# Patient Record
Sex: Female | Born: 1935 | Race: White | Hispanic: No | Smoking: Never smoker
Health system: Southern US, Community
[De-identification: ages and names within clinical notes are randomized; demographics above are authoritative.]

## PROBLEM LIST (undated history)

## (undated) DIAGNOSIS — E669 Obesity, unspecified: Secondary | ICD-10-CM

## (undated) DIAGNOSIS — D696 Thrombocytopenia, unspecified: Secondary | ICD-10-CM

## (undated) DIAGNOSIS — G4730 Sleep apnea, unspecified: Secondary | ICD-10-CM

## (undated) DIAGNOSIS — D649 Anemia, unspecified: Secondary | ICD-10-CM

## (undated) DIAGNOSIS — M1990 Unspecified osteoarthritis, unspecified site: Secondary | ICD-10-CM

## (undated) DIAGNOSIS — M069 Rheumatoid arthritis, unspecified: Secondary | ICD-10-CM

## (undated) DIAGNOSIS — K5792 Diverticulitis of intestine, part unspecified, without perforation or abscess without bleeding: Secondary | ICD-10-CM

## (undated) DIAGNOSIS — I10 Essential (primary) hypertension: Secondary | ICD-10-CM

## (undated) DIAGNOSIS — D126 Benign neoplasm of colon, unspecified: Secondary | ICD-10-CM

## (undated) DIAGNOSIS — I509 Heart failure, unspecified: Secondary | ICD-10-CM

## (undated) DIAGNOSIS — K219 Gastro-esophageal reflux disease without esophagitis: Secondary | ICD-10-CM

## (undated) DIAGNOSIS — E11319 Type 2 diabetes mellitus with unspecified diabetic retinopathy without macular edema: Secondary | ICD-10-CM

## (undated) DIAGNOSIS — Z87442 Personal history of urinary calculi: Secondary | ICD-10-CM

## (undated) DIAGNOSIS — F419 Anxiety disorder, unspecified: Secondary | ICD-10-CM

## (undated) HISTORY — PX: CATARACT EXTRACTION: SUR2

## (undated) HISTORY — PX: APPENDECTOMY: SHX54

## (undated) HISTORY — PX: CHOLECYSTECTOMY: SHX55

## (undated) HISTORY — DX: Benign neoplasm of colon, unspecified: D12.6

## (undated) HISTORY — DX: Type 2 diabetes mellitus with unspecified diabetic retinopathy without macular edema: E11.319

## (undated) HISTORY — DX: Diverticulitis of intestine, part unspecified, without perforation or abscess without bleeding: K57.92

## (undated) HISTORY — DX: Sleep apnea, unspecified: G47.30

## (undated) HISTORY — DX: Obesity, unspecified: E66.9

## (undated) HISTORY — DX: Anemia, unspecified: D64.9

## (undated) HISTORY — DX: Thrombocytopenia, unspecified: D69.6

## (undated) HISTORY — PX: TONSILLECTOMY: SUR1361

---

## 2001-04-17 ENCOUNTER — Ambulatory Visit (HOSPITAL_COMMUNITY): Admission: RE | Admit: 2001-04-17 | Discharge: 2001-04-17 | Payer: Self-pay | Admitting: Family Medicine

## 2001-04-17 ENCOUNTER — Encounter: Payer: Self-pay | Admitting: Family Medicine

## 2002-05-29 ENCOUNTER — Encounter: Payer: Self-pay | Admitting: Family Medicine

## 2002-05-29 ENCOUNTER — Ambulatory Visit (HOSPITAL_COMMUNITY): Admission: RE | Admit: 2002-05-29 | Discharge: 2002-05-29 | Payer: Self-pay | Admitting: Family Medicine

## 2002-06-13 ENCOUNTER — Ambulatory Visit: Admission: RE | Admit: 2002-06-13 | Discharge: 2002-06-13 | Payer: Self-pay | Admitting: Family Medicine

## 2005-08-26 ENCOUNTER — Ambulatory Visit (HOSPITAL_COMMUNITY): Admission: RE | Admit: 2005-08-26 | Discharge: 2005-08-26 | Payer: Self-pay | Admitting: Family Medicine

## 2006-07-25 ENCOUNTER — Ambulatory Visit (HOSPITAL_COMMUNITY): Admission: RE | Admit: 2006-07-25 | Discharge: 2006-07-25 | Payer: Self-pay | Admitting: Family Medicine

## 2006-09-26 ENCOUNTER — Ambulatory Visit (HOSPITAL_COMMUNITY): Admission: RE | Admit: 2006-09-26 | Discharge: 2006-09-26 | Payer: Self-pay | Admitting: Family Medicine

## 2007-08-09 HISTORY — PX: COLONOSCOPY: SHX174

## 2007-08-29 ENCOUNTER — Ambulatory Visit: Payer: Self-pay | Admitting: Internal Medicine

## 2007-08-29 ENCOUNTER — Ambulatory Visit (HOSPITAL_COMMUNITY): Admission: RE | Admit: 2007-08-29 | Discharge: 2007-08-29 | Payer: Self-pay | Admitting: Internal Medicine

## 2007-08-29 ENCOUNTER — Encounter: Payer: Self-pay | Admitting: Internal Medicine

## 2007-11-01 ENCOUNTER — Ambulatory Visit (HOSPITAL_COMMUNITY): Admission: RE | Admit: 2007-11-01 | Discharge: 2007-11-01 | Payer: Self-pay | Admitting: Family Medicine

## 2007-11-28 ENCOUNTER — Ambulatory Visit (HOSPITAL_COMMUNITY): Admission: RE | Admit: 2007-11-28 | Discharge: 2007-11-28 | Payer: Self-pay | Admitting: Family Medicine

## 2008-05-27 ENCOUNTER — Ambulatory Visit (HOSPITAL_COMMUNITY): Admission: RE | Admit: 2008-05-27 | Discharge: 2008-05-27 | Payer: Self-pay | Admitting: Ophthalmology

## 2008-09-22 ENCOUNTER — Emergency Department (HOSPITAL_COMMUNITY): Admission: EM | Admit: 2008-09-22 | Discharge: 2008-09-22 | Payer: Self-pay | Admitting: Emergency Medicine

## 2008-11-20 ENCOUNTER — Ambulatory Visit (HOSPITAL_COMMUNITY): Admission: RE | Admit: 2008-11-20 | Discharge: 2008-11-20 | Payer: Self-pay | Admitting: Family Medicine

## 2009-12-15 ENCOUNTER — Ambulatory Visit (HOSPITAL_COMMUNITY): Admission: RE | Admit: 2009-12-15 | Discharge: 2009-12-15 | Payer: Self-pay | Admitting: Family Medicine

## 2010-06-12 ENCOUNTER — Other Ambulatory Visit (HOSPITAL_COMMUNITY): Payer: Self-pay | Admitting: Family Medicine

## 2010-06-12 DIAGNOSIS — M8580 Other specified disorders of bone density and structure, unspecified site: Secondary | ICD-10-CM

## 2010-06-17 ENCOUNTER — Encounter (HOSPITAL_COMMUNITY): Payer: Self-pay

## 2010-06-17 ENCOUNTER — Ambulatory Visit (HOSPITAL_COMMUNITY)
Admission: RE | Admit: 2010-06-17 | Discharge: 2010-06-17 | Disposition: A | Discharge status: Home / Self Care | Payer: Medicare Other | Source: Ambulatory Visit | Attending: Family Medicine | Admitting: Family Medicine

## 2010-06-17 DIAGNOSIS — Z780 Asymptomatic menopausal state: Secondary | ICD-10-CM | POA: Insufficient documentation

## 2010-06-17 DIAGNOSIS — Z13820 Encounter for screening for osteoporosis: Secondary | ICD-10-CM | POA: Insufficient documentation

## 2010-06-17 DIAGNOSIS — M8580 Other specified disorders of bone density and structure, unspecified site: Secondary | ICD-10-CM

## 2010-06-17 HISTORY — DX: Heart failure, unspecified: I50.9

## 2010-06-17 HISTORY — DX: Essential (primary) hypertension: I10

## 2010-06-24 ENCOUNTER — Emergency Department (HOSPITAL_COMMUNITY): Payer: Medicare Other

## 2010-06-24 ENCOUNTER — Emergency Department (HOSPITAL_COMMUNITY)
Admission: EM | Admit: 2010-06-24 | Discharge: 2010-06-24 | Disposition: A | Discharge status: Home / Self Care | Payer: Medicare Other | Attending: Emergency Medicine | Admitting: Emergency Medicine

## 2010-06-24 DIAGNOSIS — R110 Nausea: Secondary | ICD-10-CM | POA: Insufficient documentation

## 2010-06-24 DIAGNOSIS — R109 Unspecified abdominal pain: Secondary | ICD-10-CM | POA: Insufficient documentation

## 2010-06-24 DIAGNOSIS — E7800 Pure hypercholesterolemia, unspecified: Secondary | ICD-10-CM | POA: Insufficient documentation

## 2010-06-24 DIAGNOSIS — I10 Essential (primary) hypertension: Secondary | ICD-10-CM | POA: Insufficient documentation

## 2010-06-24 DIAGNOSIS — E1169 Type 2 diabetes mellitus with other specified complication: Secondary | ICD-10-CM | POA: Insufficient documentation

## 2010-06-24 DIAGNOSIS — Z79899 Other long term (current) drug therapy: Secondary | ICD-10-CM | POA: Insufficient documentation

## 2010-06-24 DIAGNOSIS — Z87442 Personal history of urinary calculi: Secondary | ICD-10-CM | POA: Insufficient documentation

## 2010-06-24 LAB — URINALYSIS, ROUTINE W REFLEX MICROSCOPIC
Bilirubin Urine: NEGATIVE
Nitrite: NEGATIVE
Specific Gravity, Urine: >1.03 — ABNORMAL HIGH (ref 1.005–1.030)
Urobilinogen, UA: 0.2 mg/dL (ref 0.0–1.0)

## 2010-06-24 LAB — COMPREHENSIVE METABOLIC PANEL
AST: 25 U/L (ref 0–37)
BUN: 15 mg/dL (ref 6–23)
CO2: 27 mEq/L (ref 19–32)
Calcium: 8.9 mg/dL (ref 8.4–10.5)
Chloride: 106 mEq/L (ref 96–112)
Creatinine, Ser: 0.79 mg/dL (ref 0.4–1.2)
GFR calc Af Amer: >60 mL/min (ref >60)
GFR calc non Af Amer: >60 mL/min (ref >60)
Glucose, Bld: 157 mg/dL — ABNORMAL HIGH (ref 70–99)
Total Bilirubin: 0.7 mg/dL (ref 0.3–1.2)

## 2010-06-24 LAB — CBC
HCT: 37.4 % (ref 36.0–46.0)
Hemoglobin: 12.5 g/dL (ref 12.0–15.0)
MCH: 29.1 pg (ref 26.0–34.0)
MCHC: 33.4 g/dL (ref 30.0–36.0)
MCV: 87.2 fL (ref 78.0–100.0)
RBC: 4.29 MIL/uL (ref 3.87–5.11)

## 2010-06-24 LAB — DIFFERENTIAL
Lymphocytes Relative: 18 % (ref 12–46)
Lymphs Abs: 1.4 10*3/uL (ref 0.7–4.0)
Monocytes Absolute: 0.3 10*3/uL (ref 0.1–1.0)
Monocytes Relative: 4 % (ref 3–12)
Neutro Abs: 5.9 10*3/uL (ref 1.7–7.7)
Neutrophils Relative %: 76 % (ref 43–77)

## 2010-08-18 LAB — URINE CULTURE: Colony Count: NO GROWTH

## 2010-08-18 LAB — COMPREHENSIVE METABOLIC PANEL
ALT: 25 U/L (ref 0–35)
AST: 23 U/L (ref 0–37)
Albumin: 3.7 g/dL (ref 3.5–5.2)
Alkaline Phosphatase: 51 U/L (ref 39–117)
Chloride: 101 mEq/L (ref 96–112)
GFR calc Af Amer: >60 mL/min (ref >60)
Potassium: 4.1 mEq/L (ref 3.5–5.1)
Sodium: 136 mEq/L (ref 135–145)
Total Bilirubin: 0.6 mg/dL (ref 0.3–1.2)

## 2010-08-18 LAB — URINALYSIS, ROUTINE W REFLEX MICROSCOPIC
Bilirubin Urine: NEGATIVE
Nitrite: NEGATIVE
Protein, ur: NEGATIVE mg/dL
Urobilinogen, UA: 0.2 mg/dL (ref 0.0–1.0)

## 2010-08-18 LAB — CBC
Platelets: 116 10*3/uL — ABNORMAL LOW (ref 150–400)
WBC: 10.3 10*3/uL (ref 4.0–10.5)

## 2010-08-18 LAB — DIFFERENTIAL
Basophils Absolute: 0 10*3/uL (ref 0.0–0.1)
Basophils Relative: 0 % (ref 0–1)
Eosinophils Relative: 0 % (ref 0–5)
Lymphocytes Relative: 11 % — ABNORMAL LOW (ref 12–46)
Monocytes Absolute: 0.4 10*3/uL (ref 0.1–1.0)

## 2010-08-24 LAB — BASIC METABOLIC PANEL
BUN: 18 mg/dL (ref 6–23)
GFR calc non Af Amer: >60 mL/min (ref >60)
Glucose, Bld: 196 mg/dL — ABNORMAL HIGH (ref 70–99)
Potassium: 4 mEq/L (ref 3.5–5.1)

## 2010-09-22 NOTE — Op Note (Signed)
NAME:  PAYSON, CRUMBY                ACCOUNT NO.:  [id number]   MEDICAL RECORD NO.:  [id number]          PATIENT TYPE:  AMB   LOCATION:  DAY                           FACILITY:  [facility]   PHYSICIAN:  R. Roetta Sessions, M.D. DATE OF BIRTH:  1935-02-28   DATE OF PROCEDURE:  08/29/2007  DATE OF DISCHARGE:                               OPERATIVE REPORT   INDICATIONS FOR PROCEDURE:  A 76 year old lady who reportedly underwent  colonoscopy with polypectomy in [place], [place], 11 years ago.  She  has no lower GI tract symptoms.  She has not had a colonoscopy since  that time.  There is no family history of colon cancer.  Colonoscopy is  now being done as a surveillance maneuver.  This approach has been  discussed with the patient at length.  Potential risks, benefits, and  alternatives have been reviewed and questions answered.  She is  agreeable.  Please see documentation in medical record.   PROCEDURE NOTE:  O2 saturation, blood pressure, and pulse oximetry  monitored throughout the entire procedure.   CONSCIOUS SEDATION:  Versed 5 mg IV, Demerol 75 mg IV in divided doses.   INSTRUMENT:  Pentax video chip system.   FINDINGS:  Digital rectal exam revealed no abnormalities.   ENDOSCOPIC FINDINGS:  The prep was good.  Colon:  Colonic mucosa was surveyed from the rectosigmoid junction  through the left, transverse and right colon to the appendiceal orifice,  ileocecal valve and cecum.  These structures were well seen and  photographed for the record.  From this level, the scope was slowly  withdrawn.  All previously mentioned mucosal surfaces were again seen.  The patient had relatively deep haustrations.  The patient had  pancolonic diverticula.  There was a 5-mm polyp at the splenic flexure  which was cold snared, destroyed/residual tissue lost with this  maneuver.  No sample for the pathologist.  There was a diminutive polyp  just distal to the ileocecal valve which was cold  biopsied/removed.  The  remainder of the colonic mucosa appeared normal.  The scope was pulled  down to the rectum where thorough examination of the rectal mucosa  including retroflex view of the anal verge demonstrated no  abnormalities.  The patient tolerated the procedure well and and was  reactive in endoscopy.   IMPRESSION:  1. Normal rectum.  2. Pancolonic diverticula.  3. Polyp at splenic flexure removed with snare but no tissue for the      pathologist, diminutive polyp just distal to the ileocecal valve,      cold biopsy/remainder of colonic mucosa appeared normal.   RECOMMENDATIONS:  1. Diverticulosis literature provided to Ms. Saal.  2. Follow up on path.  3. Make further recommendations in terms of her next colonoscopy based      on path findings.  Again, we have no information on the histology,      size, etc., of any polyps previously removed.      Jonathon Bellows, M.D.  Electronically Signed     RMR/MEDQ  D:  08/29/2007  T:  08/29/2007  Job:  [id number]   cc:   Lorin Picket A. Gerda Diss, MD  Fax: [phone]

## 2010-09-24 ENCOUNTER — Encounter (HOSPITAL_COMMUNITY): Payer: Self-pay

## 2010-09-24 ENCOUNTER — Ambulatory Visit (HOSPITAL_COMMUNITY)
Admission: RE | Admit: 2010-09-24 | Discharge: 2010-09-24 | Disposition: A | Discharge status: Home / Self Care | Payer: Medicare Other | Source: Ambulatory Visit | Attending: Family Medicine | Admitting: Family Medicine

## 2010-09-24 ENCOUNTER — Other Ambulatory Visit: Payer: Self-pay | Admitting: Family Medicine

## 2010-09-24 DIAGNOSIS — R0600 Dyspnea, unspecified: Secondary | ICD-10-CM

## 2010-09-24 DIAGNOSIS — R0609 Other forms of dyspnea: Secondary | ICD-10-CM | POA: Insufficient documentation

## 2010-09-24 DIAGNOSIS — R0989 Other specified symptoms and signs involving the circulatory and respiratory systems: Secondary | ICD-10-CM | POA: Insufficient documentation

## 2010-09-24 DIAGNOSIS — I10 Essential (primary) hypertension: Secondary | ICD-10-CM | POA: Insufficient documentation

## 2011-05-24 DIAGNOSIS — E11339 Type 2 diabetes mellitus with moderate nonproliferative diabetic retinopathy without macular edema: Secondary | ICD-10-CM | POA: Diagnosis not present

## 2011-05-24 DIAGNOSIS — E11311 Type 2 diabetes mellitus with unspecified diabetic retinopathy with macular edema: Secondary | ICD-10-CM | POA: Diagnosis not present

## 2011-05-24 DIAGNOSIS — E1139 Type 2 diabetes mellitus with other diabetic ophthalmic complication: Secondary | ICD-10-CM | POA: Diagnosis not present

## 2011-05-24 DIAGNOSIS — H35359 Cystoid macular degeneration, unspecified eye: Secondary | ICD-10-CM | POA: Diagnosis not present

## 2011-05-31 DIAGNOSIS — J069 Acute upper respiratory infection, unspecified: Secondary | ICD-10-CM | POA: Diagnosis not present

## 2011-05-31 DIAGNOSIS — J40 Bronchitis, not specified as acute or chronic: Secondary | ICD-10-CM | POA: Diagnosis not present

## 2011-06-10 DIAGNOSIS — J40 Bronchitis, not specified as acute or chronic: Secondary | ICD-10-CM | POA: Diagnosis not present

## 2011-06-10 DIAGNOSIS — E785 Hyperlipidemia, unspecified: Secondary | ICD-10-CM | POA: Diagnosis not present

## 2011-06-10 DIAGNOSIS — E119 Type 2 diabetes mellitus without complications: Secondary | ICD-10-CM | POA: Diagnosis not present

## 2011-06-21 DIAGNOSIS — E11311 Type 2 diabetes mellitus with unspecified diabetic retinopathy with macular edema: Secondary | ICD-10-CM | POA: Diagnosis not present

## 2011-07-05 DIAGNOSIS — E11339 Type 2 diabetes mellitus with moderate nonproliferative diabetic retinopathy without macular edema: Secondary | ICD-10-CM | POA: Diagnosis not present

## 2011-07-05 DIAGNOSIS — H35359 Cystoid macular degeneration, unspecified eye: Secondary | ICD-10-CM | POA: Diagnosis not present

## 2011-07-05 DIAGNOSIS — E1139 Type 2 diabetes mellitus with other diabetic ophthalmic complication: Secondary | ICD-10-CM | POA: Diagnosis not present

## 2011-07-05 DIAGNOSIS — E11311 Type 2 diabetes mellitus with unspecified diabetic retinopathy with macular edema: Secondary | ICD-10-CM | POA: Diagnosis not present

## 2011-07-23 ENCOUNTER — Encounter (HOSPITAL_COMMUNITY): Payer: Self-pay

## 2011-07-23 ENCOUNTER — Emergency Department (HOSPITAL_COMMUNITY): Payer: Medicare Other

## 2011-07-23 ENCOUNTER — Inpatient Hospital Stay (HOSPITAL_COMMUNITY)
Admission: EM | Admit: 2011-07-23 | Discharge: 2011-07-26 | DRG: 378 | Disposition: A | Discharge status: Home / Self Care | Payer: Medicare Other | Attending: Internal Medicine | Admitting: Internal Medicine

## 2011-07-23 DIAGNOSIS — M47817 Spondylosis without myelopathy or radiculopathy, lumbosacral region: Secondary | ICD-10-CM | POA: Diagnosis present

## 2011-07-23 DIAGNOSIS — Z87442 Personal history of urinary calculi: Secondary | ICD-10-CM

## 2011-07-23 DIAGNOSIS — D649 Anemia, unspecified: Secondary | ICD-10-CM

## 2011-07-23 DIAGNOSIS — I10 Essential (primary) hypertension: Secondary | ICD-10-CM | POA: Diagnosis not present

## 2011-07-23 DIAGNOSIS — K5792 Diverticulitis of intestine, part unspecified, without perforation or abscess without bleeding: Secondary | ICD-10-CM | POA: Diagnosis present

## 2011-07-23 DIAGNOSIS — R935 Abnormal findings on diagnostic imaging of other abdominal regions, including retroperitoneum: Secondary | ICD-10-CM | POA: Diagnosis not present

## 2011-07-23 DIAGNOSIS — M47814 Spondylosis without myelopathy or radiculopathy, thoracic region: Secondary | ICD-10-CM | POA: Diagnosis present

## 2011-07-23 DIAGNOSIS — D62 Acute posthemorrhagic anemia: Secondary | ICD-10-CM | POA: Diagnosis not present

## 2011-07-23 DIAGNOSIS — K219 Gastro-esophageal reflux disease without esophagitis: Secondary | ICD-10-CM | POA: Diagnosis present

## 2011-07-23 DIAGNOSIS — D126 Benign neoplasm of colon, unspecified: Secondary | ICD-10-CM | POA: Diagnosis present

## 2011-07-23 DIAGNOSIS — E785 Hyperlipidemia, unspecified: Secondary | ICD-10-CM | POA: Diagnosis present

## 2011-07-23 DIAGNOSIS — Z79899 Other long term (current) drug therapy: Secondary | ICD-10-CM

## 2011-07-23 DIAGNOSIS — K635 Polyp of colon: Secondary | ICD-10-CM | POA: Diagnosis present

## 2011-07-23 DIAGNOSIS — N179 Acute kidney failure, unspecified: Secondary | ICD-10-CM | POA: Diagnosis present

## 2011-07-23 DIAGNOSIS — I509 Heart failure, unspecified: Secondary | ICD-10-CM | POA: Diagnosis present

## 2011-07-23 DIAGNOSIS — Z6836 Body mass index (BMI) 36.0-36.9, adult: Secondary | ICD-10-CM | POA: Diagnosis not present

## 2011-07-23 DIAGNOSIS — K5732 Diverticulitis of large intestine without perforation or abscess without bleeding: Secondary | ICD-10-CM | POA: Diagnosis not present

## 2011-07-23 DIAGNOSIS — R112 Nausea with vomiting, unspecified: Secondary | ICD-10-CM | POA: Diagnosis present

## 2011-07-23 DIAGNOSIS — K625 Hemorrhage of anus and rectum: Secondary | ICD-10-CM | POA: Diagnosis not present

## 2011-07-23 DIAGNOSIS — K922 Gastrointestinal hemorrhage, unspecified: Secondary | ICD-10-CM | POA: Diagnosis not present

## 2011-07-23 DIAGNOSIS — K5712 Diverticulitis of small intestine without perforation or abscess without bleeding: Secondary | ICD-10-CM | POA: Diagnosis not present

## 2011-07-23 DIAGNOSIS — M069 Rheumatoid arthritis, unspecified: Secondary | ICD-10-CM | POA: Diagnosis present

## 2011-07-23 DIAGNOSIS — Z7982 Long term (current) use of aspirin: Secondary | ICD-10-CM

## 2011-07-23 DIAGNOSIS — D696 Thrombocytopenia, unspecified: Secondary | ICD-10-CM | POA: Diagnosis not present

## 2011-07-23 DIAGNOSIS — R1032 Left lower quadrant pain: Secondary | ICD-10-CM | POA: Diagnosis not present

## 2011-07-23 DIAGNOSIS — K644 Residual hemorrhoidal skin tags: Secondary | ICD-10-CM | POA: Diagnosis present

## 2011-07-23 DIAGNOSIS — Z794 Long term (current) use of insulin: Secondary | ICD-10-CM

## 2011-07-23 DIAGNOSIS — I498 Other specified cardiac arrhythmias: Secondary | ICD-10-CM | POA: Diagnosis not present

## 2011-07-23 DIAGNOSIS — R001 Bradycardia, unspecified: Secondary | ICD-10-CM | POA: Diagnosis present

## 2011-07-23 DIAGNOSIS — K5733 Diverticulitis of large intestine without perforation or abscess with bleeding: Secondary | ICD-10-CM | POA: Diagnosis present

## 2011-07-23 DIAGNOSIS — E669 Obesity, unspecified: Secondary | ICD-10-CM | POA: Diagnosis present

## 2011-07-23 DIAGNOSIS — E119 Type 2 diabetes mellitus without complications: Secondary | ICD-10-CM | POA: Diagnosis not present

## 2011-07-23 DIAGNOSIS — D259 Leiomyoma of uterus, unspecified: Secondary | ICD-10-CM | POA: Diagnosis not present

## 2011-07-23 HISTORY — DX: Rheumatoid arthritis, unspecified: M06.9

## 2011-07-23 HISTORY — DX: Gastro-esophageal reflux disease without esophagitis: K21.9

## 2011-07-23 LAB — HEPATIC FUNCTION PANEL
ALT: 10 U/L (ref 0–35)
Albumin: 3.2 g/dL — ABNORMAL LOW (ref 3.5–5.2)
Alkaline Phosphatase: 51 U/L (ref 39–117)
Total Bilirubin: 0.3 mg/dL (ref 0.3–1.2)
Total Protein: 5.9 g/dL — ABNORMAL LOW (ref 6.0–8.3)

## 2011-07-23 LAB — DIFFERENTIAL
Basophils Absolute: 0 10*3/uL (ref 0.0–0.1)
Basophils Relative: 0 % (ref 0–1)
Neutro Abs: 8.5 10*3/uL — ABNORMAL HIGH (ref 1.7–7.7)
Neutrophils Relative %: 79 % — ABNORMAL HIGH (ref 43–77)

## 2011-07-23 LAB — CBC
Hemoglobin: 9.3 g/dL — ABNORMAL LOW (ref 12.0–15.0)
MCHC: 33 g/dL (ref 30.0–36.0)
Platelets: 158 10*3/uL (ref 150–400)
RDW: 14.3 % (ref 11.5–15.5)

## 2011-07-23 LAB — GLUCOSE, CAPILLARY
Glucose-Capillary: 90 mg/dL (ref 70–99)
Glucose-Capillary: 104 mg/dL — ABNORMAL HIGH (ref 70–99)

## 2011-07-23 LAB — BASIC METABOLIC PANEL
Chloride: 100 mEq/L (ref 96–112)
GFR calc Af Amer: 55 mL/min — ABNORMAL LOW (ref >90)
GFR calc non Af Amer: 47 mL/min — ABNORMAL LOW (ref >90)
Potassium: 4.6 mEq/L (ref 3.5–5.1)

## 2011-07-23 LAB — OCCULT BLOOD, POC DEVICE: Fecal Occult Bld: POSITIVE

## 2011-07-23 LAB — HEMOGLOBIN AND HEMATOCRIT, BLOOD
HCT: 26.8 % — ABNORMAL LOW (ref 36.0–46.0)
HCT: 24.3 % — ABNORMAL LOW (ref 36.0–46.0)
Hemoglobin: 8.8 g/dL — ABNORMAL LOW (ref 12.0–15.0)
Hemoglobin: 8.1 g/dL — ABNORMAL LOW (ref 12.0–15.0)

## 2011-07-23 LAB — TYPE AND SCREEN: ABO/RH(D): A NEG

## 2011-07-23 LAB — LIPASE, BLOOD: Lipase: 24 U/L (ref 11–59)

## 2011-07-23 LAB — APTT: aPTT: 32 seconds (ref 24–37)

## 2011-07-23 MED ORDER — INSULIN GLARGINE 100 UNIT/ML ~~LOC~~ SOLN
20.0000 [IU] | Freq: Every day | SUBCUTANEOUS | Status: DC
  Administered 2011-07-23 – 2011-07-25 (×3): 20 [IU] via SUBCUTANEOUS

## 2011-07-23 MED ORDER — CITALOPRAM HYDROBROMIDE 20 MG PO TABS
20.0000 mg | ORAL_TABLET | Freq: Every day | ORAL | Status: DC
  Administered 2011-07-24 – 2011-07-26 (×3): 20 mg via ORAL
  Filled 2011-07-23 (×4): qty 1

## 2011-07-23 MED ORDER — ACETAMINOPHEN 325 MG PO TABS: 650.0000 mg | ORAL_TABLET | Freq: Four times a day (QID) | ORAL | Status: DC | PRN

## 2011-07-23 MED ORDER — ONDANSETRON HCL 4 MG/2ML IJ SOLN: 4.0000 mg | Freq: Four times a day (QID) | INTRAMUSCULAR | Status: DC | PRN

## 2011-07-23 MED ORDER — IOHEXOL 300 MG/ML  SOLN
100.0000 mL | Freq: Once | INTRAMUSCULAR | Status: AC | PRN
  Administered 2011-07-23: 100 mL via INTRAVENOUS

## 2011-07-23 MED ORDER — SODIUM CHLORIDE 0.9 % IV SOLN
INTRAVENOUS | Status: DC
  Administered 2011-07-23 – 2011-07-25 (×4): via INTRAVENOUS

## 2011-07-23 MED ORDER — OXYBUTYNIN CHLORIDE ER 5 MG PO TB24
5.0000 mg | ORAL_TABLET | Freq: Every day | ORAL | Status: DC
  Administered 2011-07-24 – 2011-07-26 (×3): 5 mg via ORAL
  Filled 2011-07-23 (×3): qty 1

## 2011-07-23 MED ORDER — ONDANSETRON HCL 4 MG/2ML IJ SOLN
4.0000 mg | Freq: Once | INTRAMUSCULAR | Status: AC
  Administered 2011-07-23: 4 mg via INTRAVENOUS
  Filled 2011-07-23: qty 2

## 2011-07-23 MED ORDER — MORPHINE SULFATE 4 MG/ML IJ SOLN
4.0000 mg | Freq: Once | INTRAMUSCULAR | Status: AC
  Administered 2011-07-23: 4 mg via INTRAVENOUS
  Filled 2011-07-23: qty 1

## 2011-07-23 MED ORDER — SODIUM CHLORIDE 0.9 % IJ SOLN
3.0000 mL | Freq: Two times a day (BID) | INTRAMUSCULAR | Status: DC
  Administered 2011-07-23 – 2011-07-25 (×4): 3 mL via INTRAVENOUS
  Filled 2011-07-23 (×2): qty 3

## 2011-07-23 MED ORDER — SODIUM CHLORIDE 0.9 % IJ SOLN
INTRAMUSCULAR | Status: AC
  Filled 2011-07-23: qty 3

## 2011-07-23 MED ORDER — SIMVASTATIN 20 MG PO TABS
40.0000 mg | ORAL_TABLET | Freq: Every day | ORAL | Status: DC
  Administered 2011-07-24 – 2011-07-25 (×2): 40 mg via ORAL
  Filled 2011-07-23 (×2): qty 2

## 2011-07-23 MED ORDER — PANTOPRAZOLE SODIUM 40 MG IV SOLR
40.0000 mg | INTRAVENOUS | Status: DC
  Administered 2011-07-23: 40 mg via INTRAVENOUS
  Filled 2011-07-23: qty 40

## 2011-07-23 MED ORDER — HYDROMORPHONE HCL PF 1 MG/ML IJ SOLN: 0.5000 mg | INTRAMUSCULAR | Status: DC | PRN

## 2011-07-23 MED ORDER — ONDANSETRON HCL 4 MG PO TABS: 4.0000 mg | ORAL_TABLET | Freq: Four times a day (QID) | ORAL | Status: DC | PRN

## 2011-07-23 MED ORDER — ACETAMINOPHEN 650 MG RE SUPP: 650.0000 mg | Freq: Four times a day (QID) | RECTAL | Status: DC | PRN

## 2011-07-23 MED ORDER — IOHEXOL 300 MG/ML  SOLN
40.0000 mL | Freq: Once | INTRAMUSCULAR | Status: AC | PRN
  Administered 2011-07-23: 40 mL via ORAL

## 2011-07-23 MED ORDER — TRAZODONE HCL 50 MG PO TABS: 50.0000 mg | ORAL_TABLET | Freq: Every evening | ORAL | Status: DC | PRN

## 2011-07-23 NOTE — ED Notes (Signed)
Reports diarrhea and noted blood in stool at that time, vomiting x2-3 during the night, denies any fever.

## 2011-07-23 NOTE — ED Provider Notes (Signed)
This chart was scribed for Shelly Guppy, MD by Williemae Natter. The patient was seen in room [facility] at 11:07 AM.  CSN: [id number]  Arrival date & time 07/23/11  1011   First MD Initiated Contact with Patient 07/23/11 1057      Chief Complaint  Patient presents with  . Rectal Bleeding  . Emesis    (Consider location/radiation/quality/duration/timing/severity/associated sxs/prior treatment) Patient is a 77 y.o. female presenting with hematochezia.  Rectal Bleeding  The current episode started today. The onset was sudden. The problem occurs frequently. The problem has been unchanged. The pain is moderate. The stool is described as liquid and bloody. There was no prior successful therapy. There was no prior unsuccessful therapy. Associated symptoms include abdominal pain and diarrhea. She has been behaving normally. There were no sick contacts.   Shelly Jackson is a 77 y.o. female with a history of abdominal surgeries, heart failure, diabetes and htn who presents to the Emergency Department complaining of rectal bleeding. Pt started started passing blood through rectum at around 3 am today. Pt reports associated abdominal pain. Pt reports some diarrhea, nausea and vomiting last night now resolved. Pt denies any fever or rashes but has some shortness of breath and lightheadedness. Pt denies any kidney problems or history of stomach ulcers. PCP Dr. Gerda Diss Past Medical History  Diagnosis Date  . CHF (congestive heart failure)   . Diabetes mellitus   . Hypertension   . Acid reflux   . Rheumatoid arthritis     Past Surgical History  Procedure Date  . Cholecystectomy   . Appendectomy     No family history on file.  History  Substance Use Topics  . Smoking status: Never Smoker   . Smokeless tobacco: Not on file  . Alcohol Use: No    OB History    Grav Para Term Preterm Abortions TAB SAB Ect Mult Living                  Review of Systems  Gastrointestinal: Positive  for abdominal pain, diarrhea and hematochezia.   10 Systems reviewed and are negative for acute change except as noted in the HPI.  Allergies  Review of patient's allergies indicates no known allergies.  Home Medications  No current outpatient prescriptions on file.  BP 107/35  Temp(Src) 98.1 F (36.7 C) (Oral)  Resp 18  Ht 5\' 3"  (1.6 m)  Wt 207 lb (93.895 kg)  BMI 36.67 kg/m2  SpO2 97%  Physical Exam  Nursing note and vitals reviewed. Constitutional: She is oriented to person, place, and time. She appears well-developed and well-nourished.  HENT:  Head: Normocephalic and atraumatic.  Eyes: Conjunctivae are normal.  Neck: Normal range of motion. Neck supple.       No bruit on left or right  Cardiovascular: Normal rate and regular rhythm.   Murmur heard. Pulmonary/Chest: Effort normal and breath sounds normal.       Lungs are clear and equal  Abdominal: There is tenderness (RUQ and epigastrium and all across lower abdomen).  Genitourinary:       Bright red blood found in rectal exam. Rectal exam performed with nurse chaperone.   Musculoskeletal: Normal range of motion. She exhibits no tenderness.  Neurological: She is alert and oriented to person, place, and time.  Skin: Skin is warm and dry. There is pallor.       Capillary refill normal  Psychiatric: She has a normal mood and affect. Her behavior is normal.  ED Course  Procedures (including critical care time) 77 year old, female, presents emergency department with lower abdominal pain, as well as hematochezia.  Since yesterday.  She has no orthostatic symptoms.  She has not hypotension or tachycardia.  We will establish an IV treat her symptoms and perform further evaluation, including a CAT scan of her abdomen.  For an attempt to identify the etiology for her symptoms DIAGNOSTIC STUDIES: Oxygen Saturation is 97% on room air, normal by my interpretation.    COORDINATION OF CARE:  Medications  0.9 %  sodium  chloride infusion (not administered)      Labs Reviewed  CBC - Abnormal; Notable for the following:    WBC 10.8 (*)    RBC 3.25 (*)    Hemoglobin 9.3 (*)    HCT 28.2 (*)    All other components within normal limits  DIFFERENTIAL - Abnormal; Notable for the following:    Neutrophils Relative 79 (*)    Neutro Abs 8.5 (*)    All other components within normal limits  BASIC METABOLIC PANEL   No results found.   No diagnosis found.    MDM  hematechezia I personally performed the services described in this documentation, which was scribed in my presence. The recorded information has been reviewed and considered.          Shelly Guppy, MD 07/24/11 [phone]

## 2011-07-23 NOTE — Progress Notes (Signed)
Paged Dr. Rito Ehrlich with h/h results. Was 9.3/28.2 and is now 8.8/26.8. No active bleeding noted at this time, patient denies any abdominal pain

## 2011-07-23 NOTE — ED Notes (Signed)
Pt c/o epigastric and lower abdominal pain, bright red rectal bleeding, and weakness. Pt a/ox4. Resp even and unlabored. NAD at this time. Pt stable at this time. Pt pale and weak. Pt placed on cardiac monitor. IV established by previous nurse. Family in room with pt. Pt lying supine in stretcher. Stretcher in low locked position. Side rail up for pt safety. Call light within reach. Education on plan of care provided. Pt verbalized understanding. Awaiting further orders. Will continue to monitor.

## 2011-07-23 NOTE — ED Notes (Signed)
Pt ambulated to BR with steady gate. NAD at this time. Pt stable. Pt placed back on cardiac monitor. IVF continued and IV site benign. No swelling, bleeding, or redness noted. Awaiting hospitalist eval and orders. Will continue to monitor.

## 2011-07-23 NOTE — H&P (Addendum)
Hospital Admission Note Date: 07/23/2011  Patient name: Shelly Jackson Medical record number: [id number] Date of birth: 1934/05/18 Age: 77 y.o. Gender: female PCP: Lilyan Punt, MD, MD  Attending physician: Christiane Ha, MD  Chief Complaint: Vomiting and rectal bleeding  History of Present Illness:  Shelly Jackson is an 77 y.o. female who presents with about a 24-hour history of vomiting and bright red blood per rectum. She had an egg salad sandwich yesterday. Vomiting started in the afternoon. She had no hematemesis. Subsequently, she started having bright red blood per rectum with clots. Lower abdominal pain. No fevers chills or sweats. No sick contacts. No recent travel. She has had a colonoscopy in 2009 which showed pan diverticulosis. Dr. Jena Gauss did this. The patient reports that she thinks she may have had another procedure since then but I do not see it in the chart. Her blood pressure is not low today. Her hemoglobin is about 9. A year ago, her hemoglobin was about 12. She takes an aspirin a day. She's had no previous similar symptoms.  Past Medical History  Diagnosis Date  . CHF (congestive heart failure)   . Diabetes mellitus   . Hypertension   . Acid reflux   . Rheumatoid arthritis     Meds: See medicine reconciliation  Allergies: Review of patient's allergies indicates no known allergies. History   Social History  . Marital Status: Widowed    Spouse Name: N/A    Number of Children: N/A  . Years of Education: N/A   Occupational History  . Not on file.   Social History Main Topics  . Smoking status: Never Smoker   . Smokeless tobacco: Not on file  . Alcohol Use: No  . Drug Use: No  . Sexually Active: No   Other Topics Concern  . Not on file   Social History Narrative  . No narrative on file   No family history on file. Past Surgical History  Procedure Date  . Cholecystectomy   . Appendectomy     Review of Systems: Systems reviewed and as per  HPI, otherwise negative.  Physical Exam: Blood pressure 130/61, pulse 59, temperature 98.1 F (36.7 C), temperature source Oral, resp. rate 20, height 5\' 3"  (1.6 m), weight 93.895 kg (207 lb), SpO2 93.00%. BP 130/61  Pulse 59  Temp(Src) 98.1 F (36.7 C) (Oral)  Resp 20  Ht 5\' 3"  (1.6 m)  Wt 93.895 kg (207 lb)  BMI 36.67 kg/m2  SpO2 93%  General Appearance:    Alert, cooperative, no distress, appears stated age.obese   Head:    Normocephalic, without obvious abnormality, atraumatic  Eyes:    PERRL, conjunctiva/corneas clear, EOM's intact, fundi    benign, both eyes  Ears:    Normal TM's and external ear canals, both ears  Nose:   Nares normal, septum midline, mucosa normal, no drainage    or sinus tenderness  Throat:   dry mucous membranes   Neck:   Supple, symmetrical, trachea midline, no adenopathy;    thyroid:  no enlargement/tenderness/nodules; no carotid   Bruit.thick. Unable to assess JVD   Back:     Symmetric, no curvature, ROM normal, no CVA tenderness  Lungs:     Clear to auscultation bilaterally, respirations unlabored  Chest Wall:    No tenderness or deformity   Heart:    Regular rate and rhythm, S1 and S2 normal, no murmur, rub   or gallop     Abdomen:  Soft, non-tender, bowel sounds active all four quadrants,    no masses, no organomegaly, obese   Genitalia:   deferred   Rectal:   bright red blood per ED physician   Extremities:   Extremities normal, atraumatic, no cyanosis or edema  Pulses:   2+ and symmetric all extremities  Skin:   Skin color, texture, turgor normal, no rashes or lesions  Lymph nodes:   Cervical, supraclavicular, and axillary nodes normal  Neurologic:   CNII-XII intact, normal strength, sensation and reflexes    throughout    Psychiatric: Normal affect  Lab results: Basic Metabolic Panel:  Basename 07/23/11 1106  NA 136  K 4.6  CL 100  CO2 27  GLUCOSE 170*  BUN 25*  CREATININE 1.12*  CALCIUM 9.0  MG --  PHOS --   Liver  Function Tests: No results found for this basename: AST:2,ALT:2,ALKPHOS:2,BILITOT:2,PROT:2,ALBUMIN:2 in the last 72 hours No results found for this basename: LIPASE:2,AMYLASE:2 in the last 72 hours No results found for this basename: AMMONIA:2 in the last 72 hours CBC:  Basename 07/23/11 1106  WBC 10.8*  NEUTROABS 8.5*  HGB 9.3*  HCT 28.2*  MCV 86.8  PLT 158   Cardiac Enzymes: No results found for this basename: CKTOTAL:3,CKMB:3,CKMBINDEX:3,TROPONINI:3 in the last 72 hours BNP: No results found for this basename: PROBNP:3 in the last 72 hours D-Dimer: No results found for this basename: DDIMER:2 in the last 72 hours CBG: No results found for this basename: GLUCAP:6 in the last 72 hours Hemoglobin A1C: No results found for this basename: HGBA1C in the last 72 hours Fasting Lipid Panel: No results found for this basename: CHOL,HDL,LDLCALC,TRIG,CHOLHDL,LDLDIRECT in the last 72 hours Thyroid Function Tests: No results found for this basename: TSH,T4TOTAL,FREET4,T3FREE,THYROIDAB in the last 72 hours Anemia Panel: No results found for this basename: VITAMINB12,FOLATE,FERRITIN,TIBC,IRON,RETICCTPCT in the last 72 hours Coagulation: No results found for this basename: LABPROT:2,INR:2 in the last 72 hours Urine Drug Screen: Drugs of Abuse  No results found for this basename: labopia, cocainscrnur, labbenz, amphetmu, thcu, labbarb    Alcohol Level: No results found for this basename: ETH:2 in the last 72 hours Urinalysis: No results found for this basename: COLORURINE:2,APPERANCEUR:2,LABSPEC:2,PHURINE:2,GLUCOSEU:2,HGBUR:2,BILIRUBINUR:2,KETONESUR:2,PROTEINUR:2,UROBILINOGEN:2,NITRITE:2,LEUKOCYTESUR:2 in the last 72 hours  Imaging results:  Ct Abdomen Pelvis W Contrast  07/23/2011  *RADIOLOGY REPORT*  Clinical Data: Abdominal pain, GI bleed.  CT ABDOMEN AND PELVIS WITH CONTRAST  Technique:  Multidetector CT imaging of the abdomen and pelvis was performed following the standard protocol  during bolus administration of intravenous contrast.  Contrast: OMNIPAQUE IOHEXOL 300 MG/ML IJ SOLN  Comparison: 06/24/2010  Findings: Heart is borderline enlarged.  Lung bases clear.  No effusions.  Prior cholecystectomy.  Slight intrahepatic biliary ductal prominence likely related to post cholecystectomy state.  No focal abnormality in the liver.  Spleen, pancreas, right adrenal unremarkable.  Fullness of the left adrenal compatible with hyperplasia, stable.  Bilateral renal cysts are noted which appear benign.  Severe sigmoid diverticulosis.  No changes of active diverticulitis.  Calcified fibroids in the uterus.  Prominent left ovary is stable since prior CT.  No right adnexal abnormality. Small bowel is decompressed.  No free fluid, free air or adenopathy.  Aorta is normal caliber. Degenerative changes in the thoracolumbar spine.  IMPRESSION: No acute findings in the abdomen or pelvis.  Stable prominence of the left ovary.  Calcified uterine fibroids.  Prior cholecystectomy.  Colonic diverticulosis.  Original Report Authenticated By: Cyndie Chime, M.D.    Assessment & Plan: Principal Problem:  *  Lower gastrointestinal bleed, with accompanying vomiting and lower abdominal pain. Could be a diverticular bleed, but pain and vomiting are concerning for infectious diarrhea. Will check stool for lactoferrin, C. difficile, culture. Clear liquids. IV fluids. Anti-emetics. Patient is known to Dr. Benard Rink and I have paged him to consult. Patient may need endoscopy.   Acute blood loss anemia: Serial hemoglobins hematocrits, transfuse when necessary. Blood pressure currently not low.  Nausea & vomiting: See above. Empiric IV protonic for now.  Benign hypertension hold antihypertensives  DM type 2 (diabetes mellitus, type 2): Monitor blood glucose. Decrease Lantus dose by 50% for now.  Sinus bradycardia, likely related to metoprolol which will be held. Patient will be monitored on telemetry.   Obesity   Raeqwon Lux L 07/23/2011, 3:14 PM

## 2011-07-23 NOTE — Progress Notes (Signed)
Dr. Rito Ehrlich returned paged, states to call if h/h drops below 8 or if patient has any active bleeding

## 2011-07-24 ENCOUNTER — Encounter (HOSPITAL_COMMUNITY): Payer: Self-pay | Admitting: *Deleted

## 2011-07-24 ENCOUNTER — Encounter (HOSPITAL_COMMUNITY)
Admission: EM | Disposition: A | Discharge status: Home / Self Care | Payer: Self-pay | Source: Home / Self Care | Attending: Internal Medicine

## 2011-07-24 DIAGNOSIS — K5792 Diverticulitis of intestine, part unspecified, without perforation or abscess without bleeding: Secondary | ICD-10-CM | POA: Diagnosis present

## 2011-07-24 DIAGNOSIS — K922 Gastrointestinal hemorrhage, unspecified: Secondary | ICD-10-CM | POA: Diagnosis not present

## 2011-07-24 DIAGNOSIS — D126 Benign neoplasm of colon, unspecified: Secondary | ICD-10-CM | POA: Diagnosis not present

## 2011-07-24 DIAGNOSIS — R1032 Left lower quadrant pain: Secondary | ICD-10-CM

## 2011-07-24 DIAGNOSIS — I10 Essential (primary) hypertension: Secondary | ICD-10-CM | POA: Diagnosis not present

## 2011-07-24 DIAGNOSIS — K635 Polyp of colon: Secondary | ICD-10-CM | POA: Diagnosis present

## 2011-07-24 DIAGNOSIS — K5732 Diverticulitis of large intestine without perforation or abscess without bleeding: Secondary | ICD-10-CM | POA: Diagnosis not present

## 2011-07-24 DIAGNOSIS — D649 Anemia, unspecified: Secondary | ICD-10-CM

## 2011-07-24 DIAGNOSIS — K5712 Diverticulitis of small intestine without perforation or abscess without bleeding: Secondary | ICD-10-CM

## 2011-07-24 DIAGNOSIS — E119 Type 2 diabetes mellitus without complications: Secondary | ICD-10-CM | POA: Diagnosis not present

## 2011-07-24 DIAGNOSIS — K625 Hemorrhage of anus and rectum: Secondary | ICD-10-CM

## 2011-07-24 HISTORY — PX: COLONOSCOPY: SHX5424

## 2011-07-24 HISTORY — DX: Diverticulitis of intestine, part unspecified, without perforation or abscess without bleeding: K57.92

## 2011-07-24 HISTORY — DX: Benign neoplasm of colon, unspecified: D12.6

## 2011-07-24 LAB — BASIC METABOLIC PANEL
BUN: 16 mg/dL (ref 6–23)
Chloride: 102 mEq/L (ref 96–112)
GFR calc Af Amer: 68 mL/min — ABNORMAL LOW (ref >90)
GFR calc non Af Amer: 58 mL/min — ABNORMAL LOW (ref >90)
Potassium: 3.7 mEq/L (ref 3.5–5.1)
Sodium: 136 mEq/L (ref 135–145)

## 2011-07-24 LAB — CBC
HCT: 25.8 % — ABNORMAL LOW (ref 36.0–46.0)
HCT: 25.6 % — ABNORMAL LOW (ref 36.0–46.0)
Hemoglobin: 8.4 g/dL — ABNORMAL LOW (ref 12.0–15.0)
MCH: 28.5 pg (ref 26.0–34.0)
MCHC: 32.6 g/dL (ref 30.0–36.0)
MCHC: 32.8 g/dL (ref 30.0–36.0)
MCV: 86.8 fL (ref 78.0–100.0)
Platelets: 147 10*3/uL — ABNORMAL LOW (ref 150–400)
Platelets: 143 10*3/uL — ABNORMAL LOW (ref 150–400)
RBC: 2.95 MIL/uL — ABNORMAL LOW (ref 3.87–5.11)
RDW: 14.5 % (ref 11.5–15.5)
RDW: 14.6 % (ref 11.5–15.5)
WBC: 6.7 10*3/uL (ref 4.0–10.5)
WBC: 6.5 10*3/uL (ref 4.0–10.5)

## 2011-07-24 LAB — HEMOGLOBIN AND HEMATOCRIT, BLOOD: Hemoglobin: 8.9 g/dL — ABNORMAL LOW (ref 12.0–15.0)

## 2011-07-24 LAB — GLUCOSE, CAPILLARY
Glucose-Capillary: 133 mg/dL — ABNORMAL HIGH (ref 70–99)
Glucose-Capillary: 161 mg/dL — ABNORMAL HIGH (ref 70–99)

## 2011-07-24 SURGERY — COLONOSCOPY
Anesthesia: Moderate Sedation

## 2011-07-24 MED ORDER — METRONIDAZOLE IN NACL 5-0.79 MG/ML-% IV SOLN
500.0000 mg | Freq: Three times a day (TID) | INTRAVENOUS | Status: DC
  Administered 2011-07-24 – 2011-07-25 (×3): 500 mg via INTRAVENOUS
  Filled 2011-07-24 (×5): qty 100

## 2011-07-24 MED ORDER — STERILE WATER FOR IRRIGATION IR SOLN
Status: DC | PRN
  Administered 2011-07-24: 14:00:00

## 2011-07-24 MED ORDER — IBUPROFEN 400 MG/4ML IV SOLN
INTRAVENOUS | Status: AC
  Filled 2011-07-24: qty 12

## 2011-07-24 MED ORDER — MIDAZOLAM HCL 5 MG/5ML IJ SOLN
INTRAMUSCULAR | Status: DC | PRN
  Administered 2011-07-24: 2 mg via INTRAVENOUS
  Administered 2011-07-24 (×2): 1 mg via INTRAVENOUS

## 2011-07-24 MED ORDER — CIPROFLOXACIN IN D5W 400 MG/200ML IV SOLN
400.0000 mg | Freq: Two times a day (BID) | INTRAVENOUS | Status: DC
  Administered 2011-07-24 – 2011-07-25 (×2): 400 mg via INTRAVENOUS
  Filled 2011-07-24 (×3): qty 200

## 2011-07-24 MED ORDER — MIDAZOLAM HCL 5 MG/5ML IJ SOLN
INTRAMUSCULAR | Status: AC
  Filled 2011-07-24: qty 10

## 2011-07-24 MED ORDER — LISINOPRIL 10 MG PO TABS
20.0000 mg | ORAL_TABLET | Freq: Every day | ORAL | Status: DC
  Administered 2011-07-25 – 2011-07-26 (×2): 20 mg via ORAL
  Filled 2011-07-24 (×2): qty 2

## 2011-07-24 MED ORDER — MEPERIDINE HCL 50 MG/ML IJ SOLN
INTRAMUSCULAR | Status: AC
  Filled 2011-07-24: qty 1

## 2011-07-24 MED ORDER — HYDRALAZINE HCL 20 MG/ML IJ SOLN
10.0000 mg | Freq: Once | INTRAMUSCULAR | Status: AC
  Administered 2011-07-24: 10 mg via INTRAVENOUS
  Filled 2011-07-24: qty 1

## 2011-07-24 MED ORDER — CIPROFLOXACIN IN D5W 400 MG/200ML IV SOLN
INTRAVENOUS | Status: AC
  Filled 2011-07-24: qty 400

## 2011-07-24 MED ORDER — METOPROLOL TARTRATE 50 MG PO TABS
50.0000 mg | ORAL_TABLET | Freq: Two times a day (BID) | ORAL | Status: DC
  Administered 2011-07-24 – 2011-07-26 (×4): 50 mg via ORAL
  Filled 2011-07-24 (×4): qty 1

## 2011-07-24 MED ORDER — METRONIDAZOLE IN NACL 5-0.79 MG/ML-% IV SOLN
INTRAVENOUS | Status: AC
  Filled 2011-07-24: qty 200

## 2011-07-24 MED ORDER — PEG 3350-KCL-NA BICARB-NACL 420 G PO SOLR
4000.0000 mL | Freq: Once | ORAL | Status: AC
  Administered 2011-07-24: 4000 mL via ORAL
  Filled 2011-07-24: qty 4000

## 2011-07-24 MED ORDER — MEPERIDINE HCL 50 MG/ML IJ SOLN
INTRAMUSCULAR | Status: DC | PRN
Start: 2011-07-24 — End: 2011-07-24
  Administered 2011-07-24 (×2): 25 mg via INTRAVENOUS

## 2011-07-24 NOTE — Progress Notes (Signed)
Discussed with Dr. Dionicia Abler. Colonoscopy images reviewed.  Subjective: No further bleeding. No nausea. Hungry. No pain.  Objective: Vital signs in last 24 hours: Filed Vitals:   07/24/11 1500 07/24/11 1505 07/24/11 1510 07/24/11 1515  BP: 155/73 146/75 154/53 134/58  Pulse: 70 73 64 63  Temp:      TempSrc:      Resp: 19 13 21 22   Height:      Weight:      SpO2: 92% 89% 93% 93%   Weight change:   Intake/Output Summary (Last 24 hours) at 07/24/11 1658 Last data filed at 07/24/11 0600  Gross per 24 hour  Intake   1709 ml  Output    500 ml  Net   1209 ml   Physical Exam: Exam unchanged from 07/23/2011  Lab Results: Basic Metabolic Panel:  Lab 07/24/11 4098 07/23/11 1106  NA 136 136  K 3.7 4.6  CL 102 100  CO2 30 27  GLUCOSE 93 170*  BUN 16 25*  CREATININE 0.94 1.12*  CALCIUM 8.3* 9.0  MG -- --  PHOS -- --   Liver Function Tests:  Lab 07/23/11 1552  AST 13  ALT 10  ALKPHOS 51  BILITOT 0.3  PROT 5.9*  ALBUMIN 3.2*    Lab 07/23/11 1552  LIPASE 24  AMYLASE --   No results found for this basename: AMMONIA:2 in the last 168 hours CBC:  Lab 07/24/11 1033 07/24/11 0439 07/23/11 1106  WBC -- 6.7 10.8*  NEUTROABS -- -- 8.5*  HGB 8.9* 8.4* --  HCT 27.0* 25.8* --  MCV -- 86.9 86.8  PLT -- 147* 158   Cardiac Enzymes: No results found for this basename: CKTOTAL:3,CKMB:3,CKMBINDEX:3,TROPONINI:3 in the last 168 hours BNP: No results found for this basename: PROBNP:3 in the last 168 hours D-Dimer: No results found for this basename: DDIMER:2 in the last 168 hours CBG:  Lab 07/24/11 1623 07/24/11 1116 07/24/11 0747 07/23/11 2101 07/23/11 1640  GLUCAP 92 133* 81 104* 90   Hemoglobin A1C: No results found for this basename: HGBA1C in the last 168 hours Fasting Lipid Panel: No results found for this basename: CHOL,HDL,LDLCALC,TRIG,CHOLHDL,LDLDIRECT in the last 119 hours Thyroid Function Tests: No results found for this basename:  TSH,T4TOTAL,FREET4,T3FREE,THYROIDAB in the last 168 hours Coagulation:  Lab 07/23/11 1552  LABPROT 15.2  INR 1.18   Micro Results: Recent Results (from the past 240 hour(s))  CLOSTRIDIUM DIFFICILE BY PCR     Status: Normal   Collection Time   07/24/11  4:18 AM      Component Value Range Status Comment   C difficile by pcr NEGATIVE  NEGATIVE  Final    Studies/Results: Ct Abdomen Pelvis W Contrast  07/23/2011  *RADIOLOGY REPORT*  Clinical Data: Abdominal pain, GI bleed.  CT ABDOMEN AND PELVIS WITH CONTRAST  Technique:  Multidetector CT imaging of the abdomen and pelvis was performed following the standard protocol during bolus administration of intravenous contrast.  Contrast: OMNIPAQUE IOHEXOL 300 MG/ML IJ SOLN  Comparison: 06/24/2010  Findings: Heart is borderline enlarged.  Lung bases clear.  No effusions.  Prior cholecystectomy.  Slight intrahepatic biliary ductal prominence likely related to post cholecystectomy state.  No focal abnormality in the liver.  Spleen, pancreas, right adrenal unremarkable.  Fullness of the left adrenal compatible with hyperplasia, stable.  Bilateral renal cysts are noted which appear benign.  Severe sigmoid diverticulosis.  No changes of active diverticulitis.  Calcified fibroids in the uterus.  Prominent left ovary is stable since  prior CT.  No right adnexal abnormality. Small bowel is decompressed.  No free fluid, free air or adenopathy.  Aorta is normal caliber. Degenerative changes in the thoracolumbar spine.  IMPRESSION: No acute findings in the abdomen or pelvis.  Stable prominence of the left ovary.  Calcified uterine fibroids.  Prior cholecystectomy.  Colonic diverticulosis.  Original Report Authenticated By: Cyndie Chime, M.D.   Scheduled Meds:   . ciprofloxacin  400 mg Intravenous Q12H  . citalopram  20 mg Oral Daily  . insulin glargine  20 Units Subcutaneous QHS  . meperidine      . metronidazole  500 mg Intravenous Q8H  . midazolam      .  oxybutynin  5 mg Oral Daily  . pantoprazole (PROTONIX) IV  40 mg Intravenous Q24H  . polyethylene glycol-electrolytes  4,000 mL Oral Once  . simvastatin  40 mg Oral q1800  . sodium chloride  3 mL Intravenous Q12H  . sodium chloride       Continuous Infusions:   . sodium chloride 125 mL/hr at 07/24/11 0101   PRN Meds:.acetaminophen, acetaminophen, HYDROmorphone, ondansetron (ZOFRAN) IV, ondansetron, traZODone, DISCONTD: meperidine, DISCONTD: midazolam, DISCONTD: simethicone susp in sterile water 1000 mL irrigation Assessment/Plan: Principal Problem:  *Lower gastrointestinal bleed Active Problems:  Acute blood loss anemia  Diverticulitis  Nausea & vomiting  Benign hypertension  DM type 2 (diabetes mellitus, type 2)  Sinus bradycardia  Obesity  Polyp of colon  Bleeding has stopped. Antibiotics have been started. Pain and nausea resolved. Appreciate Dr. Inge Rise assistance.   LOS: 1 day   Agron Swiney L 07/24/2011, 4:58 PM

## 2011-07-24 NOTE — Consult Note (Signed)
Shelly Jackson, Shelly Jackson                ACCOUNT NO.:  [id number]  MEDICAL RECORD NO.:  [id number]  LOCATION:  A322                          FACILITY:  [facility]  PHYSICIAN:  Lionel December, M.D.    DATE OF BIRTH:  1934/09/12  DATE OF CONSULTATION: DATE OF DISCHARGE:                                CONSULTATION   REASON FOR CONSULTATION:  Rectal bleeding and anemia.  HISTORY OF PRESENT ILLNESS:  Shelly Jackson is a 77 year old Caucasian female who is admitted to Dr. Pincus Sanes service via emergency room yesterday where she presented with a 1-day history of rectal bleeding.  The patient states she was in her usual state of health until afternoon of July 22, 2011, when she thought she needed to have a bowel movement and instead she passed large amount of bright red blood per rectum and she states it kept coming.  Twenty four hours later, she came to emergency room.  She was also complaining of left lower quadrant abdominal pain.  Her H and H was low at 9.3 and 28.2.  She had abdominopelvic CT which was negative for acute findings.  The patient was hospitalized for further management.  Her hemoglobin dropped to 8.1 and 24.3 last evening, but this morning it is up to 8.4 and 25.8.  The patient states since she has been admitted she has passed low volume bright red blood per rectum.  Her abdominal pain has resolved.  She has been tolerating clear liquids.  With onset of her rectal bleeding, she did experience nausea, but this has also resolved.  She felt weak and lightheaded, but did not pass out.  She denies chest pain or shortness of breath.  Prior to this illness, she has felt well.  She has a good appetite, her bowels move regularly.  Last fall she lost 25 pounds by getting on her exercise bike.  She did receive a course of antibiotic 2 months ago for sinusitis, but did not experience any diarrhea or other problems.  She generally drinks bottled water.  CURRENT MEDICATIONS:  Oxybutynin 5 mg p.o.  daily, pantoprazole 40 mg IV q.24 hours, simvastatin 40 mg p.o. daily, citalopram 20 mg p.o. daily, insulin glargine 20 units subcu nightly.  P.r.n. medications include acetaminophen, hydromorphone, ondansetron, and trazodone.  Home meds include aspirin 81 mg p.o. daily, insulin glargine 40 units subcu nightly, lisinopril 20 mg p.o. daily, metformin 500 mg in a.m. and 250 mg in p.m., metoprolol 50 mg p.o. b.i.d., oxybutynin 5 mg p.o. q.24 hours, pravastatin 80 mg p.o. daily, and prazosin 1 mg p.o. nightly.  PAST MEDICAL HISTORY: 1. She has been hypertensive since 1971. 2. She has been diabetic since 1992. 3. History of congestive heart failure.  She had a cardiac cath in     2000 at [place], [place] and significant disease was not found.     She is under care of Dr. Earna Coder who is a cardiologist in [place].     She is presently not on diuretic therapy. 4. GERD, symptoms about 10 years. 5. Hyperlipidemia. 6. Stress incontinence. 7. She has had 2 colonoscopies, the first one was about 14 years ago     and she  had a polyp removed.  More recently, she had colonoscopy by     Dr. Jena Gauss in April 2009 with removal of single polyp which was     hypoplastic. 8. She had appendectomy when she was 67 or 77 years old. 9. She had cholecystectomy in 1965. 10.She was in emergency room 1 month ago for a kidney stone which she     passed spontaneously.  ALLERGIES:  NK.  FAMILY HISTORY:  Father died of MI at age 42.  Mother died of cirrhosis at age 71.  She believes her cirrhosis was due to one of her medications.  The patient does not have any siblings.  SOCIAL HISTORY:  She is widowed.  She has 2 sons.  One son has had coronary artery bypass graft and is doing fine.  The other one had hip replacement for congenital problems and also doing fine.  She has never smoked cigarettes or drank alcohol.  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  PHYSICAL EXAMINATION:  VITAL SIGNS:  Pleasant, well-developed, mildly obese Caucasian female who is in no acute distress. VITAL SIGNS:  She weighs 207 pounds.  She is 63 inches tall.  Pulse 65 per minute and regular, blood pressure 179/69, respirations 20, and temp is 98.8. HEENT:  Conjunctivae are pale.  Sclerae are nonicteric.  Oropharyngeal mucosa is normal. NEC:  No neck masses or thyromegaly noted. CARDIAC:  With regular rhythm.  Normal S1 and S2.  She has grade 2/6 systolic ejection murmur heard all over the precordium. LUNGS:  Clear to auscultation. ABDOMEN:  Protuberant with normal bowel sounds.  On palpation, she has mild tenderness in the hypogastrium, but she says her bladder is full. No organomegaly or masses noted. RECTAL:  Deferred.  No peripheral edema or clubbing noted.  LABORATORY DATA FROM ADMISSION:  WBC 10.8, H and H 9.3 and 28.2, platelet count 158,000.  Electrolytes normal.  Glucose 170, BUN 25, creatinine 1.12, calcium 9.0, total bilirubin 0.3, AP 51, AST 13, ALT 10, total protein 5.9 with albumin of 3.2.  INR was 1.18.  Her PTT was 32.  H and H last evening was 8.1 and 24.3 and this morning is 8.4 and 25.8.  Her BUN is 16, creatinine 0.94.  Abdominopelvic CT reviewed.  She has pancolonic diverticulosis, but no colonic wall thickening.  She has calcified uterine fibroids, prominent left ovary, and mildly dilated bile duct and should be normal in a patient who has had gallbladder without cholecystectomy.  ASSESSMENT:  Shelly Jackson is a 77 year old Caucasian female who presents with acute gastrointestinal bleed felt to be from lower gastrointestinal tracts and possibly diverticular bleed. Her last colonoscopy was 4 years ago, therefore her lower GI tract needs to be evaluated to rule out other lesions.  Her hemoglobin is low, but it seems to have leveled off and now coming up.  She appears to be  hemodynamically stable.  RECOMMENDATIONS:  She will be prepped with [facility] this morning and undergo colonoscopy later today.  I have reviewed the procedures with the patient.  I have answered all the questions.  She is agreeable.  We appreciate the opportunity to participate in the care of this nice lady.          ______________________________ Lionel December, M.D.     NR/MEDQ  D:  07/24/2011  T:  07/24/2011  Job:  [id number]  cc:   Scott A. Gerda Diss, MD Fax: [phone]

## 2011-07-24 NOTE — Progress Notes (Signed)
Patient had BM, BM noted to be burgundy in color and soft, specimen sent to lab

## 2011-07-24 NOTE — Op Note (Signed)
COLONOSCOPY PROCEDURE REPORT  PATIENT:  Shelly Jackson  MR#:  [id number] Birthdate:  Nov 26, 1934, 77 y.o., female Endoscopist:  Dr. Malissa Hippo, MD Referred By:  Dr. Darnelle Spangle, MD Procedure Date: 07/24/2011  Procedure:   Colonoscopy with snare polypectomy.  Indications: Patient is a 77 year old Caucasian female with acute GI bleed felt to be colonic. They should his last colonoscopy was in April 2009. She is undergoing diagnostic/ therapeutic colonoscopy.  Informed Consent:  The procedure and risks were reviewed with the patient and informed consent was obtained.  Medications:  Demerol 50 mg IV Versed 4 mg IV  Description of procedure:  After a digital rectal exam was performed, that colonoscope was advanced from the anus through the rectum and colon to the area of the cecum, ileocecal valve. The appendiceal orifice and mucosa behind the cecal valve was not seen. As the scope was slowly withdrawn mucosal  surfaces were carefully surveyed utilizing scope tip to flexion to facilitate fold flattening as needed. The scope was pulled down into the rectum where a thorough exam including retroflexion was performed.  Findings:   Prep excellent. Scant amount of old blood noted but no evidence of active bleeding. Appendiceal orifice and mucosa behind the ileocecal valve not seen.  4 mm polyp across and above ileocecal valve not removed as of suboptimal approach to this lesion. Scattered diverticulum noted throughout the colon but most of these were located at sigmoid and descending colon. Single diverticulum in descending colon with peri-stomal erythema edema and mucopurulent liquid in the lumen. 6-7 mm polyp at hemorrhagic surface state from proximal sigmoid colon. This polyp was retrieved for histologic examination. Normal rectal mucosa. Small hemorrhoids below the dentate line.  Therapeutic/Diagnostic Maneuvers Performed:  See above  Complications:  None  Cecal Withdrawal Time:   15 minutes  Impression:  Examination performed to proximal ascending colon that good view of ileocecal valve and appendiceal orifice and mucosa behind the valve not seen. 4 mm polyp at proximal ascending colon could not be removed because of difficult approach. Pancolonic diverticulosis with changes of diverticulitis involving diverticulum at the ascending colon. No stigmata of bleeding noted but diverticular bleed suspected. 6-7 mm polyp snared from proximal sigmoid colon hemorrhagic surface but no active bleeding noted. Small external hemorrhoids.  Recommendations:  Full liquid diet. Cipro 500 mg IV every 12 hours. Metronidazole 500 milligrams IV q. 8 hours. Decrease  IV rate to 75 mL per hour. H&H at 6 a.m. and CBC in a.m. Consider elective  colonoscopy in one year  [name] U  07/24/2011 3:32 PM  CC: Dr. Lilyan Punt, MD, MD & Dr. Bonnetta Barry ref. provider found Dr. Jonathon Bellows, MD.

## 2011-07-24 NOTE — Consult Note (Signed)
Please see dictated note; Acute GI Bleed possibly sec to colonic diverticulosis. Last TCS in April. 2009. Will prep patient for colonoscopy later today.

## 2011-07-25 DIAGNOSIS — K5732 Diverticulitis of large intestine without perforation or abscess without bleeding: Secondary | ICD-10-CM | POA: Diagnosis not present

## 2011-07-25 DIAGNOSIS — E119 Type 2 diabetes mellitus without complications: Secondary | ICD-10-CM | POA: Diagnosis not present

## 2011-07-25 DIAGNOSIS — I10 Essential (primary) hypertension: Secondary | ICD-10-CM | POA: Diagnosis not present

## 2011-07-25 DIAGNOSIS — K922 Gastrointestinal hemorrhage, unspecified: Secondary | ICD-10-CM | POA: Diagnosis not present

## 2011-07-25 LAB — GLUCOSE, CAPILLARY
Glucose-Capillary: 140 mg/dL — ABNORMAL HIGH (ref 70–99)
Glucose-Capillary: 212 mg/dL — ABNORMAL HIGH (ref 70–99)

## 2011-07-25 LAB — FECAL LACTOFERRIN, QUANT

## 2011-07-25 MED ORDER — CIPROFLOXACIN HCL 250 MG PO TABS
500.0000 mg | ORAL_TABLET | Freq: Two times a day (BID) | ORAL | Status: DC
  Administered 2011-07-25 – 2011-07-26 (×3): 500 mg via ORAL
  Filled 2011-07-25 (×3): qty 2

## 2011-07-25 MED ORDER — PRAZOSIN HCL 1 MG PO CAPS
1.0000 mg | ORAL_CAPSULE | Freq: Every day | ORAL | Status: DC
  Administered 2011-07-25: 1 mg via ORAL
  Filled 2011-07-25 (×3): qty 1

## 2011-07-25 MED ORDER — METRONIDAZOLE 500 MG PO TABS
500.0000 mg | ORAL_TABLET | Freq: Two times a day (BID) | ORAL | Status: DC
  Administered 2011-07-25 – 2011-07-26 (×3): 500 mg via ORAL
  Filled 2011-07-25 (×3): qty 1

## 2011-07-25 NOTE — Progress Notes (Addendum)
Subjective: No further bleeding. No nausea. Hungry. No pain. Feels weak  Objective: Vital signs in last 24 hours: Filed Vitals:   07/24/11 2126 07/24/11 2129 07/25/11 0002 07/25/11 0622  BP: 191/73  154/75 199/81  Pulse: 64   60  Temp: 98.5 F (36.9 C)   97.9 F (36.6 C)  TempSrc: Oral   Oral  Resp: 22   20  Height:      Weight:      SpO2: 90% 91%  91%   Weight change:   Intake/Output Summary (Last 24 hours) at 07/25/11 1422 Last data filed at 07/25/11 0500  Gross per 24 hour  Intake    360 ml  Output      0 ml  Net    360 ml   Physical Exam: Exam unchanged from 07/23/2011  Lab Results: Basic Metabolic Panel:  Lab 07/24/11 1610 07/23/11 1106  NA 136 136  K 3.7 4.6  CL 102 100  CO2 30 27  GLUCOSE 93 170*  BUN 16 25*  CREATININE 0.94 1.12*  CALCIUM 8.3* 9.0  MG -- --  PHOS -- --   Liver Function Tests:  Lab 07/23/11 1552  AST 13  ALT 10  ALKPHOS 51  BILITOT 0.3  PROT 5.9*  ALBUMIN 3.2*    Lab 07/23/11 1552  LIPASE 24  AMYLASE --   No results found for this basename: AMMONIA:2 in the last 168 hours CBC:  Lab 07/24/11 1706 07/24/11 1033 07/24/11 0439 07/23/11 1106  WBC 6.5 -- 6.7 --  NEUTROABS -- -- -- 8.5*  HGB 8.4* 8.9* -- --  HCT 25.6* 27.0* -- --  MCV 86.8 -- 86.9 --  PLT 143* -- 147* --   Cardiac Enzymes: No results found for this basename: CKTOTAL:3,CKMB:3,CKMBINDEX:3,TROPONINI:3 in the last 168 hours BNP: No results found for this basename: PROBNP:3 in the last 168 hours D-Dimer: No results found for this basename: DDIMER:2 in the last 168 hours CBG:  Lab 07/25/11 1131 07/25/11 0735 07/24/11 2032 07/24/11 1623 07/24/11 1116 07/24/11 0747  GLUCAP 128* 140* 161* 92 133* 81   Hemoglobin A1C: No results found for this basename: HGBA1C in the last 168 hours Fasting Lipid Panel: No results found for this basename: CHOL,HDL,LDLCALC,TRIG,CHOLHDL,LDLDIRECT in the last 960 hours Thyroid Function Tests: No results found for this  basename: TSH,T4TOTAL,FREET4,T3FREE,THYROIDAB in the last 168 hours Coagulation:  Lab 07/23/11 1552  LABPROT 15.2  INR 1.18   Micro Results: Recent Results (from the past 240 hour(s))  CLOSTRIDIUM DIFFICILE BY PCR     Status: Normal   Collection Time   07/24/11  4:18 AM      Component Value Range Status Comment   C difficile by pcr NEGATIVE  NEGATIVE  Final    Studies/Results: No results found. Scheduled Meds:    . ciprofloxacin  500 mg Oral BID  . citalopram  20 mg Oral Daily  . hydrALAZINE  10 mg Intravenous Once  . insulin glargine  20 Units Subcutaneous QHS  . lisinopril  20 mg Oral Daily  . meperidine      . metoprolol  50 mg Oral BID  . metroNIDAZOLE  500 mg Oral Q12H  . midazolam      . oxybutynin  5 mg Oral Daily  . prazosin  1 mg Oral QHS  . simvastatin  40 mg Oral q1800  . DISCONTD: ciprofloxacin  400 mg Intravenous Q12H  . DISCONTD: metronidazole  500 mg Intravenous Q8H  . DISCONTD: pantoprazole (PROTONIX) IV  40 mg Intravenous Q24H  . DISCONTD: sodium chloride  3 mL Intravenous Q12H   Continuous Infusions:    . DISCONTD: sodium chloride 50 mL/hr at 07/25/11 1156   PRN Meds:.acetaminophen, HYDROmorphone, ondansetron (ZOFRAN) IV, ondansetron, traZODone, DISCONTD: acetaminophen, DISCONTD: meperidine, DISCONTD: midazolam, DISCONTD: simethicone susp in sterile water 1000 mL irrigation Assessment/Plan: Principal Problem:  *Lower gastrointestinal bleed Active Problems:  Acute blood loss anemia  Diverticulitis  Nausea & vomiting Malignant hypertension  DM type 2 (diabetes mellitus, type 2)  Sinus bradycardia  Obesity  Polyp of colon  Agree with advancing diet, changing antibiotics to by mouth. Will discontinue IV. Blood pressure is quite high and this will decrease her salt load. Also, resume prazosin   LOS: 2 days   Roman Dubuc L 07/25/2011, 2:22 PM

## 2011-07-25 NOTE — Progress Notes (Signed)
Subjective; Patient has no complaints. She denies abdominal pain or nausea. She is hungry. She has been passing flatus but hasn't passed any blood per rectum. She says she is passing too much urine but denies dysuria. Objective; BP 199/81  Pulse 60  Temp(Src) 97.9 F (36.6 C) (Oral)  Resp 20  Ht 5\' 3"  (1.6 m)  Wt 207 lb (93.895 kg)  BMI 36.67 kg/m2  SpO2 91% Abdomen is full. Bowel sounds are normal. Palpation reveals soft abdomen without tenderness organomegaly or masses. No anti-edema noted. Lab data; CBC from this morning; WBC 6.5, hemoglobin 8.4, hematocrit 25.6, platelet count 143K. H&H last night was 8.4 and 25.6. Assessment; #1. LGI bleed secondary to colonic diverticulosis. No evidence of active bleeding. #2. Left-sided diverticulitis noted at the time of colonoscopy. Abdominal exam is benign. Patient is on IV antibiotics. #3. Anemia secondary to GI bleed. H&H stable over the last 12 hours. No indication for transfusion at this time. Recommendations; Change Cipro and metronidazole to oral route. She will need total of 14 days of therapy. Advance diet. CBC in a.m. Decrease IV rate to 50 mL per hour. Resume aspirin in 2 weeks.

## 2011-07-26 ENCOUNTER — Encounter: Payer: Self-pay | Admitting: Internal Medicine

## 2011-07-26 DIAGNOSIS — I10 Essential (primary) hypertension: Secondary | ICD-10-CM | POA: Diagnosis not present

## 2011-07-26 DIAGNOSIS — K5732 Diverticulitis of large intestine without perforation or abscess without bleeding: Secondary | ICD-10-CM | POA: Diagnosis not present

## 2011-07-26 DIAGNOSIS — K922 Gastrointestinal hemorrhage, unspecified: Secondary | ICD-10-CM | POA: Diagnosis not present

## 2011-07-26 DIAGNOSIS — E119 Type 2 diabetes mellitus without complications: Secondary | ICD-10-CM | POA: Diagnosis not present

## 2011-07-26 DIAGNOSIS — D696 Thrombocytopenia, unspecified: Secondary | ICD-10-CM | POA: Diagnosis not present

## 2011-07-26 LAB — CBC
MCHC: 32.6 g/dL (ref 30.0–36.0)
WBC: 6.3 10*3/uL (ref 4.0–10.5)

## 2011-07-26 LAB — GLUCOSE, CAPILLARY
Glucose-Capillary: 154 mg/dL — ABNORMAL HIGH (ref 70–99)
Glucose-Capillary: 169 mg/dL — ABNORMAL HIGH (ref 70–99)

## 2011-07-26 MED ORDER — HYDRALAZINE HCL 20 MG/ML IJ SOLN
10.0000 mg | Freq: Four times a day (QID) | INTRAMUSCULAR | Status: DC | PRN
  Administered 2011-07-26: 10 mg via INTRAVENOUS
  Filled 2011-07-26: qty 1

## 2011-07-26 MED ORDER — FERROUS SULFATE 325 (65 FE) MG PO TABS: 325.0000 mg | ORAL_TABLET | Freq: Every day | ORAL | Status: DC

## 2011-07-26 MED ORDER — CIPROFLOXACIN HCL 500 MG PO TABS: 500.0000 mg | ORAL_TABLET | Freq: Two times a day (BID) | ORAL | Status: AC

## 2011-07-26 MED ORDER — METRONIDAZOLE 500 MG PO TABS: 500.0000 mg | ORAL_TABLET | Freq: Three times a day (TID) | ORAL | Status: AC

## 2011-07-26 MED ORDER — THERA VITAL M PO TABS: 1.0000 | ORAL_TABLET | Freq: Every day | ORAL | Status: AC

## 2011-07-26 MED ORDER — FERROUS SULFATE 325 (65 FE) MG PO TABS: 325.0000 mg | ORAL_TABLET | Freq: Every day | ORAL | Status: AC

## 2011-07-26 NOTE — Progress Notes (Signed)
Utilization review completed.  

## 2011-07-26 NOTE — Progress Notes (Signed)
Inpatient Diabetes Program Recommendations  AACE/ADA: New Consensus Statement on Inpatient Glycemic Control  Target Ranges:  Prepandial:   less than 140 mg/dL      Peak postprandial:   less than 180 mg/dL (1-2 hours)      Critically ill patients:  140 - 180 mg/dL  Pager:  [phone] Hours:  8 am-10pm   Reason for Visit: Elevated prandial glucose: 140, 128, 154, 212 mg/dL after starting meals at dinner last pm.  Inpatient Diabetes Program Recommendations Insulin - Meal Coverage: Add Novolog 3 units TID for meal coverage

## 2011-07-26 NOTE — Discharge Summary (Signed)
Physician Discharge Summary  Shelly Jackson MRN: [id number] DOB/AGE: July 01, 1934 77 y.o.  PCP: Lilyan Punt, MD, MD   Admit date: 07/23/2011 Discharge date: 07/26/2011  Discharge Diagnoses:  1. Lower GI bleeding secondary to colonic diverticulosis. 2. Left-sided diverticulitis per colonoscopy. 3. Colonoscopy with snare polypectomy per Dr. Karilyn Cota on July 24 2011 revealed scant amount of old blood noted but no evidence of active bleeding; 4 mm polyp above the ileocecal valve, scattered diverticulum, single diverticulum in the descending colon with peri-scrotal erythema and edema and mucopurulent liquid in the lumen, 6-7 mm polyp at hemorrhagic surface from proximal sigmoid colon; Normal rectal mucosa; Small hemorrhoids. 4. Acute blood loss anemia. The patient's hemoglobin was 9.3 on admission and 8.7 at the time of discharge. 5. Nausea and vomiting. Resolved. 6. Malignant hypertension off of antihypertensive medications. 7. Asymptomatic bradycardia secondary to metoprolol. 8. Thrombocytopenia. The patient's platelet count was 148 at the time of discharge. 9. Obesity. 10. Acute renal failure secondary to prerenal azotemia. Resolved.    Medication List  As of 07/26/2011  2:04 PM   STOP taking these medications         aspirin EC 81 MG tablet         TAKE these medications         ciprofloxacin 500 MG tablet   Commonly known as: CIPRO   Take 1 tablet (500 mg total) by mouth 2 (two) times daily. TAKE FOR 12 MORE DAYS FOR THE INFECTION.      citalopram 20 MG tablet   Commonly known as: CELEXA   Take 20 mg by mouth daily.      ferrous sulfate 325 (65 FE) MG tablet   Take 1 tablet (325 mg total) by mouth daily with breakfast. IRON SUPPLEMENT.      insulin glargine 100 UNIT/ML injection   Commonly known as: LANTUS   Inject 40 Units into the skin at bedtime.      lisinopril 20 MG tablet   Commonly known as: PRINIVIL,ZESTRIL   Take 20 mg by mouth daily.      metFORMIN 1000  MG tablet   Commonly known as: GLUCOPHAGE   Take 500-1,000 mg by mouth 2 (two) times daily with a meal. Patient takes 1 tablet in the morning and 1/2 tablet at night      metoprolol 50 MG tablet   Commonly known as: LOPRESSOR   Take 50 mg by mouth 2 (two) times daily.      metroNIDAZOLE 500 MG tablet   Commonly known as: FLAGYL   Take 1 tablet (500 mg total) by mouth every 8 (eight) hours. TAKE FOR 12 MORE DAYS FOR THE INFECTION.      multivitamin tablet   Take 1 tablet by mouth daily.      oxybutynin 5 MG 24 hr tablet   Commonly known as: DITROPAN-XL   Take 5 mg by mouth daily.      pravastatin 80 MG tablet   Commonly known as: PRAVACHOL   Take 80 mg by mouth at bedtime.      prazosin 1 MG capsule   Commonly known as: MINIPRESS   Take 1 mg by mouth at bedtime.            Discharge Condition: Improved and stable  Disposition: 01-Home or Self Care   Consults: Lionel December M.D.   Significant Diagnostic Studies: Ct Abdomen Pelvis W Contrast  07/23/2011  *RADIOLOGY REPORT*  Clinical Data: Abdominal pain, GI bleed.  CT ABDOMEN AND  PELVIS WITH CONTRAST  Technique:  Multidetector CT imaging of the abdomen and pelvis was performed following the standard protocol during bolus administration of intravenous contrast.  Contrast: OMNIPAQUE IOHEXOL 300 MG/ML IJ SOLN  Comparison: 06/24/2010  Findings: Heart is borderline enlarged.  Lung bases clear.  No effusions.  Prior cholecystectomy.  Slight intrahepatic biliary ductal prominence likely related to post cholecystectomy state.  No focal abnormality in the liver.  Spleen, pancreas, right adrenal unremarkable.  Fullness of the left adrenal compatible with hyperplasia, stable.  Bilateral renal cysts are noted which appear benign.  Severe sigmoid diverticulosis.  No changes of active diverticulitis.  Calcified fibroids in the uterus.  Prominent left ovary is stable since prior CT.  No right adnexal abnormality. Small bowel is  decompressed.  No free fluid, free air or adenopathy.  Aorta is normal caliber. Degenerative changes in the thoracolumbar spine.  IMPRESSION: No acute findings in the abdomen or pelvis.  Stable prominence of the left ovary.  Calcified uterine fibroids.  Prior cholecystectomy.  Colonic diverticulosis.  Original Report Authenticated By: Cyndie Chime, M.D.    Microbiology: Recent Results (from the past 240 hour(s))  CLOSTRIDIUM DIFFICILE BY PCR     Status: Normal   Collection Time   07/24/11  4:18 AM      Component Value Range Status Comment   C difficile by pcr NEGATIVE  NEGATIVE  Final   STOOL CULTURE     Status: Normal (Preliminary result)   Collection Time   07/24/11  5:42 AM      Component Value Range Status Comment   Specimen Description PERIRECTAL   Final    Special Requests NONE   Final    Culture NO SUSPICIOUS COLONIES, CONTINUING TO HOLD   Final    Report Status PENDING   Incomplete      Labs: Results for orders placed during the hospital encounter of 07/23/11 (from the past 48 hour(s))  GLUCOSE, CAPILLARY     Status: Normal   Collection Time   07/24/11  4:23 PM      Component Value Range Comment   Glucose-Capillary 92  70 - 99 (mg/dL)   CBC     Status: Abnormal   Collection Time   07/24/11  5:06 PM      Component Value Range Comment   WBC 6.5  4.0 - 10.5 (K/uL)    RBC 2.95 (*) 3.87 - 5.11 (MIL/uL)    Hemoglobin 8.4 (*) 12.0 - 15.0 (g/dL)    HCT 44.0 (*) 34.7 - 46.0 (%)    MCV 86.8  78.0 - 100.0 (fL)    MCH 28.5  26.0 - 34.0 (pg)    MCHC 32.8  30.0 - 36.0 (g/dL)    RDW 42.5  95.6 - 38.7 (%)    Platelets 143 (*) 150 - 400 (K/uL)   GLUCOSE, CAPILLARY     Status: Abnormal   Collection Time   07/24/11  8:32 PM      Component Value Range Comment   Glucose-Capillary 161 (*) 70 - 99 (mg/dL)   GLUCOSE, CAPILLARY     Status: Abnormal   Collection Time   07/25/11  7:35 AM      Component Value Range Comment   Glucose-Capillary 140 (*) 70 - 99 (mg/dL)   GLUCOSE, CAPILLARY      Status: Abnormal   Collection Time   07/25/11 11:31 AM      Component Value Range Comment   Glucose-Capillary 128 (*) 70 - 99 (  mg/dL)   GLUCOSE, CAPILLARY     Status: Abnormal   Collection Time   07/25/11  4:10 PM      Component Value Range Comment   Glucose-Capillary 154 (*) 70 - 99 (mg/dL)   GLUCOSE, CAPILLARY     Status: Abnormal   Collection Time   07/25/11  9:35 PM      Component Value Range Comment   Glucose-Capillary 212 (*) 70 - 99 (mg/dL)   CBC     Status: Abnormal   Collection Time   07/26/11  5:33 AM      Component Value Range Comment   WBC 6.3  4.0 - 10.5 (K/uL)    RBC 3.03 (*) 3.87 - 5.11 (MIL/uL)    Hemoglobin 8.7 (*) 12.0 - 15.0 (g/dL)    HCT 16.1 (*) 09.6 - 46.0 (%)    MCV 88.1  78.0 - 100.0 (fL)    MCH 28.7  26.0 - 34.0 (pg)    MCHC 32.6  30.0 - 36.0 (g/dL)    RDW 04.5  40.9 - 81.1 (%)    Platelets 148 (*) 150 - 400 (K/uL)   GLUCOSE, CAPILLARY     Status: Abnormal   Collection Time   07/26/11  7:52 AM      Component Value Range Comment   Glucose-Capillary 154 (*) 70 - 99 (mg/dL)   GLUCOSE, CAPILLARY     Status: Abnormal   Collection Time   07/26/11 11:41 AM      Component Value Range Comment   Glucose-Capillary 169 (*) 70 - 99 (mg/dL)      HPI : The patient is a 77 year old woman with a past medical history significant for chronic congestive heart failure, diabetes mellitus, and rheumatoid arthritis, who presented to the emergency department with a chief complaint of nausea, vomiting, and bright red blood per rectum. The day before, she had eaten an egg salad sandwich. In the emergency department, she was noted to be afebrile and hemodynamically stable. She was mildly bradycardic. Her lab data were significant for a hemoglobin of 9.3, WBC of 10.8, MCV of 86.8, platelet count 158, creatinine of 1.12, BUN of 25, and glucose of 170. CT scan of her abdomen and pelvis with contrast revealed degenerative changes in the thoracic or lumbar spine, severe sigmoid  diverticulosis, stable prominence of the left ovary, calcified uterine fibroids, prior cholecystectomy, but no acute findings. She was admitted for further evaluation and management.  HOSPITAL COURSE: The patient was started on IV fluids for hydration. Empiric. IV Protonix was started. Her nausea and vomiting was treated symptomatically with as needed Zofran. Her metoprolol was withheld due to to mild bradycardia. Aspirin was discontinued. Lantus was decreased to half dosing while the patient was on clear liquids. Metoprolol was temporarily withheld because of acute renal insufficiency. Stool studies were ordered. The result was negative for C. difficile by PCR and no suspicious colonies seen on the stool culture, although the final result was pending at the time of discharge. Fecal lactoferrin was positive. Gastroenterologist, Dr. Karilyn Cota was consulted. He noted that the patient had not had a colonoscopy since 2009. His initial assessment was that the patient probably had GI bleeding from colonic diverticulosis. He proceeded with a colonoscopy. The results were dictated above. Following the results, the patient was started on Cipro and Flagyl for a pocket of diverticulitis.  The patient's diet was advanced. Cipro and Flagyl were changed to by mouth. She tolerated it well. She had no further bright red blood per  rectum. Metoprolol and prazosin were eventually restarted as the patient's blood pressures rebounded and increased to the 180s systolically. Her hemoglobin fell to a nadir of 8.4 but stabilized at 8.7 prior to discharge. She required no blood transfusions. Her platelet count also fell slightly from 158 to 148.  The patient was afebrile and hemodynamically stable at the time of discharge. She was asymptomatic. Her white blood cell count normalized. Per the recommendation of Dr. Karilyn Cota, she was discharged to home on Cipro and Flagyl for a total of 14 days. She was advised to restart aspirin in 2 weeks.  She was also discharged on ferrous sulfate once daily and a multivitamin with iron once daily.    Discharge Exam: Blood pressure 181/82, pulse 61, temperature 97.6 F (36.4 C), temperature source Oral, resp. rate 22, height 5\' 3"  (1.6 m), weight 93.895 kg (207 lb), SpO2 96.00%.  Lungs: Clear to auscultation bilaterally. Heart: S1, S2, with a soft systolic murmur. Abdomen: Positive bowel sounds, obese, nontender, nondistended. Extremities: No pedal edema.   Discharge Orders    Future Appointments: Provider: Department: Dept Phone: Center:   08/12/2011 12:30 PM Joselyn Arrow, NP [facility] [phone] [facility]     Future Orders Please Complete By Expires   Diet - low sodium heart healthy      Increase activity slowly      Discharge instructions      Comments:   RESTART YOUR ASPIRIN IN 2 WEEKS.      Follow-up Information    Follow up with Lilyan Punt, MD on 08/04/2011. (AT 10:00 AM)    Contact information:   [address] [place] [address] [phone]          Discharge time: 35 minutes.     Signed: Hawk Mones 07/26/2011, 2:04 PM

## 2011-07-26 NOTE — Plan of Care (Signed)
Problem: Discharge Progression Outcomes Goal: Other Discharge Outcomes/Goals Outcome: Completed/Met Date Met:  07/26/11 IV removed per order cath tip intact  Pt is with out nausea or pain  Discharge instructions read to pt Pt verbalized understanding of all  Discharged to home with friend

## 2011-07-27 ENCOUNTER — Encounter (HOSPITAL_COMMUNITY): Payer: Self-pay | Admitting: Internal Medicine

## 2011-07-27 LAB — STOOL CULTURE

## 2011-08-04 DIAGNOSIS — K625 Hemorrhage of anus and rectum: Secondary | ICD-10-CM | POA: Diagnosis not present

## 2011-08-04 DIAGNOSIS — K5732 Diverticulitis of large intestine without perforation or abscess without bleeding: Secondary | ICD-10-CM | POA: Diagnosis not present

## 2011-08-05 ENCOUNTER — Encounter (INDEPENDENT_AMBULATORY_CARE_PROVIDER_SITE_OTHER): Payer: Self-pay | Admitting: *Deleted

## 2011-08-12 ENCOUNTER — Ambulatory Visit (INDEPENDENT_AMBULATORY_CARE_PROVIDER_SITE_OTHER): Payer: Medicare Other | Admitting: Urgent Care

## 2011-08-12 ENCOUNTER — Encounter: Payer: Self-pay | Admitting: Urgent Care

## 2011-08-12 VITALS — BP 140/80 | HR 64 | Temp 96.5°F | Ht 63.0 in | Wt 211.2 lb

## 2011-08-12 DIAGNOSIS — D62 Acute posthemorrhagic anemia: Secondary | ICD-10-CM

## 2011-08-12 DIAGNOSIS — K5792 Diverticulitis of intestine, part unspecified, without perforation or abscess without bleeding: Secondary | ICD-10-CM

## 2011-08-12 DIAGNOSIS — K5732 Diverticulitis of large intestine without perforation or abscess without bleeding: Secondary | ICD-10-CM

## 2011-08-12 DIAGNOSIS — K922 Gastrointestinal hemorrhage, unspecified: Secondary | ICD-10-CM

## 2011-08-12 NOTE — Assessment & Plan Note (Signed)
Shelly Jackson is a pleasant 77 y.o. female recently admitted to [facility] with diverticular bleed and diverticulitis. She is responded well to course of Cipro and Flagyl. Colonoscopy March 2014. Call sooner if she has any problems.

## 2011-08-12 NOTE — Patient Instructions (Addendum)
Please have Dr. Gerda Diss send copies of your labs in one month You'll need a repeat colonoscopy March 2013 Call us sooner if you develop abdominal pain, weakness, or further bleeding Continue daily iron

## 2011-08-12 NOTE — Progress Notes (Signed)
Faxed to PCP

## 2011-08-12 NOTE — Progress Notes (Signed)
Primary Care Physician:  Lilyan Punt, MD, MD Primary Gastroenterologist:  Dr. Jena Gauss  Chief Complaint  Patient presents with  . Hospital follow up    Diverticulitis, lower GI bleed, anemia   HPI:  Shelly Jackson is a 77 y.o. female here for follow up for recent hospitalization.  She was maintained in hospital with nausea, vomiting and blood per rectum. She was found to be anemic. She underwent a colonoscopy by Dr. Karilyn Cota. She had of left-sided diverticulitis, suspected diverticular bleed, internal hemorrhoids, and a hemorrhagic tubular adenoma removed from the proximal sigmoid colon. She was started on Cipro and Flagyl and has completed this. She tells me her most recent hemoglobin was 8.9 @ Dr Fletcher Anon office.   She is on daily iron.  Denies any rectal bleeding or diarrhea.  Denies abdominal pain or fever.  C/o fatigue & weakness, but seems to feel better after a nap.  Denies CP, SOB or palpitaions. Overall, she feels much better since discharge. It was recommended by Dr. Karilyn Cota that she have a colonoscopy in 1 year.  Past Medical History  Diagnosis Date  . CHF (congestive heart failure)   . Diabetes mellitus   . Hypertension   . Acid reflux   . Rheumatoid arthritis   . Diverticulitis 07/24/11    Inpatient [facility]  . Anemia     Secondary to acute blood loss  . Tubular adenoma of colon 07/24/11  . Thrombocytopenia     Past Surgical History  Procedure Date  . Cholecystectomy   . Appendectomy   . Colonoscopy 07/24/2011    Dr. Aris Everts MM polyp at proximal ascending colon could not be removed because of difficult approach, pancolonic diverticulosis with diverticulitis involving the ascending colon, and suspected diverticular bleed, 7 mm polyp snared from proximal sigmoid colon with hemorrhagic surface found to be a tubular adenoma, small external hemorrhoids  . Colonoscopy 08/2007    Dr. Garnetta Buddy hyperplastic polyp    Current Outpatient Prescriptions  Medication Sig Dispense Refill  .  aspirin 81 MG tablet Take 81 mg by mouth daily.      . citalopram (CELEXA) 20 MG tablet Take 20 mg by mouth daily.      . ferrous sulfate 325 (65 FE) MG tablet Take 1 tablet (325 mg total) by mouth daily with breakfast. IRON SUPPLEMENT.      Marland Kitchen lisinopril (PRINIVIL,ZESTRIL) 20 MG tablet Take 20 mg by mouth daily.      . metFORMIN (GLUCOPHAGE) 1000 MG tablet Take 500-1,000 mg by mouth 2 (two) times daily with a meal. Patient takes 1 tablet in the morning and 1/2 tablet at night      . metoprolol (LOPRESSOR) 50 MG tablet Take 50 mg by mouth 2 (two) times daily.      . Multiple Vitamins-Minerals (MULTIVITAMIN) tablet Take 1 tablet by mouth daily.      . NON FORMULARY Fish Oil     One tablet daily      . NON FORMULARY Vitamin C   One tablet daily      . NON FORMULARY Magnesium   One tablet daily      . oxybutynin (DITROPAN-XL) 5 MG 24 hr tablet Take 5 mg by mouth daily.      . pravastatin (PRAVACHOL) 80 MG tablet Take 80 mg by mouth at bedtime.      . prazosin (MINIPRESS) 1 MG capsule Take 1 mg by mouth 2 (two) times daily.       . insulin glargine (LANTUS) 100 UNIT/ML  injection Inject 40 Units into the skin at bedtime.        Allergies as of 08/12/2011  . (No Known Allergies)  Review of Systems: See history of present illness, otherwise negative review of systems  Physical Exam: BP 140/80  Pulse 64  Temp(Src) 96.5 F (35.8 C) (Tympanic)  Ht 5\' 3"  (1.6 m)  Wt 211 lb 3.2 oz (95.8 kg)  BMI 37.41 kg/m2 General:   Alert,  Well-developed, obese, pleasant and cooperative in NAD Eyes:  Sclera clear, no icterus.   Conjunctiva pink. Mouth:  No deformity or lesions, oropharynx pink and moist. Neck:  Supple; no masses or thyromegaly. Heart:  Regular rate and rhythm; no murmurs, clicks, rubs,  or gallops. Abdomen:  Normal bowel sounds.  No bruits.  Soft, non-tender and non-distended without masses, hepatosplenomegaly or hernias noted.  No guarding or rebound tenderness.   Msk:  Symmetrical without  gross deformities.  Pulses:  Normal pulses noted. Extremities:  No clubbing or edema. Neurologic:  Alert and oriented x4;  grossly normal neurologically. Skin:  Intact without significant lesions or rashes.

## 2011-08-12 NOTE — Assessment & Plan Note (Signed)
Resolved. Hemoglobin remains low a 0.9. She has been started on iron by PCP.

## 2011-08-12 NOTE — Assessment & Plan Note (Signed)
She is scheduled for CBC in one month through Dr.Luking's office. Requested she have a copy of her lab work faxed here. She is to call if she has any further bleeding.

## 2011-08-19 DIAGNOSIS — D649 Anemia, unspecified: Secondary | ICD-10-CM | POA: Diagnosis not present

## 2011-08-20 NOTE — Progress Notes (Signed)
HGB 11.1 on 08/19/11-much improved Pt on recall list for TCS 3/14

## 2011-09-01 DIAGNOSIS — D510 Vitamin B12 deficiency anemia due to intrinsic factor deficiency: Secondary | ICD-10-CM | POA: Diagnosis not present

## 2011-09-07 NOTE — Progress Notes (Signed)
REVIEWED.  

## 2011-10-08 DIAGNOSIS — R300 Dysuria: Secondary | ICD-10-CM | POA: Diagnosis not present

## 2011-10-08 DIAGNOSIS — N390 Urinary tract infection, site not specified: Secondary | ICD-10-CM | POA: Diagnosis not present

## 2011-10-25 DIAGNOSIS — R300 Dysuria: Secondary | ICD-10-CM | POA: Diagnosis not present

## 2011-10-25 DIAGNOSIS — N390 Urinary tract infection, site not specified: Secondary | ICD-10-CM | POA: Diagnosis not present

## 2011-10-25 DIAGNOSIS — R32 Unspecified urinary incontinence: Secondary | ICD-10-CM | POA: Diagnosis not present

## 2011-11-03 LAB — PULMONARY FUNCTION TEST

## 2011-11-04 DIAGNOSIS — H35359 Cystoid macular degeneration, unspecified eye: Secondary | ICD-10-CM | POA: Diagnosis not present

## 2011-11-04 DIAGNOSIS — E11339 Type 2 diabetes mellitus with moderate nonproliferative diabetic retinopathy without macular edema: Secondary | ICD-10-CM | POA: Diagnosis not present

## 2011-11-04 DIAGNOSIS — E11311 Type 2 diabetes mellitus with unspecified diabetic retinopathy with macular edema: Secondary | ICD-10-CM | POA: Diagnosis not present

## 2011-11-04 DIAGNOSIS — E1139 Type 2 diabetes mellitus with other diabetic ophthalmic complication: Secondary | ICD-10-CM | POA: Diagnosis not present

## 2011-11-04 DIAGNOSIS — E11349 Type 2 diabetes mellitus with severe nonproliferative diabetic retinopathy without macular edema: Secondary | ICD-10-CM | POA: Diagnosis not present

## 2011-11-15 DIAGNOSIS — I2510 Atherosclerotic heart disease of native coronary artery without angina pectoris: Secondary | ICD-10-CM | POA: Diagnosis not present

## 2011-11-15 DIAGNOSIS — E119 Type 2 diabetes mellitus without complications: Secondary | ICD-10-CM | POA: Diagnosis not present

## 2011-11-15 DIAGNOSIS — E785 Hyperlipidemia, unspecified: Secondary | ICD-10-CM | POA: Diagnosis not present

## 2011-11-15 DIAGNOSIS — I119 Hypertensive heart disease without heart failure: Secondary | ICD-10-CM | POA: Diagnosis not present

## 2011-11-16 DIAGNOSIS — E11359 Type 2 diabetes mellitus with proliferative diabetic retinopathy without macular edema: Secondary | ICD-10-CM | POA: Diagnosis not present

## 2011-11-16 DIAGNOSIS — H43399 Other vitreous opacities, unspecified eye: Secondary | ICD-10-CM | POA: Diagnosis not present

## 2011-11-16 DIAGNOSIS — E11311 Type 2 diabetes mellitus with unspecified diabetic retinopathy with macular edema: Secondary | ICD-10-CM | POA: Diagnosis not present

## 2011-11-16 DIAGNOSIS — E1139 Type 2 diabetes mellitus with other diabetic ophthalmic complication: Secondary | ICD-10-CM | POA: Diagnosis not present

## 2011-12-08 DIAGNOSIS — E119 Type 2 diabetes mellitus without complications: Secondary | ICD-10-CM | POA: Diagnosis not present

## 2011-12-08 DIAGNOSIS — T07XXXA Unspecified multiple injuries, initial encounter: Secondary | ICD-10-CM | POA: Diagnosis not present

## 2011-12-14 DIAGNOSIS — I119 Hypertensive heart disease without heart failure: Secondary | ICD-10-CM | POA: Diagnosis not present

## 2011-12-14 DIAGNOSIS — E785 Hyperlipidemia, unspecified: Secondary | ICD-10-CM | POA: Diagnosis not present

## 2011-12-14 DIAGNOSIS — I2510 Atherosclerotic heart disease of native coronary artery without angina pectoris: Secondary | ICD-10-CM | POA: Diagnosis not present

## 2011-12-14 DIAGNOSIS — E119 Type 2 diabetes mellitus without complications: Secondary | ICD-10-CM | POA: Diagnosis not present

## 2011-12-21 DIAGNOSIS — H35359 Cystoid macular degeneration, unspecified eye: Secondary | ICD-10-CM | POA: Diagnosis not present

## 2011-12-21 DIAGNOSIS — Z9889 Other specified postprocedural states: Secondary | ICD-10-CM | POA: Diagnosis not present

## 2012-01-04 DIAGNOSIS — H6120 Impacted cerumen, unspecified ear: Secondary | ICD-10-CM | POA: Diagnosis not present

## 2012-01-06 DIAGNOSIS — S20219A Contusion of unspecified front wall of thorax, initial encounter: Secondary | ICD-10-CM | POA: Diagnosis not present

## 2012-01-17 DIAGNOSIS — I2510 Atherosclerotic heart disease of native coronary artery without angina pectoris: Secondary | ICD-10-CM | POA: Diagnosis not present

## 2012-01-17 DIAGNOSIS — E119 Type 2 diabetes mellitus without complications: Secondary | ICD-10-CM | POA: Diagnosis not present

## 2012-01-17 DIAGNOSIS — E039 Hypothyroidism, unspecified: Secondary | ICD-10-CM | POA: Diagnosis not present

## 2012-01-17 DIAGNOSIS — I119 Hypertensive heart disease without heart failure: Secondary | ICD-10-CM | POA: Diagnosis not present

## 2012-01-17 DIAGNOSIS — E785 Hyperlipidemia, unspecified: Secondary | ICD-10-CM | POA: Diagnosis not present

## 2012-02-15 DIAGNOSIS — R0989 Other specified symptoms and signs involving the circulatory and respiratory systems: Secondary | ICD-10-CM | POA: Diagnosis not present

## 2012-02-15 DIAGNOSIS — I359 Nonrheumatic aortic valve disorder, unspecified: Secondary | ICD-10-CM | POA: Diagnosis not present

## 2012-02-18 DIAGNOSIS — E669 Obesity, unspecified: Secondary | ICD-10-CM | POA: Diagnosis not present

## 2012-02-18 DIAGNOSIS — I509 Heart failure, unspecified: Secondary | ICD-10-CM | POA: Diagnosis not present

## 2012-02-18 DIAGNOSIS — I119 Hypertensive heart disease without heart failure: Secondary | ICD-10-CM | POA: Diagnosis not present

## 2012-03-03 DIAGNOSIS — E669 Obesity, unspecified: Secondary | ICD-10-CM | POA: Diagnosis not present

## 2012-03-03 DIAGNOSIS — I119 Hypertensive heart disease without heart failure: Secondary | ICD-10-CM | POA: Diagnosis not present

## 2012-03-16 DIAGNOSIS — Z79899 Other long term (current) drug therapy: Secondary | ICD-10-CM | POA: Diagnosis not present

## 2012-03-16 DIAGNOSIS — M25579 Pain in unspecified ankle and joints of unspecified foot: Secondary | ICD-10-CM | POA: Diagnosis not present

## 2012-03-16 DIAGNOSIS — Z23 Encounter for immunization: Secondary | ICD-10-CM | POA: Diagnosis not present

## 2012-03-16 DIAGNOSIS — I10 Essential (primary) hypertension: Secondary | ICD-10-CM | POA: Diagnosis not present

## 2012-03-16 DIAGNOSIS — G5790 Unspecified mononeuropathy of unspecified lower limb: Secondary | ICD-10-CM | POA: Diagnosis not present

## 2012-03-27 DIAGNOSIS — E1139 Type 2 diabetes mellitus with other diabetic ophthalmic complication: Secondary | ICD-10-CM | POA: Diagnosis not present

## 2012-03-27 DIAGNOSIS — E11311 Type 2 diabetes mellitus with unspecified diabetic retinopathy with macular edema: Secondary | ICD-10-CM | POA: Diagnosis not present

## 2012-04-17 DIAGNOSIS — H35359 Cystoid macular degeneration, unspecified eye: Secondary | ICD-10-CM | POA: Diagnosis not present

## 2012-04-17 DIAGNOSIS — E11311 Type 2 diabetes mellitus with unspecified diabetic retinopathy with macular edema: Secondary | ICD-10-CM | POA: Diagnosis not present

## 2012-04-25 DIAGNOSIS — H35359 Cystoid macular degeneration, unspecified eye: Secondary | ICD-10-CM | POA: Diagnosis not present

## 2012-04-25 DIAGNOSIS — H353290 Exudative age-related macular degeneration, unspecified eye, stage unspecified: Secondary | ICD-10-CM | POA: Diagnosis not present

## 2012-04-25 DIAGNOSIS — E11311 Type 2 diabetes mellitus with unspecified diabetic retinopathy with macular edema: Secondary | ICD-10-CM | POA: Diagnosis not present

## 2012-05-09 DIAGNOSIS — E11349 Type 2 diabetes mellitus with severe nonproliferative diabetic retinopathy without macular edema: Secondary | ICD-10-CM | POA: Diagnosis not present

## 2012-05-09 DIAGNOSIS — H35359 Cystoid macular degeneration, unspecified eye: Secondary | ICD-10-CM | POA: Diagnosis not present

## 2012-05-09 DIAGNOSIS — E11311 Type 2 diabetes mellitus with unspecified diabetic retinopathy with macular edema: Secondary | ICD-10-CM | POA: Diagnosis not present

## 2012-05-09 DIAGNOSIS — E1139 Type 2 diabetes mellitus with other diabetic ophthalmic complication: Secondary | ICD-10-CM | POA: Diagnosis not present

## 2012-05-18 DIAGNOSIS — E1139 Type 2 diabetes mellitus with other diabetic ophthalmic complication: Secondary | ICD-10-CM | POA: Diagnosis not present

## 2012-05-18 DIAGNOSIS — E11311 Type 2 diabetes mellitus with unspecified diabetic retinopathy with macular edema: Secondary | ICD-10-CM | POA: Diagnosis not present

## 2012-06-01 ENCOUNTER — Other Ambulatory Visit: Payer: Self-pay | Admitting: Family Medicine

## 2012-06-01 DIAGNOSIS — Z139 Encounter for screening, unspecified: Secondary | ICD-10-CM

## 2012-06-05 ENCOUNTER — Ambulatory Visit (HOSPITAL_COMMUNITY)
Admission: RE | Admit: 2012-06-05 | Discharge: 2012-06-05 | Disposition: A | Discharge status: Home / Self Care | Payer: Medicare Other | Source: Ambulatory Visit | Attending: Family Medicine | Admitting: Family Medicine

## 2012-06-05 DIAGNOSIS — Z1231 Encounter for screening mammogram for malignant neoplasm of breast: Secondary | ICD-10-CM | POA: Diagnosis not present

## 2012-06-05 DIAGNOSIS — Z139 Encounter for screening, unspecified: Secondary | ICD-10-CM

## 2012-07-13 ENCOUNTER — Encounter: Payer: Self-pay | Admitting: *Deleted

## 2012-07-17 ENCOUNTER — Other Ambulatory Visit: Payer: Self-pay | Admitting: Family Medicine

## 2012-07-17 DIAGNOSIS — R1031 Right lower quadrant pain: Secondary | ICD-10-CM

## 2012-07-19 ENCOUNTER — Ambulatory Visit (HOSPITAL_COMMUNITY)
Admission: RE | Admit: 2012-07-19 | Discharge: 2012-07-19 | Disposition: A | Discharge status: Home / Self Care | Payer: Medicare Other | Source: Ambulatory Visit | Attending: Family Medicine | Admitting: Family Medicine

## 2012-07-19 ENCOUNTER — Other Ambulatory Visit: Payer: Self-pay | Admitting: Family Medicine

## 2012-07-19 DIAGNOSIS — R1031 Right lower quadrant pain: Secondary | ICD-10-CM | POA: Diagnosis not present

## 2012-07-19 DIAGNOSIS — N281 Cyst of kidney, acquired: Secondary | ICD-10-CM | POA: Diagnosis not present

## 2012-07-19 DIAGNOSIS — R9389 Abnormal findings on diagnostic imaging of other specified body structures: Secondary | ICD-10-CM | POA: Diagnosis not present

## 2012-07-20 DIAGNOSIS — H2510 Age-related nuclear cataract, unspecified eye: Secondary | ICD-10-CM | POA: Diagnosis not present

## 2012-07-20 DIAGNOSIS — E1165 Type 2 diabetes mellitus with hyperglycemia: Secondary | ICD-10-CM | POA: Diagnosis not present

## 2012-07-20 DIAGNOSIS — H538 Other visual disturbances: Secondary | ICD-10-CM | POA: Diagnosis not present

## 2012-07-20 DIAGNOSIS — E11319 Type 2 diabetes mellitus with unspecified diabetic retinopathy without macular edema: Secondary | ICD-10-CM | POA: Diagnosis not present

## 2012-08-15 ENCOUNTER — Encounter: Payer: Self-pay | Admitting: Internal Medicine

## 2012-09-14 DIAGNOSIS — E11359 Type 2 diabetes mellitus with proliferative diabetic retinopathy without macular edema: Secondary | ICD-10-CM | POA: Diagnosis not present

## 2012-09-14 DIAGNOSIS — E11311 Type 2 diabetes mellitus with unspecified diabetic retinopathy with macular edema: Secondary | ICD-10-CM | POA: Diagnosis not present

## 2012-09-14 DIAGNOSIS — H35359 Cystoid macular degeneration, unspecified eye: Secondary | ICD-10-CM | POA: Diagnosis not present

## 2012-09-14 DIAGNOSIS — E11349 Type 2 diabetes mellitus with severe nonproliferative diabetic retinopathy without macular edema: Secondary | ICD-10-CM | POA: Diagnosis not present

## 2012-09-14 DIAGNOSIS — E1139 Type 2 diabetes mellitus with other diabetic ophthalmic complication: Secondary | ICD-10-CM | POA: Diagnosis not present

## 2012-09-27 DIAGNOSIS — I119 Hypertensive heart disease without heart failure: Secondary | ICD-10-CM | POA: Diagnosis not present

## 2012-09-27 DIAGNOSIS — E785 Hyperlipidemia, unspecified: Secondary | ICD-10-CM | POA: Diagnosis not present

## 2012-09-27 DIAGNOSIS — I2510 Atherosclerotic heart disease of native coronary artery without angina pectoris: Secondary | ICD-10-CM | POA: Diagnosis not present

## 2012-10-10 DIAGNOSIS — E11359 Type 2 diabetes mellitus with proliferative diabetic retinopathy without macular edema: Secondary | ICD-10-CM | POA: Diagnosis not present

## 2012-10-10 DIAGNOSIS — E11311 Type 2 diabetes mellitus with unspecified diabetic retinopathy with macular edema: Secondary | ICD-10-CM | POA: Diagnosis not present

## 2012-10-10 DIAGNOSIS — E1139 Type 2 diabetes mellitus with other diabetic ophthalmic complication: Secondary | ICD-10-CM | POA: Diagnosis not present

## 2012-10-18 ENCOUNTER — Encounter: Payer: Self-pay | Admitting: *Deleted

## 2012-11-09 ENCOUNTER — Other Ambulatory Visit: Payer: Self-pay | Admitting: *Deleted

## 2012-11-09 MED ORDER — GLIPIZIDE 5 MG PO TABS: 5.0000 mg | ORAL_TABLET | Freq: Every day | ORAL | Status: DC

## 2012-11-23 DIAGNOSIS — I359 Nonrheumatic aortic valve disorder, unspecified: Secondary | ICD-10-CM | POA: Diagnosis not present

## 2012-11-23 DIAGNOSIS — R0989 Other specified symptoms and signs involving the circulatory and respiratory systems: Secondary | ICD-10-CM | POA: Diagnosis not present

## 2012-11-28 ENCOUNTER — Ambulatory Visit (INDEPENDENT_AMBULATORY_CARE_PROVIDER_SITE_OTHER): Payer: Medicare Other | Admitting: Family Medicine

## 2012-11-28 ENCOUNTER — Ambulatory Visit (INDEPENDENT_AMBULATORY_CARE_PROVIDER_SITE_OTHER): Payer: Medicare Other | Admitting: Gastroenterology

## 2012-11-28 ENCOUNTER — Encounter: Payer: Self-pay | Admitting: Gastroenterology

## 2012-11-28 ENCOUNTER — Encounter: Payer: Self-pay | Admitting: Family Medicine

## 2012-11-28 ENCOUNTER — Ambulatory Visit: Payer: Medicare Other | Admitting: Gastroenterology

## 2012-11-28 VITALS — BP 149/78 | HR 68 | Temp 98.4°F | Ht 63.0 in | Wt 208.0 lb

## 2012-11-28 VITALS — BP 130/78 | Wt 208.4 lb

## 2012-11-28 DIAGNOSIS — D126 Benign neoplasm of colon, unspecified: Secondary | ICD-10-CM | POA: Diagnosis not present

## 2012-11-28 DIAGNOSIS — I10 Essential (primary) hypertension: Secondary | ICD-10-CM | POA: Diagnosis not present

## 2012-11-28 DIAGNOSIS — Z79899 Other long term (current) drug therapy: Secondary | ICD-10-CM

## 2012-11-28 DIAGNOSIS — E782 Mixed hyperlipidemia: Secondary | ICD-10-CM | POA: Diagnosis not present

## 2012-11-28 DIAGNOSIS — E119 Type 2 diabetes mellitus without complications: Secondary | ICD-10-CM

## 2012-11-28 DIAGNOSIS — K635 Polyp of colon: Secondary | ICD-10-CM

## 2012-11-28 LAB — POCT GLYCOSYLATED HEMOGLOBIN (HGB A1C): Hemoglobin A1C: 6.9

## 2012-11-28 MED ORDER — PEG 3350-KCL-NA BICARB-NACL 420 G PO SOLR: 4000.0000 mL | ORAL | Status: DC

## 2012-11-28 NOTE — Progress Notes (Signed)
CC PCP 

## 2012-11-28 NOTE — Patient Instructions (Addendum)
1. We have scheduled you for a colonoscopy with Dr. Jena Gauss. Please see separate instructions.

## 2012-11-28 NOTE — Assessment & Plan Note (Signed)
78 year old lady with history of tubular adenomas of the colon. Last colonoscopy in 2003, she had a 4 mm polyp in the ascending colon across from the ICV which could not be removed due to approach in the setting of acute diverticulitis and diverticular bleed. She is back now for followup colonoscopy with polypectomy.  I have discussed the risks, alternatives, benefits with regards to but not limited to the risk of reaction to medication, bleeding, infection, perforation and the patient is agreeable to proceed. Written consent to be obtained.

## 2012-11-28 NOTE — Progress Notes (Signed)
Primary Care Physician:  Lilyan Punt, MD  Primary Gastroenterologist:  Roetta Sessions, MD   Chief Complaint  Patient presents with  . Follow-up    HPI:  Shelly Jackson is a 78 y.o. female here to schedule followup colonoscopy. She has a history of a 4 mm polyp at the proximal acsending colon which could not be removed due to difficulty approach at time of colonoscopy in March 2013. At that time she had presented with acute diverticulitis, likely diverticular bleed.  07/24/2011  Dr. Aris Everts MM polyp at proximal ascending colon could not be removed because of difficult approach, pancolonic diverticulosis with diverticulitis involving the ascending colon, and suspected diverticular bleed, 7 mm polyp snared from proximal sigmoid colon with hemorrhagic surface found to be a tubular adenoma, small external hemorrhoids   She has been doing well. Denies melena, rectal bleeding, abdominal pain, constipation, diarrhea, heartburn, dysphagia, vomiting.   Current Outpatient Prescriptions  Medication Sig Dispense Refill  . ALPRAZolam (XANAX) 0.5 MG tablet Take 0.5 mg by mouth at bedtime as needed for sleep.      Marland Kitchen aspirin 81 MG tablet Take 81 mg by mouth daily.      . citalopram (CELEXA) 20 MG tablet Take 20 mg by mouth daily.      . fexofenadine (ALLEGRA) 60 MG tablet Take 60 mg by mouth as needed.      Marland Kitchen glipiZIDE (GLUCOTROL) 5 MG tablet Take by mouth daily.      . insulin glargine (LANTUS) 100 UNIT/ML injection Inject 45 Units into the skin at bedtime.       . metFORMIN (GLUCOPHAGE) 1000 MG tablet Take 1,000 mg by mouth 2 (two) times daily with a meal. Patient takes 1 tablet in the morning and 1/2 tablet at night      . NIFEdipine (PROCARDIA-XL/ADALAT-CC/NIFEDICAL-XL) 30 MG 24 hr tablet Take 30 mg by mouth daily.      . NON FORMULARY Fish Oil     One tablet daily      . NON FORMULARY Vitamin C   One tablet daily      . NON FORMULARY Magnesium   One tablet daily      .  olmesartan-hydrochlorothiazide (BENICAR HCT) 40-25 MG per tablet Take 2 tablets by mouth daily.       Marland Kitchen oxybutynin (DITROPAN-XL) 5 MG 24 hr tablet Take 5 mg by mouth daily.      . pravastatin (PRAVACHOL) 80 MG tablet Take 80 mg by mouth at bedtime.       No current facility-administered medications for this visit.    Allergies as of 11/28/2012 - Review Complete 11/28/2012  Allergen Reaction Noted  . Daypro (oxaprozin)  10/18/2012    Past Medical History  Diagnosis Date  . Diabetes mellitus   . Hypertension   . Acid reflux   . Rheumatoid arthritis(714.0)   . Diverticulitis 07/24/11    Inpatient [facility]  . Anemia     Secondary to acute blood loss  . Tubular adenoma of colon 07/24/11  . Thrombocytopenia   . Obesity   . Sleep apnea   . CHF (congestive heart failure)   . Diabetic retinopathy     Past Surgical History  Procedure Laterality Date  . Cholecystectomy    . Appendectomy    . Colonoscopy  07/24/2011    Dr. Aris Everts MM polyp at proximal ascending colon could not be removed because of difficult approach, pancolonic diverticulosis with diverticulitis involving the ascending colon, and suspected diverticular bleed, 7 mm polyp  snared from proximal sigmoid colon with hemorrhagic surface found to be a tubular adenoma, small external hemorrhoids  . Colonoscopy  08/2007    Dr. Garnetta Buddy hyperplastic polyp  . Tonsillectomy      Family History  Problem Relation Age of Onset  . Coronary artery disease Father   . Heart attack Father   . Cirrhosis Mother   . Hypertension Mother     History   Social History  . Marital Status: Widowed    Spouse Name: N/A    Number of Children: 2  . Years of Education: N/A   Occupational History  . Retired    Social History Main Topics  . Smoking status: Never Smoker   . Smokeless tobacco: Not on file     Comment: Never  . Alcohol Use: No  . Drug Use: No  . Sexually Active: No   Other Topics Concern  . Not on file   Social History  Narrative  . No narrative on file      ROS:  General: Negative for anorexia, weight loss, fever, chills, fatigue, weakness. Eyes: Negative for vision changes.  ENT: Negative for hoarseness, difficulty swallowing , nasal congestion. CV: Negative for chest pain, angina, palpitations, dyspnea on exertion, peripheral edema.  Respiratory: Negative for dyspnea at rest, dyspnea on exertion, cough, sputum, wheezing.  GI: See history of present illness. GU:  Negative for dysuria, hematuria, urinary incontinence, urinary frequency, nocturnal urination.  MS: Negative for joint pain, low back pain.  Derm: Negative for rash or itching.  Neuro: Negative for weakness, abnormal sensation, seizure, frequent headaches, memory loss, confusion.  Psych: Negative for anxiety, depression, suicidal ideation, hallucinations.  Endo: Negative for unusual weight change.  Heme: Negative for bruising or bleeding. Allergy: Negative for rash or hives.    Physical Examination:  BP 149/78  Pulse 68  Temp(Src) 98.4 F (36.9 C) (Oral)  Ht 5\' 3"  (1.6 m)  Wt 208 lb (94.348 kg)  BMI 36.85 kg/m2   General: Well-nourished, well-developed in no acute distress.  Head: Normocephalic, atraumatic.   Eyes: Conjunctiva pink, no icterus. Mouth: Oropharyngeal mucosa moist and pink , no lesions erythema or exudate. Neck: Supple without thyromegaly, masses, or lymphadenopathy.  Lungs: Clear to auscultation bilaterally.  Heart: Regular rate and rhythm, no murmurs rubs or gallops.  Abdomen: Bowel sounds are normal, nontender, nondistended, no hepatosplenomegaly or masses, no abdominal bruits or    hernia , no rebound or guarding.   Rectal: Not performed Extremities: No lower extremity edema. No clubbing or deformities.  Neuro: Alert and oriented x 4 , grossly normal neurologically.  Skin: Warm and dry, no rash or jaundice.   Psych: Alert and cooperative, normal mood and affect.    Imaging Studies: No results  found.

## 2012-11-28 NOTE — Patient Instructions (Signed)
Please check on lisinopril and benecar as discussed and written for you.  Flu vaccine in fall  Follow up in Nov  You have had labs ordered as part of today's visit. These test are necessary for [place] to provide good care. Failure to complete the labs in a timely basis can cause significant risk to your health. It is your responsibility to do the test. These orders are good for 30 days. After 30 days they are cancelled by the system and require new orders. Labs are drawn at [facility], located across from the hospital.Please do your test as soon as possible!! All results are forwarded to you via phone call for urgent findings and via "My Chart" or letter for routine findings.Letters typically take 7 to 10 days for you to receive.If you don't receive results within 2 weeks please call us for the results.  In the future,for chronic visits we encourage you to have labs ordered and completed a few days before your visit so we can have a detailed discussion about the results. Doing labs for chronic health issues ( thyroid,diabetes, cholesterol,etc.) before your visit allows for you to have a more thorough and efficient visit.  Lab orders can be given by our office to you by request.

## 2012-11-28 NOTE — Progress Notes (Addendum)
  Subjective:    Patient ID: Shelly Jackson, female    DOB: August 22, 1934, 78 y.o.   MRN: [id number]  Diabetes She presents for her follow-up diabetic visit. She has type 2 diabetes mellitus. Risk factors for coronary artery disease include diabetes mellitus, dyslipidemia and hypertension.   patient overall doing fairly well she denies any chest tightness pressure pain shortness breath she does state she had echo recently because of a cardiac murmur she has not been told the results of this yet when she finds out she will let us know. She also relates she is trying to watch her diet her sugars in the morning look very good sometimes later in the day or moderately elevated. She also states her she's not exercising much she does try to stay active around the house. She is not actively trying to lose weight although she is somewhat conscious of what she eats. She denies chest tightness pressure pain shortness of breath she denies abdominal pain vomiting or diarrhea.  She has a history of hypertension also hyperlipidemia and diabetes. She does see the cardiologist but may leave that to [place] to do the lab testing. Family history was reviewed and social she does not smoke    Review of Systems See above, no fevers no rashes. No swelling in the feet. Denies headaches. Denies unilateral numbness or weakness    Objective:   Physical Exam Blood pressure stable lungs are clear no crackles heart is regular pulses normal abdomen is soft no guarding or rebound extremities no edema foot exam was completed bunions noted       Assessment & Plan:  #1 diabetes-fairly good control continue current measures watch diet exercise regular basis #2 hypertension good control continue current medications she is to review her medicines with the med list we have if it is any different she is to let us know she should not be on lisinopril and Benicar at the same time she is not sure if she is doing that she will check her  bottles on her next visit she will bring her bottles #3 hyperlipidemia-lab work was ordered follow diet watch medication take on a regular basis #4 obesity cut back on portions increase activity  Addendum-it should be noted that the original documentation was incomplete. Patient does have bunions on both feet and has decreased sensory aspect around that area. She also has significant callus formation on the bunions that has he parents becoming pre-ulcerative. The patient would benefit from having diabetic shoes and inserts.

## 2012-11-30 ENCOUNTER — Encounter (HOSPITAL_COMMUNITY): Payer: Self-pay | Admitting: Pharmacy Technician

## 2012-12-02 ENCOUNTER — Other Ambulatory Visit: Payer: Self-pay

## 2012-12-02 MED ORDER — GLIPIZIDE 5 MG PO TABS: 2.5000 mg | ORAL_TABLET | Freq: Every day | ORAL | Status: DC

## 2012-12-04 ENCOUNTER — Telehealth: Payer: Self-pay | Admitting: Family Medicine

## 2012-12-04 NOTE — Telephone Encounter (Signed)
Done

## 2012-12-04 NOTE — Telephone Encounter (Signed)
Tell patient I agreed. Throw away the lisinopril. Keep followup in November

## 2012-12-04 NOTE — Telephone Encounter (Signed)
Patient is calling to let Dr. Lorin Picket know the name of her Heart Medication is Stopping Lisinopril because she is also taking Benicar.  Thanks

## 2012-12-06 DIAGNOSIS — I10 Essential (primary) hypertension: Secondary | ICD-10-CM | POA: Diagnosis not present

## 2012-12-06 DIAGNOSIS — E119 Type 2 diabetes mellitus without complications: Secondary | ICD-10-CM | POA: Diagnosis not present

## 2012-12-06 DIAGNOSIS — E782 Mixed hyperlipidemia: Secondary | ICD-10-CM | POA: Diagnosis not present

## 2012-12-06 DIAGNOSIS — Z79899 Other long term (current) drug therapy: Secondary | ICD-10-CM | POA: Diagnosis not present

## 2012-12-06 LAB — LIPID PANEL
HDL: 44 mg/dL (ref >39)
Triglycerides: 123 mg/dL (ref <150)

## 2012-12-06 LAB — BASIC METABOLIC PANEL
BUN: 17 mg/dL (ref 6–23)
Chloride: 103 mEq/L (ref 96–112)
Creat: 0.77 mg/dL (ref 0.50–1.10)

## 2012-12-06 LAB — HEPATIC FUNCTION PANEL
AST: 16 U/L (ref 0–37)
Albumin: 3.9 g/dL (ref 3.5–5.2)
Alkaline Phosphatase: 48 U/L (ref 39–117)
Total Bilirubin: 0.5 mg/dL (ref 0.3–1.2)

## 2012-12-08 ENCOUNTER — Encounter: Payer: Self-pay | Admitting: Family Medicine

## 2012-12-14 ENCOUNTER — Encounter (HOSPITAL_COMMUNITY)
Admission: RE | Disposition: A | Discharge status: Home Health | Payer: Self-pay | Source: Ambulatory Visit | Attending: Internal Medicine

## 2012-12-14 ENCOUNTER — Encounter (HOSPITAL_COMMUNITY): Payer: Self-pay | Admitting: *Deleted

## 2012-12-14 ENCOUNTER — Ambulatory Visit (HOSPITAL_COMMUNITY)
Admission: RE | Admit: 2012-12-14 | Discharge: 2012-12-14 | Disposition: A | Discharge status: Home Health | Payer: Medicare Other | Source: Ambulatory Visit | Attending: Internal Medicine | Admitting: Internal Medicine

## 2012-12-14 DIAGNOSIS — Z860100 Personal history of colon polyps, unspecified: Secondary | ICD-10-CM | POA: Insufficient documentation

## 2012-12-14 DIAGNOSIS — I10 Essential (primary) hypertension: Secondary | ICD-10-CM | POA: Insufficient documentation

## 2012-12-14 DIAGNOSIS — K5730 Diverticulosis of large intestine without perforation or abscess without bleeding: Secondary | ICD-10-CM | POA: Insufficient documentation

## 2012-12-14 DIAGNOSIS — Z01812 Encounter for preprocedural laboratory examination: Secondary | ICD-10-CM | POA: Insufficient documentation

## 2012-12-14 DIAGNOSIS — E119 Type 2 diabetes mellitus without complications: Secondary | ICD-10-CM | POA: Diagnosis not present

## 2012-12-14 DIAGNOSIS — D126 Benign neoplasm of colon, unspecified: Secondary | ICD-10-CM

## 2012-12-14 DIAGNOSIS — Z794 Long term (current) use of insulin: Secondary | ICD-10-CM | POA: Insufficient documentation

## 2012-12-14 DIAGNOSIS — K635 Polyp of colon: Secondary | ICD-10-CM

## 2012-12-14 HISTORY — PX: COLONOSCOPY: SHX5424

## 2012-12-14 LAB — GLUCOSE, CAPILLARY: Glucose-Capillary: 132 mg/dL — ABNORMAL HIGH (ref 70–99)

## 2012-12-14 SURGERY — COLONOSCOPY
Anesthesia: Moderate Sedation

## 2012-12-14 MED ORDER — ONDANSETRON HCL 4 MG/2ML IJ SOLN
INTRAMUSCULAR | Status: AC
  Filled 2012-12-14: qty 2

## 2012-12-14 MED ORDER — SODIUM CHLORIDE 0.9 % IV SOLN
INTRAVENOUS | Status: DC
  Administered 2012-12-14: 08:00:00 via INTRAVENOUS

## 2012-12-14 MED ORDER — MIDAZOLAM HCL 5 MG/5ML IJ SOLN
INTRAMUSCULAR | Status: DC | PRN
  Administered 2012-12-14: 2 mg via INTRAVENOUS
  Administered 2012-12-14: 1 mg via INTRAVENOUS
  Administered 2012-12-14: 2 mg via INTRAVENOUS

## 2012-12-14 MED ORDER — MIDAZOLAM HCL 5 MG/5ML IJ SOLN
INTRAMUSCULAR | Status: AC
  Filled 2012-12-14: qty 10

## 2012-12-14 MED ORDER — ONDANSETRON HCL 4 MG/2ML IJ SOLN
INTRAMUSCULAR | Status: DC | PRN
  Administered 2012-12-14: 4 mg via INTRAVENOUS

## 2012-12-14 MED ORDER — STERILE WATER FOR IRRIGATION IR SOLN
Status: DC | PRN
  Administered 2012-12-14: 09:00:00

## 2012-12-14 MED ORDER — MEPERIDINE HCL 100 MG/ML IJ SOLN
INTRAMUSCULAR | Status: AC
  Filled 2012-12-14: qty 1

## 2012-12-14 MED ORDER — MEPERIDINE HCL 100 MG/ML IJ SOLN
INTRAMUSCULAR | Status: DC | PRN
  Administered 2012-12-14: 50 mg via INTRAVENOUS
  Administered 2012-12-14: 25 mg via INTRAVENOUS

## 2012-12-14 NOTE — Interval H&P Note (Signed)
History and Physical Interval Note:  12/14/2012 8:41 AM  Shelly Jackson  has presented today for surgery, with the diagnosis of COLON POLYPS  The various methods of treatment have been discussed with the patient and family. After consideration of risks, benefits and other options for treatment, the patient has consented to  Procedure(s) with comments: COLONOSCOPY (N/A) - 8:30 as a surgical intervention .  The patient's history has been reviewed, patient examined, no change in status, stable for surgery.  I have reviewed the patient's chart and labs.  Questions were answered to the patient's satisfaction.      No change; tcs per plan;  The risks, benefits, limitations, alternatives and imponderables have been reviewed with the patient. Questions have been answered. All parties are agreeable.   Shelly Jackson

## 2012-12-14 NOTE — Op Note (Signed)
[facility] [address] [place], [address]   COLONOSCOPY PROCEDURE REPORT  PATIENT: Shelly, Jackson  MR#:         [id number] BIRTHDATE: 1935/04/22 , 77  yrs. old GENDER: Female ENDOSCOPIST: R.  Roetta Sessions, MD FACP FACG REFERRED BY:  Lilyan Punt, M.D. PROCEDURE DATE:  12/14/2012 PROCEDURE:     Colonoscopy with snare polypectomy  INDICATIONS: history of colonic adenoma  - ascending colon not removed  INFORMED CONSENT:  The risks, benefits, alternatives and imponderables including but not limited to bleeding, perforation as well as the possibility of a missed lesion have been reviewed.  The potential for biopsy, lesion removal, etc. have also been discussed.  Questions have been answered.  All parties agreeable. Please see the history and physical in the medical record for more information.  MEDICATIONS: Versed 5 mg IV and Demerol 75 mg IV in divided doses.. Zofran 4 mg IV  DESCRIPTION OF PROCEDURE:  After a digital rectal exam was performed, the EC-[id number] ([id number])  colonoscope was advanced from the anus through the rectum and colon to the area of the cecum, ileocecal valve and appendiceal orifice.  The cecum was deeply intubated.  These structures were well-seen and photographed for the record.  From the level of the cecum and ileocecal valve, the scope was slowly and cautiously withdrawn.  The mucosal surfaces were carefully surveyed utilizing scope tip deflection to facilitate fold flattening as needed.  The scope was pulled down into the rectum where a thorough examination including retroflexion was performed.    FINDINGS:  Adequate preparation. Normal rectum. Pancolonic diverticulosis. Tortuous colon. (1) 5 mm pedunculated polyp about 6 cm distal to the ileocecal valve; otherwise, the remainder of colonic mucosa appeared normal.  THERAPEUTIC / DIAGNOSTIC MANEUVERS PERFORMED:  The above-mentioned polyp was cold snare  removed.  COMPLICATIONS: none  CECAL WITHDRAWAL TIME:  12 minutes  IMPRESSION:  Diverticulosis. Colonic polyp-removed as described above  RECOMMENDATIONS:        Followup on pathology.   _______________________________ eSigned:  R. Roetta Sessions, MD FACP [facility] 12/14/2012 9:41 AM   CC:

## 2012-12-14 NOTE — H&P (View-Only) (Signed)
Primary Care Physician:  LUKING,SCOTT, MD  Primary Gastroenterologist:  Michael Rourk, MD   Chief Complaint  Patient presents with  . Follow-up    HPI:  Shelly Jackson is a 77 y.o. female here to schedule followup colonoscopy. She has a history of a 4 mm polyp at the proximal acsending colon which could not be removed due to difficulty approach at time of colonoscopy in March 2013. At that time she had presented with acute diverticulitis, likely diverticular bleed.  07/24/2011  Dr. Rehman-4 MM polyp at proximal ascending colon could not be removed because of difficult approach, pancolonic diverticulosis with diverticulitis involving the ascending colon, and suspected diverticular bleed, 7 mm polyp snared from proximal sigmoid colon with hemorrhagic surface found to be a tubular adenoma, small external hemorrhoids   She has been doing well. Denies melena, rectal bleeding, abdominal pain, constipation, diarrhea, heartburn, dysphagia, vomiting.   Current Outpatient Prescriptions  Medication Sig Dispense Refill  . ALPRAZolam (XANAX) 0.5 MG tablet Take 0.5 mg by mouth at bedtime as needed for sleep.      . aspirin 81 MG tablet Take 81 mg by mouth daily.      . citalopram (CELEXA) 20 MG tablet Take 20 mg by mouth daily.      . fexofenadine (ALLEGRA) 60 MG tablet Take 60 mg by mouth as needed.      . glipiZIDE (GLUCOTROL) 5 MG tablet Take by mouth daily.      . insulin glargine (LANTUS) 100 UNIT/ML injection Inject 45 Units into the skin at bedtime.       . metFORMIN (GLUCOPHAGE) 1000 MG tablet Take 1,000 mg by mouth 2 (two) times daily with a meal. Patient takes 1 tablet in the morning and 1/2 tablet at night      . NIFEdipine (PROCARDIA-XL/ADALAT-CC/NIFEDICAL-XL) 30 MG 24 hr tablet Take 30 mg by mouth daily.      . NON FORMULARY Fish Oil     One tablet daily      . NON FORMULARY Vitamin C   One tablet daily      . NON FORMULARY Magnesium   One tablet daily      .  olmesartan-hydrochlorothiazide (BENICAR HCT) 40-25 MG per tablet Take 2 tablets by mouth daily.       . oxybutynin (DITROPAN-XL) 5 MG 24 hr tablet Take 5 mg by mouth daily.      . pravastatin (PRAVACHOL) 80 MG tablet Take 80 mg by mouth at bedtime.       No current facility-administered medications for this visit.    Allergies as of 11/28/2012 - Review Complete 11/28/2012  Allergen Reaction Noted  . Daypro (oxaprozin)  10/18/2012    Past Medical History  Diagnosis Date  . Diabetes mellitus   . Hypertension   . Acid reflux   . Rheumatoid arthritis(714.0)   . Diverticulitis 07/24/11    Inpatient [facility]  . Anemia     Secondary to acute blood loss  . Tubular adenoma of colon 07/24/11  . Thrombocytopenia   . Obesity   . Sleep apnea   . CHF (congestive heart failure)   . Diabetic retinopathy     Past Surgical History  Procedure Laterality Date  . Cholecystectomy    . Appendectomy    . Colonoscopy  07/24/2011    Dr. Rehman-4 MM polyp at proximal ascending colon could not be removed because of difficult approach, pancolonic diverticulosis with diverticulitis involving the ascending colon, and suspected diverticular bleed, 7 mm polyp   snared from proximal sigmoid colon with hemorrhagic surface found to be a tubular adenoma, small external hemorrhoids  . Colonoscopy  08/2007    Dr. Rourk-single hyperplastic polyp  . Tonsillectomy      Family History  Problem Relation Age of Onset  . Coronary artery disease Father   . Heart attack Father   . Cirrhosis Mother   . Hypertension Mother     History   Social History  . Marital Status: Widowed    Spouse Name: N/A    Number of Children: 2  . Years of Education: N/A   Occupational History  . Retired    Social History Main Topics  . Smoking status: Never Smoker   . Smokeless tobacco: Not on file     Comment: Never  . Alcohol Use: No  . Drug Use: No  . Sexually Active: No   Other Topics Concern  . Not on file   Social History  Narrative  . No narrative on file      ROS:  General: Negative for anorexia, weight loss, fever, chills, fatigue, weakness. Eyes: Negative for vision changes.  ENT: Negative for hoarseness, difficulty swallowing , nasal congestion. CV: Negative for chest pain, angina, palpitations, dyspnea on exertion, peripheral edema.  Respiratory: Negative for dyspnea at rest, dyspnea on exertion, cough, sputum, wheezing.  GI: See history of present illness. GU:  Negative for dysuria, hematuria, urinary incontinence, urinary frequency, nocturnal urination.  MS: Negative for joint pain, low back pain.  Derm: Negative for rash or itching.  Neuro: Negative for weakness, abnormal sensation, seizure, frequent headaches, memory loss, confusion.  Psych: Negative for anxiety, depression, suicidal ideation, hallucinations.  Endo: Negative for unusual weight change.  Heme: Negative for bruising or bleeding. Allergy: Negative for rash or hives.    Physical Examination:  BP 149/78  Pulse 68  Temp(Src) 98.4 F (36.9 C) (Oral)  Ht 5' 3" (1.6 m)  Wt 208 lb (94.348 kg)  BMI 36.85 kg/m2   General: Well-nourished, well-developed in no acute distress.  Head: Normocephalic, atraumatic.   Eyes: Conjunctiva pink, no icterus. Mouth: Oropharyngeal mucosa moist and pink , no lesions erythema or exudate. Neck: Supple without thyromegaly, masses, or lymphadenopathy.  Lungs: Clear to auscultation bilaterally.  Heart: Regular rate and rhythm, no murmurs rubs or gallops.  Abdomen: Bowel sounds are normal, nontender, nondistended, no hepatosplenomegaly or masses, no abdominal bruits or    hernia , no rebound or guarding.   Rectal: Not performed Extremities: No lower extremity edema. No clubbing or deformities.  Neuro: Alert and oriented x 4 , grossly normal neurologically.  Skin: Warm and dry, no rash or jaundice.   Psych: Alert and cooperative, normal mood and affect.    Imaging Studies: No results  found.    

## 2012-12-15 ENCOUNTER — Encounter: Payer: Self-pay | Admitting: Internal Medicine

## 2012-12-18 ENCOUNTER — Encounter (HOSPITAL_COMMUNITY): Payer: Self-pay | Admitting: Internal Medicine

## 2012-12-25 ENCOUNTER — Telehealth: Payer: Self-pay | Admitting: Family Medicine

## 2012-12-25 NOTE — Telephone Encounter (Signed)
Rx faxed to pharmacy. Patient notified. 

## 2012-12-25 NOTE — Telephone Encounter (Signed)
Patient calling to check on her request for needles for diabetes    [facility]

## 2013-01-03 ENCOUNTER — Telehealth: Payer: Self-pay | Admitting: Family Medicine

## 2013-01-03 MED ORDER — ALPRAZOLAM 0.5 MG PO TABS: 0.5000 mg | ORAL_TABLET | Freq: Every evening | ORAL | Status: DC | PRN

## 2013-01-03 MED ORDER — METFORMIN HCL 1000 MG PO TABS: ORAL_TABLET | ORAL | Status: DC

## 2013-01-03 NOTE — Telephone Encounter (Signed)
Last office visit 11/28/12

## 2013-01-03 NOTE — Telephone Encounter (Signed)
ALPRAZolam (XANAX) 0.5 MG tablet,      metFORMIN (GLUCOPHAGE) 1000 MG tablet    Pt would like a refill on these scripts sent to [facility]

## 2013-01-03 NOTE — Telephone Encounter (Signed)
RX for metformin and xanax sent to [facility]. Patient was notified.

## 2013-01-03 NOTE — Telephone Encounter (Signed)
May refill times 4 

## 2013-01-25 ENCOUNTER — Other Ambulatory Visit: Payer: Self-pay | Admitting: *Deleted

## 2013-01-25 MED ORDER — INSULIN GLARGINE 100 UNIT/ML ~~LOC~~ SOLN: 45.0000 [IU] | Freq: Every day | SUBCUTANEOUS | Status: DC

## 2013-01-26 ENCOUNTER — Other Ambulatory Visit: Payer: Self-pay

## 2013-01-26 MED ORDER — INSULIN GLARGINE 100 UNIT/ML ~~LOC~~ SOLN: 45.0000 [IU] | Freq: Every day | SUBCUTANEOUS | Status: DC

## 2013-02-03 ENCOUNTER — Other Ambulatory Visit: Payer: Self-pay | Admitting: Family Medicine

## 2013-02-13 DIAGNOSIS — E11311 Type 2 diabetes mellitus with unspecified diabetic retinopathy with macular edema: Secondary | ICD-10-CM | POA: Diagnosis not present

## 2013-02-13 DIAGNOSIS — E1139 Type 2 diabetes mellitus with other diabetic ophthalmic complication: Secondary | ICD-10-CM | POA: Diagnosis not present

## 2013-02-13 DIAGNOSIS — E11359 Type 2 diabetes mellitus with proliferative diabetic retinopathy without macular edema: Secondary | ICD-10-CM | POA: Diagnosis not present

## 2013-02-13 DIAGNOSIS — H35359 Cystoid macular degeneration, unspecified eye: Secondary | ICD-10-CM | POA: Diagnosis not present

## 2013-02-15 DIAGNOSIS — E11311 Type 2 diabetes mellitus with unspecified diabetic retinopathy with macular edema: Secondary | ICD-10-CM | POA: Diagnosis not present

## 2013-02-15 DIAGNOSIS — H35359 Cystoid macular degeneration, unspecified eye: Secondary | ICD-10-CM | POA: Diagnosis not present

## 2013-03-08 ENCOUNTER — Telehealth: Payer: Self-pay | Admitting: Family Medicine

## 2013-03-08 ENCOUNTER — Other Ambulatory Visit: Payer: Self-pay | Admitting: Nurse Practitioner

## 2013-03-08 MED ORDER — PRAVASTATIN SODIUM 80 MG PO TABS: 80.0000 mg | ORAL_TABLET | Freq: Every day | ORAL | Status: DC

## 2013-03-08 MED ORDER — OXYBUTYNIN CHLORIDE ER 5 MG PO TB24: 5.0000 mg | ORAL_TABLET | Freq: Every day | ORAL | Status: DC

## 2013-03-08 NOTE — Telephone Encounter (Signed)
pravastatin (PRAVACHOL) 80 MG tablet  oxybutynin (DITROPAN-XL) 5 MG 24 hr tablet  [facility] [employer] renewal please

## 2013-03-12 ENCOUNTER — Other Ambulatory Visit: Payer: Self-pay

## 2013-03-12 MED ORDER — ALPRAZOLAM 0.5 MG PO TABS: 0.5000 mg | ORAL_TABLET | Freq: Every evening | ORAL | Status: DC | PRN

## 2013-03-15 DIAGNOSIS — E11311 Type 2 diabetes mellitus with unspecified diabetic retinopathy with macular edema: Secondary | ICD-10-CM | POA: Diagnosis not present

## 2013-03-15 DIAGNOSIS — E1139 Type 2 diabetes mellitus with other diabetic ophthalmic complication: Secondary | ICD-10-CM | POA: Diagnosis not present

## 2013-03-15 DIAGNOSIS — E11349 Type 2 diabetes mellitus with severe nonproliferative diabetic retinopathy without macular edema: Secondary | ICD-10-CM | POA: Diagnosis not present

## 2013-03-28 ENCOUNTER — Other Ambulatory Visit: Payer: Self-pay | Admitting: Family Medicine

## 2013-03-28 ENCOUNTER — Telehealth: Payer: Self-pay | Admitting: Family Medicine

## 2013-03-28 NOTE — Telephone Encounter (Signed)
Please notify the patient that we have sent updated information to [employer]. This is being sent over on Thursday. Our apologies regarding this but Medicare is very complex and all of their rules and often makes it difficult. Please tell her thank you for understanding. Finally she ought to followup in December for a checkup January at the latest. Also she should make sure she got her flu shot.

## 2013-03-28 NOTE — Telephone Encounter (Signed)
Patient says [employer] mailed over form to get patients diabetic shoes but have not heard anything back. She wants to make sure we have received this because she would like to get these today.  [employer]

## 2013-03-29 NOTE — Telephone Encounter (Signed)
Left message on voicemail to return call.

## 2013-03-29 NOTE — Telephone Encounter (Signed)
Discussed with patient

## 2013-04-10 ENCOUNTER — Encounter: Payer: Self-pay | Admitting: Family Medicine

## 2013-04-10 ENCOUNTER — Ambulatory Visit (INDEPENDENT_AMBULATORY_CARE_PROVIDER_SITE_OTHER): Payer: Medicare Other | Admitting: Family Medicine

## 2013-04-10 VITALS — BP 128/86 | Ht 62.0 in | Wt 212.0 lb

## 2013-04-10 DIAGNOSIS — E119 Type 2 diabetes mellitus without complications: Secondary | ICD-10-CM

## 2013-04-10 DIAGNOSIS — I10 Essential (primary) hypertension: Secondary | ICD-10-CM | POA: Insufficient documentation

## 2013-04-10 DIAGNOSIS — Z23 Encounter for immunization: Secondary | ICD-10-CM

## 2013-04-10 DIAGNOSIS — E782 Mixed hyperlipidemia: Secondary | ICD-10-CM

## 2013-04-10 DIAGNOSIS — E669 Obesity, unspecified: Secondary | ICD-10-CM

## 2013-04-10 NOTE — Progress Notes (Signed)
   Subjective:    Patient ID: Shelly Jackson, female    DOB: 29-May-1934, 78 y.o.   MRN: [id number]  Diabetes She presents for her follow-up diabetic visit. She has type 2 diabetes mellitus. Her disease course has been stable. There are no hypoglycemic associated symptoms. Associated symptoms include fatigue and foot paresthesias. Pertinent negatives for diabetes include no blurred vision, no chest pain, no foot ulcerations, no polydipsia and no polyphagia. There are no hypoglycemic complications. Risk factors for coronary artery disease include diabetes mellitus, dyslipidemia, family history, sedentary lifestyle, post-menopausal, stress and obesity. Current diabetic treatment includes insulin injections and oral agent (dual therapy). She is compliant with treatment most of the time. Her weight is stable. She is following a diabetic diet. She rarely participates in exercise. There is no change in her home blood glucose trend. She sees a podiatrist.  Patient arrives for a follow up on diabetes. She is frustrated by the high cost of diabetes. She denies any excessively high spells or low sugar spells. Denies excessive thirst or blurry vision. States she is compliant with her medicines.   Review of Systems  Constitutional: Positive for fatigue.  HENT: Negative for rhinorrhea and sinus pressure.   Eyes: Negative for blurred vision.  Respiratory: Negative for cough and shortness of breath.   Cardiovascular: Negative for chest pain.  Endocrine: Negative for polydipsia and polyphagia.  Genitourinary: Negative for frequency and flank pain.       Objective:   Physical Exam  Constitutional: She appears well-developed and well-nourished.  HENT:  Head: Normocephalic.  Mouth/Throat: No oropharyngeal exudate.  Neck: Normal range of motion. Neck supple. No thyromegaly present.  Cardiovascular: Normal rate, regular rhythm and normal heart sounds.   No murmur heard. Pulmonary/Chest: Effort normal and  breath sounds normal. No respiratory distress.  Abdominal: Soft.  Musculoskeletal: She exhibits no edema.  Lymphadenopathy:    She has no cervical adenopathy.  Neurological: She is alert.  Skin: Skin is warm and dry. No erythema.   Neck no masses lungs are clear heart regular abdomen soft extremities no edema skin warm dry Family history reviewed     C. diabetic foot exam Assessment & Plan:  #1 diabetes-treatment measures discussed is very expensive to use Lantus but she will continue to try to do the best she can with it. I don't recommend adding other medicines #2 foot deformities this patient would benefit from diabetic shoes she also has neuropathy #3 cardiac condition stable #4 HTN stable Followup 3 months [id number]

## 2013-04-19 ENCOUNTER — Other Ambulatory Visit: Payer: Self-pay | Admitting: *Deleted

## 2013-04-19 MED ORDER — INSULIN GLARGINE 100 UNIT/ML ~~LOC~~ SOLN: 45.0000 [IU] | Freq: Every day | SUBCUTANEOUS | Status: DC

## 2013-06-06 ENCOUNTER — Other Ambulatory Visit: Payer: Self-pay | Admitting: *Deleted

## 2013-06-06 MED ORDER — GLIPIZIDE 5 MG PO TABS: 2.5000 mg | ORAL_TABLET | Freq: Every day | ORAL | Status: DC

## 2013-06-11 ENCOUNTER — Telehealth: Payer: Self-pay | Admitting: Family Medicine

## 2013-06-11 NOTE — Telephone Encounter (Signed)
Review glucose test log on patient.

## 2013-06-11 NOTE — Telephone Encounter (Signed)
Patient notified and verbalized understanding of the test results. No further questions. 

## 2013-06-11 NOTE — Telephone Encounter (Signed)
Please tell pt that her readings overall look good. [place] Kitchenwatch diet , recheck in [place] later than April

## 2013-07-04 ENCOUNTER — Other Ambulatory Visit: Payer: Self-pay

## 2013-07-04 MED ORDER — INSULIN GLARGINE 100 UNIT/ML ~~LOC~~ SOLN: 45.0000 [IU] | Freq: Every day | SUBCUTANEOUS | Status: DC

## 2013-07-12 DIAGNOSIS — E1139 Type 2 diabetes mellitus with other diabetic ophthalmic complication: Secondary | ICD-10-CM | POA: Diagnosis not present

## 2013-07-12 DIAGNOSIS — E11311 Type 2 diabetes mellitus with unspecified diabetic retinopathy with macular edema: Secondary | ICD-10-CM | POA: Diagnosis not present

## 2013-07-12 DIAGNOSIS — E11359 Type 2 diabetes mellitus with proliferative diabetic retinopathy without macular edema: Secondary | ICD-10-CM | POA: Diagnosis not present

## 2013-07-25 ENCOUNTER — Other Ambulatory Visit: Payer: Self-pay | Admitting: Family Medicine

## 2013-07-26 DIAGNOSIS — E1139 Type 2 diabetes mellitus with other diabetic ophthalmic complication: Secondary | ICD-10-CM | POA: Diagnosis not present

## 2013-07-26 DIAGNOSIS — E11311 Type 2 diabetes mellitus with unspecified diabetic retinopathy with macular edema: Secondary | ICD-10-CM | POA: Diagnosis not present

## 2013-07-26 DIAGNOSIS — H3560 Retinal hemorrhage, unspecified eye: Secondary | ICD-10-CM | POA: Diagnosis not present

## 2013-07-26 DIAGNOSIS — H35359 Cystoid macular degeneration, unspecified eye: Secondary | ICD-10-CM | POA: Diagnosis not present

## 2013-07-26 DIAGNOSIS — E11349 Type 2 diabetes mellitus with severe nonproliferative diabetic retinopathy without macular edema: Secondary | ICD-10-CM | POA: Diagnosis not present

## 2013-07-26 DIAGNOSIS — E11359 Type 2 diabetes mellitus with proliferative diabetic retinopathy without macular edema: Secondary | ICD-10-CM | POA: Diagnosis not present

## 2013-08-01 DIAGNOSIS — R0602 Shortness of breath: Secondary | ICD-10-CM | POA: Diagnosis not present

## 2013-08-01 DIAGNOSIS — I2510 Atherosclerotic heart disease of native coronary artery without angina pectoris: Secondary | ICD-10-CM | POA: Diagnosis not present

## 2013-08-01 DIAGNOSIS — I119 Hypertensive heart disease without heart failure: Secondary | ICD-10-CM | POA: Diagnosis not present

## 2013-08-01 DIAGNOSIS — E785 Hyperlipidemia, unspecified: Secondary | ICD-10-CM | POA: Diagnosis not present

## 2013-08-03 ENCOUNTER — Other Ambulatory Visit: Payer: Self-pay

## 2013-08-03 ENCOUNTER — Other Ambulatory Visit: Payer: Self-pay | Admitting: Family Medicine

## 2013-08-03 DIAGNOSIS — Z139 Encounter for screening, unspecified: Secondary | ICD-10-CM

## 2013-08-17 ENCOUNTER — Encounter: Payer: Self-pay | Admitting: Family Medicine

## 2013-08-17 ENCOUNTER — Ambulatory Visit (INDEPENDENT_AMBULATORY_CARE_PROVIDER_SITE_OTHER): Payer: Medicare Other | Admitting: Family Medicine

## 2013-08-17 VITALS — BP 146/80 | Ht 62.0 in | Wt 209.0 lb

## 2013-08-17 DIAGNOSIS — E782 Mixed hyperlipidemia: Secondary | ICD-10-CM

## 2013-08-17 DIAGNOSIS — I10 Essential (primary) hypertension: Secondary | ICD-10-CM | POA: Diagnosis not present

## 2013-08-17 DIAGNOSIS — M810 Age-related osteoporosis without current pathological fracture: Secondary | ICD-10-CM | POA: Diagnosis not present

## 2013-08-17 DIAGNOSIS — E669 Obesity, unspecified: Secondary | ICD-10-CM

## 2013-08-17 DIAGNOSIS — E119 Type 2 diabetes mellitus without complications: Secondary | ICD-10-CM

## 2013-08-17 LAB — POCT GLYCOSYLATED HEMOGLOBIN (HGB A1C): Hemoglobin A1C: 6.5

## 2013-08-17 MED ORDER — LOSARTAN POTASSIUM-HCTZ 50-12.5 MG PO TABS: 1.0000 | ORAL_TABLET | Freq: Every day | ORAL | Status: DC

## 2013-08-17 NOTE — Progress Notes (Signed)
   Subjective:    Patient ID: Shelly Jackson, female    DOB: August 15, 1934, 79 y.o.   MRN: [id number]  Diabetes She presents for her follow-up diabetic visit. She has type 2 diabetes mellitus. There are no hypoglycemic associated symptoms. Associated symptoms include fatigue. Diabetic complications include retinopathy. Current diabetic treatment includes insulin injections and oral agent (dual therapy). She is compliant with treatment all of the time. Her overall blood glucose range is 110-130 mg/dl. Eye exam is current.   I did go ahead and review of her glucose readings and they do look good. Her A1c is excellent at 6.5. She denies any low sugar spells. She denies being depressed she states her moods are doing okay. She does have a history of significant health problems including diabetes hypertension hyperlipidemia obesity and anemia.  She does relate that she does get dizzy when she stands up she thought it could be in her ear she wasn't sure sometimes the room spins but most of the time it's just feels unsteady when she stands  Review of Systems  Constitutional: Positive for fatigue.   she does relates he gets a little short of breath when she moves around she feels that it's related to deconditioning and her obesity. She does see the cardiologist on intermittent basis and so far things have been looking okay stable. She denies rectal bleeding denies vomiting denies chest pressure tightness she does relate some joint discomforts.     Objective:   Physical Exam Orthostatic blood pressures were taken both laying sitting and standing laying and sitting her blood pressure stays around the same 130/86 the when she stood it dropped to 120/70 Lungs are clear there is no crackles Heart is regular pulse normal Abdomen soft obese Extremities no edema Diabetic foot exam completed. Neurologic no focal deficits noted. Patient has difficulty getting onto and off the exam table some balance  issues       Assessment & Plan:  #1 slight ataxia related to deconditioning and obesity and age I recommend that we use a cane a prescription was given to her #2 diabetes excellent control perhaps too good stop glipizide to avoid hypoglycemia possibilities recheck her again in 3-4 months #3 hyperlipidemia continue current medications actually her readings in the past looked good we will do comprehensive lab work in 3-4 months continue current measures #4 orthostatic issues actually decreased blood pressure medicines I believe this should help her avoid the significant orthostasis. #5 bone density today ordered

## 2013-08-23 ENCOUNTER — Other Ambulatory Visit: Payer: Self-pay | Admitting: Family Medicine

## 2013-08-23 ENCOUNTER — Ambulatory Visit (HOSPITAL_COMMUNITY)
Admission: RE | Admit: 2013-08-23 | Discharge: 2013-08-23 | Disposition: A | Discharge status: Home / Self Care | Payer: Medicare Other | Source: Ambulatory Visit | Attending: Family Medicine | Admitting: Family Medicine

## 2013-08-23 DIAGNOSIS — Z1231 Encounter for screening mammogram for malignant neoplasm of breast: Secondary | ICD-10-CM | POA: Insufficient documentation

## 2013-08-28 ENCOUNTER — Ambulatory Visit (HOSPITAL_COMMUNITY)
Admission: RE | Admit: 2013-08-28 | Discharge: 2013-08-28 | Disposition: A | Discharge status: Home / Self Care | Payer: Medicare Other | Source: Ambulatory Visit | Attending: Family Medicine | Admitting: Family Medicine

## 2013-08-28 DIAGNOSIS — Z13820 Encounter for screening for osteoporosis: Secondary | ICD-10-CM | POA: Diagnosis not present

## 2013-08-28 DIAGNOSIS — M810 Age-related osteoporosis without current pathological fracture: Secondary | ICD-10-CM | POA: Insufficient documentation

## 2013-08-30 DIAGNOSIS — E11311 Type 2 diabetes mellitus with unspecified diabetic retinopathy with macular edema: Secondary | ICD-10-CM | POA: Diagnosis not present

## 2013-08-30 DIAGNOSIS — H35359 Cystoid macular degeneration, unspecified eye: Secondary | ICD-10-CM | POA: Diagnosis not present

## 2013-08-30 DIAGNOSIS — E1139 Type 2 diabetes mellitus with other diabetic ophthalmic complication: Secondary | ICD-10-CM | POA: Diagnosis not present

## 2013-09-05 ENCOUNTER — Other Ambulatory Visit: Payer: Self-pay | Admitting: Family Medicine

## 2013-09-05 ENCOUNTER — Telehealth: Payer: Self-pay | Admitting: *Deleted

## 2013-09-05 MED ORDER — ALPRAZOLAM 0.5 MG PO TABS: 0.5000 mg | ORAL_TABLET | Freq: Every evening | ORAL | Status: DC | PRN

## 2013-09-05 NOTE — Telephone Encounter (Signed)
This pt may have 90 day and 2 refills

## 2013-09-05 NOTE — Telephone Encounter (Signed)
ALPRAZolam (XANAX) 0.5 MG tablet   #90 tablets  Take 1 tablet (0.5 mg total) by mouth at bedtime as needed for sleep.   Last seen: 4/10

## 2013-09-05 NOTE — Telephone Encounter (Signed)
Rx faxed to pharmacy  

## 2013-09-05 NOTE — Addendum Note (Signed)
Addended by: Dairl Ponder on: 09/05/2013 11:32 AM   Modules accepted: Orders

## 2013-09-13 DIAGNOSIS — E1139 Type 2 diabetes mellitus with other diabetic ophthalmic complication: Secondary | ICD-10-CM | POA: Diagnosis not present

## 2013-09-13 DIAGNOSIS — E11311 Type 2 diabetes mellitus with unspecified diabetic retinopathy with macular edema: Secondary | ICD-10-CM | POA: Diagnosis not present

## 2013-09-13 DIAGNOSIS — E1165 Type 2 diabetes mellitus with hyperglycemia: Secondary | ICD-10-CM | POA: Diagnosis not present

## 2013-09-29 ENCOUNTER — Other Ambulatory Visit: Payer: Self-pay | Admitting: Family Medicine

## 2013-10-09 DIAGNOSIS — E11311 Type 2 diabetes mellitus with unspecified diabetic retinopathy with macular edema: Secondary | ICD-10-CM | POA: Diagnosis not present

## 2013-10-09 DIAGNOSIS — E1139 Type 2 diabetes mellitus with other diabetic ophthalmic complication: Secondary | ICD-10-CM | POA: Diagnosis not present

## 2013-10-17 DIAGNOSIS — E1139 Type 2 diabetes mellitus with other diabetic ophthalmic complication: Secondary | ICD-10-CM | POA: Diagnosis not present

## 2013-10-17 DIAGNOSIS — E11311 Type 2 diabetes mellitus with unspecified diabetic retinopathy with macular edema: Secondary | ICD-10-CM | POA: Diagnosis not present

## 2013-11-13 DIAGNOSIS — E11311 Type 2 diabetes mellitus with unspecified diabetic retinopathy with macular edema: Secondary | ICD-10-CM | POA: Diagnosis not present

## 2013-11-13 DIAGNOSIS — E1139 Type 2 diabetes mellitus with other diabetic ophthalmic complication: Secondary | ICD-10-CM | POA: Diagnosis not present

## 2013-12-03 DIAGNOSIS — E11311 Type 2 diabetes mellitus with unspecified diabetic retinopathy with macular edema: Secondary | ICD-10-CM | POA: Diagnosis not present

## 2013-12-03 DIAGNOSIS — E1139 Type 2 diabetes mellitus with other diabetic ophthalmic complication: Secondary | ICD-10-CM | POA: Diagnosis not present

## 2013-12-18 DIAGNOSIS — E11311 Type 2 diabetes mellitus with unspecified diabetic retinopathy with macular edema: Secondary | ICD-10-CM | POA: Diagnosis not present

## 2013-12-18 DIAGNOSIS — E1139 Type 2 diabetes mellitus with other diabetic ophthalmic complication: Secondary | ICD-10-CM | POA: Diagnosis not present

## 2013-12-19 ENCOUNTER — Other Ambulatory Visit: Payer: Self-pay | Admitting: *Deleted

## 2013-12-19 MED ORDER — INSULIN GLARGINE 100 UNIT/ML ~~LOC~~ SOLN: 45.0000 [IU] | Freq: Every day | SUBCUTANEOUS | Status: DC

## 2013-12-20 ENCOUNTER — Other Ambulatory Visit: Payer: Self-pay | Admitting: *Deleted

## 2013-12-20 ENCOUNTER — Telehealth: Payer: Self-pay | Admitting: Family Medicine

## 2013-12-20 MED ORDER — INSULIN GLARGINE 100 UNIT/ML ~~LOC~~ SOLN: 45.0000 [IU] | Freq: Every day | SUBCUTANEOUS | Status: DC

## 2013-12-20 NOTE — Telephone Encounter (Signed)
Patient needs Rx for lantus. She said it was originally sent to [facility], but she cancelled it because she needs it today.  [facility] [place]

## 2013-12-20 NOTE — Telephone Encounter (Signed)
Med sent to [facility] in [place]. Pt notified.

## 2014-01-07 ENCOUNTER — Ambulatory Visit (INDEPENDENT_AMBULATORY_CARE_PROVIDER_SITE_OTHER): Payer: Medicare Other | Admitting: Family Medicine

## 2014-01-07 ENCOUNTER — Encounter: Payer: Self-pay | Admitting: Family Medicine

## 2014-01-07 VITALS — BP 124/76 | Ht 62.0 in | Wt 207.5 lb

## 2014-01-07 DIAGNOSIS — I10 Essential (primary) hypertension: Secondary | ICD-10-CM

## 2014-01-07 DIAGNOSIS — E119 Type 2 diabetes mellitus without complications: Secondary | ICD-10-CM

## 2014-01-07 DIAGNOSIS — Z23 Encounter for immunization: Secondary | ICD-10-CM | POA: Diagnosis not present

## 2014-01-07 DIAGNOSIS — Z79899 Other long term (current) drug therapy: Secondary | ICD-10-CM | POA: Diagnosis not present

## 2014-01-07 DIAGNOSIS — E782 Mixed hyperlipidemia: Secondary | ICD-10-CM

## 2014-01-07 LAB — HEPATIC FUNCTION PANEL
ALT: 14 U/L (ref 0–35)
AST: 16 U/L (ref 0–37)
Albumin: 4.1 g/dL (ref 3.5–5.2)
Alkaline Phosphatase: 47 U/L (ref 39–117)
BILIRUBIN INDIRECT: 0.5 mg/dL (ref 0.2–1.2)
BILIRUBIN TOTAL: 0.6 mg/dL (ref 0.2–1.2)
Bilirubin, Direct: 0.1 mg/dL (ref 0.0–0.3)
Total Protein: 6.2 g/dL (ref 6.0–8.3)

## 2014-01-07 LAB — LIPID PANEL
Cholesterol: 112 mg/dL (ref 0–200)
HDL: 41 mg/dL (ref >39)
LDL Cholesterol: 47 mg/dL (ref 0–99)
Total CHOL/HDL Ratio: 2.7 Ratio
Triglycerides: 119 mg/dL (ref <150)
VLDL: 24 mg/dL (ref 0–40)

## 2014-01-07 LAB — BASIC METABOLIC PANEL
BUN: 15 mg/dL (ref 6–23)
CO2: 29 mEq/L (ref 19–32)
Calcium: 8.7 mg/dL (ref 8.4–10.5)
Chloride: 99 mEq/L (ref 96–112)
Creat: 0.7 mg/dL (ref 0.50–1.10)
GLUCOSE: 89 mg/dL (ref 70–99)
Potassium: 4.1 mEq/L (ref 3.5–5.3)
Sodium: 138 mEq/L (ref 135–145)

## 2014-01-07 LAB — POCT GLYCOSYLATED HEMOGLOBIN (HGB A1C): Hemoglobin A1C: 7

## 2014-01-07 MED ORDER — LOSARTAN POTASSIUM-HCTZ 50-12.5 MG PO TABS: 1.0000 | ORAL_TABLET | Freq: Every day | ORAL | Status: DC

## 2014-01-07 MED ORDER — INSULIN GLARGINE 100 UNIT/ML ~~LOC~~ SOLN: 45.0000 [IU] | Freq: Every day | SUBCUTANEOUS | Status: DC

## 2014-01-07 MED ORDER — CITALOPRAM HYDROBROMIDE 20 MG PO TABS: ORAL_TABLET | ORAL | Status: DC

## 2014-01-07 MED ORDER — OXYBUTYNIN CHLORIDE ER 5 MG PO TB24: 5.0000 mg | ORAL_TABLET | Freq: Every day | ORAL | Status: DC

## 2014-01-07 MED ORDER — METFORMIN HCL 1000 MG PO TABS: ORAL_TABLET | ORAL | Status: DC

## 2014-01-07 MED ORDER — PRAVASTATIN SODIUM 80 MG PO TABS: 80.0000 mg | ORAL_TABLET | Freq: Every day | ORAL | Status: DC

## 2014-01-07 NOTE — Progress Notes (Signed)
   Subjective:    Patient ID: Shelly Jackson, female    DOB: December 07, 1934, 79 y.o.   MRN: [id number]  Diabetes She presents for her follow-up diabetic visit. She has type 2 diabetes mellitus. Her disease course has been stable. There are no hypoglycemic associated symptoms. Pertinent negatives for hypoglycemia include no confusion. There are no diabetic associated symptoms. Pertinent negatives for diabetes include no chest pain, no fatigue, no polydipsia, no polyphagia and no weakness. There are no hypoglycemic complications. Symptoms are stable. There are no diabetic complications. There are no known risk factors for coronary artery disease. Current diabetic treatment includes insulin injections and oral agent (monotherapy). She is compliant with treatment all of the time.   HgbA1C today is 7.0.  Patient has no other concerns at this time.  Long discussion held regarding diabetes risk factors for strokes and heart disease the importance of good diet the importance of keeping cholesterol blood pressure blood sugar under control. Patient's readings have been good she has not had any low spells.  Review of Systems  Constitutional: Negative for activity change, appetite change and fatigue.  HENT: Negative for congestion.   Respiratory: Negative for cough, choking and chest tightness.   Cardiovascular: Negative for chest pain.  Gastrointestinal: Negative for abdominal pain.  Endocrine: Negative for polydipsia and polyphagia.  Genitourinary: Negative for frequency.  Neurological: Negative for weakness.  Psychiatric/Behavioral: Negative for confusion.       Objective:   Physical Exam  Vitals reviewed. Constitutional: She appears well-nourished. No distress.  Cardiovascular: Normal rate, regular rhythm and normal heart sounds.   No murmur heard. Pulmonary/Chest: Effort normal and breath sounds normal. No respiratory distress.  Musculoskeletal: She exhibits no edema.  Lymphadenopathy:    She  has no cervical adenopathy.  Neurological: She is alert. She exhibits normal muscle tone.  Psychiatric: Her behavior is normal.          Assessment & Plan:  Diabetes-A1c actually looks good at 7.0 by this is slightly higher than what has been she needs better job watching her diet state what medications followup again in 3-4 months to recheck the A1c  Blood pressure actually looks very good she needs continue current measures.  Hyperlipidemia continue current medication. We'll await the results of these.  Follow through with cardiology on a regular basis

## 2014-01-08 ENCOUNTER — Encounter: Payer: Self-pay | Admitting: Family Medicine

## 2014-01-08 DIAGNOSIS — E11311 Type 2 diabetes mellitus with unspecified diabetic retinopathy with macular edema: Secondary | ICD-10-CM | POA: Diagnosis not present

## 2014-01-08 DIAGNOSIS — E1139 Type 2 diabetes mellitus with other diabetic ophthalmic complication: Secondary | ICD-10-CM | POA: Diagnosis not present

## 2014-01-21 DIAGNOSIS — H6690 Otitis media, unspecified, unspecified ear: Secondary | ICD-10-CM | POA: Diagnosis not present

## 2014-01-21 DIAGNOSIS — Z719 Counseling, unspecified: Secondary | ICD-10-CM | POA: Diagnosis not present

## 2014-01-21 DIAGNOSIS — J019 Acute sinusitis, unspecified: Secondary | ICD-10-CM | POA: Diagnosis not present

## 2014-01-21 DIAGNOSIS — Z713 Dietary counseling and surveillance: Secondary | ICD-10-CM | POA: Diagnosis not present

## 2014-01-29 DIAGNOSIS — E11311 Type 2 diabetes mellitus with unspecified diabetic retinopathy with macular edema: Secondary | ICD-10-CM | POA: Diagnosis not present

## 2014-01-29 DIAGNOSIS — E1139 Type 2 diabetes mellitus with other diabetic ophthalmic complication: Secondary | ICD-10-CM | POA: Diagnosis not present

## 2014-01-31 DIAGNOSIS — M542 Cervicalgia: Secondary | ICD-10-CM | POA: Diagnosis not present

## 2014-01-31 DIAGNOSIS — S0990XA Unspecified injury of head, initial encounter: Secondary | ICD-10-CM | POA: Diagnosis not present

## 2014-01-31 DIAGNOSIS — R51 Headache: Secondary | ICD-10-CM | POA: Diagnosis not present

## 2014-02-04 ENCOUNTER — Other Ambulatory Visit: Payer: Self-pay | Admitting: Family Medicine

## 2014-02-05 DIAGNOSIS — E785 Hyperlipidemia, unspecified: Secondary | ICD-10-CM | POA: Diagnosis not present

## 2014-02-05 DIAGNOSIS — I2510 Atherosclerotic heart disease of native coronary artery without angina pectoris: Secondary | ICD-10-CM | POA: Diagnosis not present

## 2014-02-05 DIAGNOSIS — I119 Hypertensive heart disease without heart failure: Secondary | ICD-10-CM | POA: Diagnosis not present

## 2014-02-12 DIAGNOSIS — E11359 Type 2 diabetes mellitus with proliferative diabetic retinopathy without macular edema: Secondary | ICD-10-CM | POA: Diagnosis not present

## 2014-02-12 DIAGNOSIS — E11311 Type 2 diabetes mellitus with unspecified diabetic retinopathy with macular edema: Secondary | ICD-10-CM | POA: Diagnosis not present

## 2014-02-21 DIAGNOSIS — I119 Hypertensive heart disease without heart failure: Secondary | ICD-10-CM | POA: Diagnosis not present

## 2014-02-25 ENCOUNTER — Telehealth: Payer: Self-pay | Admitting: Family Medicine

## 2014-02-25 NOTE — Telephone Encounter (Signed)
May have 90 day with 1 refill for [facility] ( Xanax)

## 2014-02-25 NOTE — Telephone Encounter (Signed)
Patient needs Rx for xanax to [facility]

## 2014-02-26 MED ORDER — ALPRAZOLAM 0.5 MG PO TABS: 0.5000 mg | ORAL_TABLET | Freq: Every evening | ORAL | Status: DC | PRN

## 2014-02-26 NOTE — Telephone Encounter (Signed)
Script faxed to pharmacy. Patient was notified.

## 2014-03-05 DIAGNOSIS — E11351 Type 2 diabetes mellitus with proliferative diabetic retinopathy with macular edema: Secondary | ICD-10-CM | POA: Diagnosis not present

## 2014-03-10 LAB — HM DIABETES EYE EXAM

## 2014-03-13 ENCOUNTER — Other Ambulatory Visit: Payer: Self-pay | Admitting: Family Medicine

## 2014-03-19 DIAGNOSIS — E11351 Type 2 diabetes mellitus with proliferative diabetic retinopathy with macular edema: Secondary | ICD-10-CM | POA: Diagnosis not present

## 2014-03-26 DIAGNOSIS — H2511 Age-related nuclear cataract, right eye: Secondary | ICD-10-CM | POA: Diagnosis not present

## 2014-03-26 DIAGNOSIS — H538 Other visual disturbances: Secondary | ICD-10-CM | POA: Diagnosis not present

## 2014-04-02 ENCOUNTER — Telehealth: Payer: Self-pay | Admitting: Family Medicine

## 2014-04-02 ENCOUNTER — Other Ambulatory Visit: Payer: Self-pay | Admitting: *Deleted

## 2014-04-02 MED ORDER — INSULIN GLARGINE 100 UNIT/ML ~~LOC~~ SOLN: 45.0000 [IU] | Freq: Every day | SUBCUTANEOUS | Status: DC

## 2014-04-02 NOTE — Telephone Encounter (Signed)
Med sent to [facility]. Pt notified.

## 2014-04-02 NOTE — Telephone Encounter (Signed)
insulin glargine (LANTUS) 100 UNIT/ML injection    Pt states the Pharmacy is telling her she has no refills,  But we sent in 90 day supply 8/31 with 2 refills   Non the less can we send in a 90 day supply on this again with  Refills?   Pt needs by 5:30 today, she does not have any for tonight

## 2014-04-18 DIAGNOSIS — E11351 Type 2 diabetes mellitus with proliferative diabetic retinopathy with macular edema: Secondary | ICD-10-CM | POA: Diagnosis not present

## 2014-04-25 ENCOUNTER — Encounter: Payer: Self-pay | Admitting: Family Medicine

## 2014-04-25 ENCOUNTER — Ambulatory Visit (INDEPENDENT_AMBULATORY_CARE_PROVIDER_SITE_OTHER): Payer: Medicare Other | Admitting: Family Medicine

## 2014-04-25 VITALS — BP 132/68 | Ht 62.0 in | Wt 210.0 lb

## 2014-04-25 DIAGNOSIS — E118 Type 2 diabetes mellitus with unspecified complications: Secondary | ICD-10-CM | POA: Diagnosis not present

## 2014-04-25 DIAGNOSIS — I10 Essential (primary) hypertension: Secondary | ICD-10-CM | POA: Diagnosis not present

## 2014-04-25 DIAGNOSIS — Z23 Encounter for immunization: Secondary | ICD-10-CM | POA: Diagnosis not present

## 2014-04-25 DIAGNOSIS — E1342 Other specified diabetes mellitus with diabetic polyneuropathy: Secondary | ICD-10-CM

## 2014-04-25 DIAGNOSIS — E782 Mixed hyperlipidemia: Secondary | ICD-10-CM

## 2014-04-25 DIAGNOSIS — E1142 Type 2 diabetes mellitus with diabetic polyneuropathy: Secondary | ICD-10-CM | POA: Insufficient documentation

## 2014-04-25 DIAGNOSIS — G629 Polyneuropathy, unspecified: Secondary | ICD-10-CM

## 2014-04-25 DIAGNOSIS — E11331 Type 2 diabetes mellitus with moderate nonproliferative diabetic retinopathy with macular edema: Secondary | ICD-10-CM | POA: Diagnosis not present

## 2014-04-25 DIAGNOSIS — E11359 Type 2 diabetes mellitus with proliferative diabetic retinopathy without macular edema: Secondary | ICD-10-CM | POA: Diagnosis not present

## 2014-04-25 LAB — POCT GLYCOSYLATED HEMOGLOBIN (HGB A1C): Hemoglobin A1C: 6.9

## 2014-04-25 MED ORDER — INSULIN GLARGINE 100 UNIT/ML ~~LOC~~ SOLN: SUBCUTANEOUS | Status: DC

## 2014-04-25 NOTE — Progress Notes (Signed)
   Subjective:    Patient ID: Shelly Jackson, female    DOB: Jul 21, 1934, 79 y.o.   MRN: [id number]  Diabetes She presents for her follow-up diabetic visit. She has type 2 diabetes mellitus. There are no hypoglycemic associated symptoms. Pertinent negatives for hypoglycemia include no confusion. There are no diabetic associated symptoms. Pertinent negatives for diabetes include no chest pain, no fatigue, no polydipsia, no polyphagia and no weakness. Symptoms are stable. Diabetic complications include retinopathy. Current diabetic treatment includes insulin injections and oral agent (monotherapy). She is compliant with treatment all of the time. She is currently taking insulin at bedtime. She participates in exercise intermittently. She monitors blood glucose at home 1-2 x per day. Blood glucose monitoring compliance is good. Her highest blood glucose is 140-180 mg/dl. Her overall blood glucose range is 130-140 mg/dl. She sees a podiatrist.Eye exam is current (has to go every 2 weeks to get injections in her eyes).    Wants flu vaccine   Review of Systems  Constitutional: Negative for activity change, appetite change and fatigue.  HENT: Negative for congestion.   Respiratory: Negative for cough and choking.   Cardiovascular: Negative for chest pain.  Gastrointestinal: Negative for abdominal pain.  Endocrine: Negative for polydipsia and polyphagia.  Genitourinary: Negative for frequency.  Neurological: Negative for weakness.  Psychiatric/Behavioral: Negative for confusion.       Objective:   Physical Exam  Constitutional: She appears well-nourished. No distress.  Cardiovascular: Normal rate, regular rhythm and normal heart sounds.   No murmur heard. Pulmonary/Chest: Effort normal and breath sounds normal. No respiratory distress.  Musculoskeletal: She exhibits no edema.  Lymphadenopathy:    She has no cervical adenopathy.  Neurological: She is alert. She exhibits normal muscle tone.    Psychiatric: Her behavior is normal.  Vitals reviewed.         Assessment & Plan:  #1 diabetes actually under better control the what it has been I would continue the current measures. Watch diet continue medication  HTN blood pressure is stable continue current measures avoid excessive salt  Obesity patient was encouraged to try to lose weight through watching her portion staying physically active Hyperlipidemia no need to repeat lab work currently we will do so in a few months time  Diabetic peripheral neuropathy in the feet along with bunions she is wearing proper shoes or is no ulcers she was counseled regarding proper foot care  Diabetic screening for diabetic nephropathy awaiting the results

## 2014-04-26 ENCOUNTER — Other Ambulatory Visit: Payer: Self-pay | Admitting: *Deleted

## 2014-04-26 ENCOUNTER — Encounter: Payer: Self-pay | Admitting: Family Medicine

## 2014-04-26 LAB — MICROALBUMIN, URINE: Microalb, Ur: 7.3 mg/dL — ABNORMAL HIGH (ref <2.0)

## 2014-05-21 ENCOUNTER — Telehealth: Payer: Self-pay | Admitting: Family Medicine

## 2014-05-21 MED ORDER — INSULIN SYRINGES (DISPOSABLE) U-100 1 ML MISC
Status: DC
Start: 2014-05-21 — End: 2015-04-29

## 2014-05-21 NOTE — Telephone Encounter (Signed)
Rx sent electronically to pharmacy. Patient notified. 

## 2014-05-21 NOTE — Telephone Encounter (Signed)
Pt is needing a refill of insulin syringes sent to [facility] in  [place] today she is completely out.

## 2014-05-30 DIAGNOSIS — E11351 Type 2 diabetes mellitus with proliferative diabetic retinopathy with macular edema: Secondary | ICD-10-CM | POA: Diagnosis not present

## 2014-06-06 DIAGNOSIS — E11351 Type 2 diabetes mellitus with proliferative diabetic retinopathy with macular edema: Secondary | ICD-10-CM | POA: Diagnosis not present

## 2014-06-18 LAB — HM DIABETES EYE EXAM

## 2014-06-19 DIAGNOSIS — H2511 Age-related nuclear cataract, right eye: Secondary | ICD-10-CM | POA: Diagnosis not present

## 2014-06-19 DIAGNOSIS — H538 Other visual disturbances: Secondary | ICD-10-CM | POA: Diagnosis not present

## 2014-07-01 DIAGNOSIS — H538 Other visual disturbances: Secondary | ICD-10-CM | POA: Diagnosis not present

## 2014-07-01 DIAGNOSIS — M1990 Unspecified osteoarthritis, unspecified site: Secondary | ICD-10-CM | POA: Diagnosis not present

## 2014-07-01 DIAGNOSIS — Z7982 Long term (current) use of aspirin: Secondary | ICD-10-CM | POA: Diagnosis not present

## 2014-07-01 DIAGNOSIS — Z79899 Other long term (current) drug therapy: Secondary | ICD-10-CM | POA: Diagnosis not present

## 2014-07-01 DIAGNOSIS — E11311 Type 2 diabetes mellitus with unspecified diabetic retinopathy with macular edema: Secondary | ICD-10-CM | POA: Diagnosis not present

## 2014-07-01 DIAGNOSIS — E669 Obesity, unspecified: Secondary | ICD-10-CM | POA: Diagnosis not present

## 2014-07-01 DIAGNOSIS — H4312 Vitreous hemorrhage, left eye: Secondary | ICD-10-CM | POA: Diagnosis not present

## 2014-07-01 DIAGNOSIS — Z6838 Body mass index (BMI) 38.0-38.9, adult: Secondary | ICD-10-CM | POA: Diagnosis not present

## 2014-07-01 DIAGNOSIS — H2511 Age-related nuclear cataract, right eye: Secondary | ICD-10-CM | POA: Diagnosis not present

## 2014-07-01 DIAGNOSIS — F419 Anxiety disorder, unspecified: Secondary | ICD-10-CM | POA: Diagnosis not present

## 2014-07-01 DIAGNOSIS — H5210 Myopia, unspecified eye: Secondary | ICD-10-CM | POA: Diagnosis not present

## 2014-07-01 DIAGNOSIS — Z961 Presence of intraocular lens: Secondary | ICD-10-CM | POA: Diagnosis not present

## 2014-07-01 DIAGNOSIS — I10 Essential (primary) hypertension: Secondary | ICD-10-CM | POA: Diagnosis not present

## 2014-07-01 DIAGNOSIS — H524 Presbyopia: Secondary | ICD-10-CM | POA: Diagnosis not present

## 2014-07-01 DIAGNOSIS — H52209 Unspecified astigmatism, unspecified eye: Secondary | ICD-10-CM | POA: Diagnosis not present

## 2014-07-01 DIAGNOSIS — I509 Heart failure, unspecified: Secondary | ICD-10-CM | POA: Diagnosis not present

## 2014-07-01 DIAGNOSIS — R12 Heartburn: Secondary | ICD-10-CM | POA: Diagnosis not present

## 2014-07-01 DIAGNOSIS — H269 Unspecified cataract: Secondary | ICD-10-CM | POA: Diagnosis not present

## 2014-07-01 DIAGNOSIS — G4730 Sleep apnea, unspecified: Secondary | ICD-10-CM | POA: Diagnosis not present

## 2014-07-01 DIAGNOSIS — H59039 Cystoid macular edema following cataract surgery, unspecified eye: Secondary | ICD-10-CM | POA: Diagnosis not present

## 2014-07-01 DIAGNOSIS — F329 Major depressive disorder, single episode, unspecified: Secondary | ICD-10-CM | POA: Diagnosis not present

## 2014-07-09 LAB — HM DIABETES EYE EXAM

## 2014-08-01 ENCOUNTER — Other Ambulatory Visit: Payer: Self-pay | Admitting: *Deleted

## 2014-08-01 ENCOUNTER — Telehealth: Payer: Self-pay | Admitting: Family Medicine

## 2014-08-01 MED ORDER — PRAVASTATIN SODIUM 80 MG PO TABS: 80.0000 mg | ORAL_TABLET | Freq: Every day | ORAL | Status: DC

## 2014-08-01 NOTE — Telephone Encounter (Signed)
Patient requesting Rx for pravastatin 80 mg tab, 1 tablet at bedtime.  Shelly Jackson  She sent in a paper requesting this.  Please see in red folder.

## 2014-08-01 NOTE — Telephone Encounter (Signed)
Med sent to pharm. Pt notified.  

## 2014-08-13 DIAGNOSIS — E11351 Type 2 diabetes mellitus with proliferative diabetic retinopathy with macular edema: Secondary | ICD-10-CM | POA: Diagnosis not present

## 2014-08-13 DIAGNOSIS — H359 Unspecified retinal disorder: Secondary | ICD-10-CM | POA: Diagnosis not present

## 2014-08-19 ENCOUNTER — Other Ambulatory Visit: Payer: Self-pay | Admitting: Family Medicine

## 2014-08-20 DIAGNOSIS — E11351 Type 2 diabetes mellitus with proliferative diabetic retinopathy with macular edema: Secondary | ICD-10-CM | POA: Diagnosis not present

## 2014-08-21 NOTE — Telephone Encounter (Signed)
May have this plus one additional refill send a card to the patient she needs a follow-up office visit

## 2014-08-23 ENCOUNTER — Other Ambulatory Visit: Payer: Self-pay | Admitting: Family Medicine

## 2014-08-23 NOTE — Telephone Encounter (Signed)
Ok plus one ref 

## 2014-08-28 ENCOUNTER — Ambulatory Visit (INDEPENDENT_AMBULATORY_CARE_PROVIDER_SITE_OTHER): Payer: Medicare Other | Admitting: Family Medicine

## 2014-08-28 ENCOUNTER — Encounter: Payer: Self-pay | Admitting: Family Medicine

## 2014-08-28 VITALS — BP 132/74 | Ht 62.0 in | Wt 211.6 lb

## 2014-08-28 DIAGNOSIS — I10 Essential (primary) hypertension: Secondary | ICD-10-CM | POA: Diagnosis not present

## 2014-08-28 DIAGNOSIS — E782 Mixed hyperlipidemia: Secondary | ICD-10-CM

## 2014-08-28 DIAGNOSIS — E119 Type 2 diabetes mellitus without complications: Secondary | ICD-10-CM

## 2014-08-28 LAB — POCT GLYCOSYLATED HEMOGLOBIN (HGB A1C): HEMOGLOBIN A1C: 7.4

## 2014-08-28 NOTE — Progress Notes (Signed)
   Subjective:    Patient ID: Shelly Jackson, female    DOB: Apr 27, 1935, 80 y.o.   MRN: [id number]  Diabetes She presents for her follow-up diabetic visit. She has type 2 diabetes mellitus. Pertinent negatives for hypoglycemia include no confusion. Pertinent negatives for diabetes include no chest pain, no fatigue, no polydipsia, no polyphagia and no weakness. Her home blood glucose trend is fluctuating minimally. She does not see a podiatrist.Eye exam is current.  Pt brought in blood sugar readings.  Pt states no concerns today.  Long discussion held today regarding patient's diabetes, compliance with medication, she is not having any low sugar spells, she is having bilateral foot trouble for which she feels she needs diabetic shoes. Patient states her moods are doing good she relates she is taking her blood pressure medicine not having any problems there. She does have some dryness in the mouth probably related to her bladder medicine. She denies black stool, she does try to minimize fats in diet and takes her cholesterol medicine she will need comprehensive lab work before the next visit Review of Systems  Constitutional: Negative for activity change, appetite change and fatigue.  HENT: Negative for congestion.   Respiratory: Negative for cough.   Cardiovascular: Negative for chest pain.  Gastrointestinal: Negative for abdominal pain.  Endocrine: Negative for polydipsia and polyphagia.  Neurological: Negative for weakness.  Psychiatric/Behavioral: Negative for confusion.       Objective:   Physical Exam  Constitutional: She appears well-nourished. No distress.  Cardiovascular: Normal rate, regular rhythm and normal heart sounds.   No murmur heard. Pulmonary/Chest: Effort normal and breath sounds normal. No respiratory distress.  Musculoskeletal: She exhibits no edema.  Lymphadenopathy:    She has no cervical adenopathy.  Neurological: She is alert. She exhibits normal muscle tone.    Psychiatric: Her behavior is normal.  Vitals reviewed.         Assessment & Plan:  1. Type 2 diabetes mellitus without complication Overall her diabetes control is considered adequate she was encouraged to do a better job watching diet become more active as the weather is better hopefully we can get the A1c closer to 7 - POCT glycosylated hemoglobin (Hb O2V) - Basic metabolic panel - Hemoglobin A1c  2. HTN (hypertension), benign Blood pressure acceptable continue current measures check lab work before next visit - Basic metabolic panel  3. Hyperlipemia, mixed Watch diet closely continue cholesterol medicine check lab work next visit does not need lab work currently - Hepatic function panel - Lipid panel  Patient does have diabetic changes in the feet along with bunions. Pre-ulcerative calluses she should qualify for diabetic shoes  She does have sleep apnea she needs new equipment she does not know where to go to get that. We will try to get one of our staff members to help out with this.  Sleep apnea- needs equipment   Diabetic foot changes- needs diabetic shoes

## 2014-08-29 ENCOUNTER — Other Ambulatory Visit: Payer: Self-pay | Admitting: Family Medicine

## 2014-09-17 ENCOUNTER — Telehealth: Payer: Self-pay | Admitting: Family Medicine

## 2014-09-17 ENCOUNTER — Other Ambulatory Visit: Payer: Self-pay | Admitting: Family Medicine

## 2014-09-17 MED ORDER — METFORMIN HCL 1000 MG PO TABS: ORAL_TABLET | ORAL | Status: DC

## 2014-09-17 NOTE — Telephone Encounter (Signed)
Rx sent electronically to pharmacy. Patient notified. 

## 2014-09-17 NOTE — Telephone Encounter (Signed)
Pt is requesting a one month refill of metformin to be sent over to [facility].

## 2014-10-08 DIAGNOSIS — H3531 Nonexudative age-related macular degeneration: Secondary | ICD-10-CM | POA: Diagnosis not present

## 2014-10-08 DIAGNOSIS — E11359 Type 2 diabetes mellitus with proliferative diabetic retinopathy without macular edema: Secondary | ICD-10-CM | POA: Diagnosis not present

## 2014-10-08 DIAGNOSIS — E11351 Type 2 diabetes mellitus with proliferative diabetic retinopathy with macular edema: Secondary | ICD-10-CM | POA: Diagnosis not present

## 2014-10-15 ENCOUNTER — Other Ambulatory Visit: Payer: Self-pay | Admitting: Family Medicine

## 2014-10-28 ENCOUNTER — Other Ambulatory Visit: Payer: Self-pay | Admitting: Family Medicine

## 2014-11-12 ENCOUNTER — Telehealth: Payer: Self-pay | Admitting: Family Medicine

## 2014-11-12 MED ORDER — INSULIN GLARGINE 100 UNIT/ML SOLOSTAR PEN: 45.0000 [IU] | PEN_INJECTOR | Freq: Every day | SUBCUTANEOUS | Status: DC

## 2014-11-12 NOTE — Telephone Encounter (Signed)
Rx sent electronically to pharmacy. Patient notified and is currently taking 45 units.

## 2014-11-12 NOTE — Telephone Encounter (Signed)
Patient says that she was told by Dr. Nicki Reaper to find out if it would be more cost efficient for her to change her diabetic supplies to something different.  She says that she has looked into this and the lantus solostar would be cost efficient for her.  She would like this renewed and sent in to [facility].

## 2014-11-12 NOTE — Telephone Encounter (Signed)
Please confirm with the patient the dosage she is using, order it appropriately, send to [facility].

## 2014-11-19 DIAGNOSIS — E11351 Type 2 diabetes mellitus with proliferative diabetic retinopathy with macular edema: Secondary | ICD-10-CM | POA: Diagnosis not present

## 2014-11-20 DIAGNOSIS — S86019A Strain of unspecified Achilles tendon, initial encounter: Secondary | ICD-10-CM | POA: Diagnosis not present

## 2014-11-20 DIAGNOSIS — S99912A Unspecified injury of left ankle, initial encounter: Secondary | ICD-10-CM | POA: Diagnosis not present

## 2014-11-20 DIAGNOSIS — R262 Difficulty in walking, not elsewhere classified: Secondary | ICD-10-CM | POA: Diagnosis not present

## 2014-12-09 ENCOUNTER — Telehealth: Payer: Self-pay | Admitting: Family Medicine

## 2014-12-09 NOTE — Telephone Encounter (Signed)
Error

## 2014-12-13 ENCOUNTER — Other Ambulatory Visit: Payer: Self-pay | Admitting: *Deleted

## 2014-12-13 MED ORDER — INSULIN GLARGINE 100 UNIT/ML SOLOSTAR PEN: 45.0000 [IU] | PEN_INJECTOR | Freq: Every day | SUBCUTANEOUS | Status: DC

## 2014-12-19 ENCOUNTER — Other Ambulatory Visit: Payer: Self-pay | Admitting: Family Medicine

## 2014-12-19 DIAGNOSIS — I10 Essential (primary) hypertension: Secondary | ICD-10-CM | POA: Diagnosis not present

## 2014-12-19 DIAGNOSIS — E782 Mixed hyperlipidemia: Secondary | ICD-10-CM | POA: Diagnosis not present

## 2014-12-19 DIAGNOSIS — E119 Type 2 diabetes mellitus without complications: Secondary | ICD-10-CM | POA: Diagnosis not present

## 2014-12-19 LAB — HEPATIC FUNCTION PANEL
ALK PHOS: 48 U/L (ref 33–130)
ALT: 18 U/L (ref 6–29)
AST: 18 U/L (ref 10–35)
Albumin: 3.8 g/dL (ref 3.6–5.1)
BILIRUBIN DIRECT: 0.1 mg/dL (ref <=0.2)
BILIRUBIN INDIRECT: 0.5 mg/dL (ref 0.2–1.2)
Total Bilirubin: 0.6 mg/dL (ref 0.2–1.2)
Total Protein: 6.2 g/dL (ref 6.1–8.1)

## 2014-12-19 LAB — BASIC METABOLIC PANEL
BUN: 20 mg/dL (ref 7–25)
CALCIUM: 8.7 mg/dL (ref 8.6–10.4)
CO2: 24 mmol/L (ref 20–31)
CREATININE: 0.72 mg/dL (ref 0.60–0.93)
Chloride: 106 mmol/L (ref 98–110)
GLUCOSE: 126 mg/dL — AB (ref 65–99)
Potassium: 4.1 mmol/L (ref 3.5–5.3)
Sodium: 141 mmol/L (ref 135–146)

## 2014-12-19 LAB — LIPID PANEL
Cholesterol: 137 mg/dL (ref 125–200)
HDL: 39 mg/dL — AB (ref >=46)
LDL CALC: 69 mg/dL (ref <130)
Total CHOL/HDL Ratio: 3.5 Ratio (ref <=5.0)
Triglycerides: 146 mg/dL (ref <150)
VLDL: 29 mg/dL (ref <30)

## 2014-12-19 LAB — HEMOGLOBIN A1C
HEMOGLOBIN A1C: 7.6 % — AB (ref <5.7)
MEAN PLASMA GLUCOSE: 171 mg/dL — AB (ref <117)

## 2014-12-24 ENCOUNTER — Encounter: Payer: Self-pay | Admitting: Family Medicine

## 2014-12-24 ENCOUNTER — Ambulatory Visit (INDEPENDENT_AMBULATORY_CARE_PROVIDER_SITE_OTHER): Payer: Medicare Other | Admitting: Family Medicine

## 2014-12-24 VITALS — BP 116/74 | Ht 62.0 in | Wt 212.0 lb

## 2014-12-24 DIAGNOSIS — E118 Type 2 diabetes mellitus with unspecified complications: Secondary | ICD-10-CM

## 2014-12-24 DIAGNOSIS — E11351 Type 2 diabetes mellitus with proliferative diabetic retinopathy with macular edema: Secondary | ICD-10-CM | POA: Diagnosis not present

## 2014-12-24 DIAGNOSIS — E13339 Other specified diabetes mellitus with moderate nonproliferative diabetic retinopathy without macular edema: Secondary | ICD-10-CM

## 2014-12-24 DIAGNOSIS — E1342 Other specified diabetes mellitus with diabetic polyneuropathy: Secondary | ICD-10-CM | POA: Diagnosis not present

## 2014-12-24 DIAGNOSIS — E11319 Type 2 diabetes mellitus with unspecified diabetic retinopathy without macular edema: Secondary | ICD-10-CM | POA: Insufficient documentation

## 2014-12-24 DIAGNOSIS — I10 Essential (primary) hypertension: Secondary | ICD-10-CM

## 2014-12-24 DIAGNOSIS — E1142 Type 2 diabetes mellitus with diabetic polyneuropathy: Secondary | ICD-10-CM

## 2014-12-24 DIAGNOSIS — E669 Obesity, unspecified: Secondary | ICD-10-CM

## 2014-12-24 DIAGNOSIS — M8580 Other specified disorders of bone density and structure, unspecified site: Secondary | ICD-10-CM | POA: Diagnosis not present

## 2014-12-24 DIAGNOSIS — E133399 Other specified diabetes mellitus with moderate nonproliferative diabetic retinopathy without macular edema, unspecified eye: Secondary | ICD-10-CM

## 2014-12-24 DIAGNOSIS — G629 Polyneuropathy, unspecified: Secondary | ICD-10-CM

## 2014-12-24 MED ORDER — INSULIN GLARGINE 100 UNIT/ML SOLOSTAR PEN: 48.0000 [IU] | PEN_INJECTOR | Freq: Every day | SUBCUTANEOUS | Status: DC

## 2014-12-24 NOTE — Progress Notes (Signed)
   Subjective:    Patient ID: Shelly Jackson, female    DOB: [birth year], 80 y.o.   MRN: [id number]  Diabetes She presents for her follow-up diabetic visit. She has type 2 diabetes mellitus. Pertinent negatives for hypoglycemia include no confusion. Pertinent negatives for diabetes include no chest pain, no fatigue, no polydipsia, no polyphagia and no weakness. Current diabetic treatment includes insulin injections. She is compliant with treatment all of the time. She is following a diabetic diet. She never participates in exercise. She does not see a podiatrist.Eye exam is current.  A1C done on bloodwork. 7.6   multiple issues were discussed including her blood pressure her feet contusion on the right foot contusion on the left foot diabetes cholesterol in the importance of taking her medicine greater 25 minutes spent with patient Having pain left heel. Stepped on rock about 4 weeks ago. Went to [facility] in [place] and had xray. Pt states xray was normal. Pt is elevating foot, using witch hazel, ice, taking aleve.   Having pain in right toes. Pt hit foot on tree stump.   Dr Zadie Rhine will be doing eye injection Review of Systems  Constitutional: Negative for activity change, appetite change and fatigue.  HENT: Negative for congestion.   Respiratory: Negative for cough.   Cardiovascular: Negative for chest pain.  Gastrointestinal: Negative for abdominal pain.  Endocrine: Negative for polydipsia and polyphagia.  Neurological: Negative for weakness.  Psychiatric/Behavioral: Negative for confusion.       Objective:   Physical Exam  Constitutional: She appears well-nourished. No distress.  Cardiovascular: Normal rate, regular rhythm and normal heart sounds.   No murmur heard. Pulmonary/Chest: Effort normal and breath sounds normal. No respiratory distress.  Musculoskeletal: She exhibits no edema.  Lymphadenopathy:    She has no cervical adenopathy.  Neurological: She is alert. She  exhibits normal muscle tone.  Psychiatric: Her behavior is normal.  Vitals reviewed.         Assessment & Plan:  1. HTN (hypertension), benign  blood pressure under good control continue current measures. Sees cardiology every 6 months  2. Type 2 diabetes mellitus with complication  diabetes fair control could be better. I recommend the importance of getting sugars under better control.  3. Diabetic peripheral neuropathy  persistent diabetic neuropathy along with bunions and pre-ulcerative calluses. Benefits from diabetic shoes.  4. Obesity  obesity difficult for patient to lose weight because of her age  38. 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 Osteopenia  bone density recommended.   follow-up in a proximally 4 months   left heel pain and discomfort related to stepping on a rock should gradually get better I showed her how to stretch the heel tendon   patient with contusion in the right foot third and fourth toe blue typical findings of a contusion could be a fracture patient defers on x-ray

## 2015-01-06 ENCOUNTER — Other Ambulatory Visit: Payer: Self-pay | Admitting: *Deleted

## 2015-01-06 MED ORDER — LOSARTAN POTASSIUM-HCTZ 50-12.5 MG PO TABS: ORAL_TABLET | ORAL | Status: DC

## 2015-01-09 ENCOUNTER — Other Ambulatory Visit: Payer: Self-pay | Admitting: *Deleted

## 2015-01-09 MED ORDER — CITALOPRAM HYDROBROMIDE 20 MG PO TABS: 20.0000 mg | ORAL_TABLET | Freq: Every day | ORAL | Status: DC

## 2015-02-04 ENCOUNTER — Other Ambulatory Visit: Payer: Self-pay | Admitting: Family Medicine

## 2015-02-04 DIAGNOSIS — E11351 Type 2 diabetes mellitus with proliferative diabetic retinopathy with macular edema: Secondary | ICD-10-CM | POA: Diagnosis not present

## 2015-02-06 ENCOUNTER — Other Ambulatory Visit: Payer: Self-pay | Admitting: Family Medicine

## 2015-02-06 DIAGNOSIS — Z1231 Encounter for screening mammogram for malignant neoplasm of breast: Secondary | ICD-10-CM

## 2015-02-07 DIAGNOSIS — I350 Nonrheumatic aortic (valve) stenosis: Secondary | ICD-10-CM | POA: Diagnosis not present

## 2015-02-07 DIAGNOSIS — I10 Essential (primary) hypertension: Secondary | ICD-10-CM | POA: Diagnosis not present

## 2015-02-07 DIAGNOSIS — E785 Hyperlipidemia, unspecified: Secondary | ICD-10-CM | POA: Diagnosis not present

## 2015-02-10 ENCOUNTER — Ambulatory Visit (HOSPITAL_COMMUNITY): Payer: Medicare Other

## 2015-02-12 ENCOUNTER — Other Ambulatory Visit: Payer: Self-pay | Admitting: Family Medicine

## 2015-02-12 NOTE — Telephone Encounter (Signed)
May have this and one refill

## 2015-02-13 ENCOUNTER — Ambulatory Visit (HOSPITAL_COMMUNITY)
Admission: RE | Admit: 2015-02-13 | Discharge: 2015-02-13 | Disposition: A | Discharge status: Home / Self Care | Payer: Medicare Other | Source: Ambulatory Visit | Attending: Family Medicine | Admitting: Family Medicine

## 2015-02-13 DIAGNOSIS — Z1231 Encounter for screening mammogram for malignant neoplasm of breast: Secondary | ICD-10-CM | POA: Insufficient documentation

## 2015-02-27 ENCOUNTER — Other Ambulatory Visit: Payer: Self-pay | Admitting: Family Medicine

## 2015-02-28 DIAGNOSIS — I10 Essential (primary) hypertension: Secondary | ICD-10-CM | POA: Diagnosis not present

## 2015-03-18 DIAGNOSIS — E113512 Type 2 diabetes mellitus with proliferative diabetic retinopathy with macular edema, left eye: Secondary | ICD-10-CM | POA: Diagnosis not present

## 2015-03-31 DIAGNOSIS — I359 Nonrheumatic aortic valve disorder, unspecified: Secondary | ICD-10-CM | POA: Diagnosis not present

## 2015-03-31 DIAGNOSIS — I10 Essential (primary) hypertension: Secondary | ICD-10-CM | POA: Diagnosis not present

## 2015-04-22 DIAGNOSIS — E113512 Type 2 diabetes mellitus with proliferative diabetic retinopathy with macular edema, left eye: Secondary | ICD-10-CM | POA: Diagnosis not present

## 2015-04-23 ENCOUNTER — Ambulatory Visit (INDEPENDENT_AMBULATORY_CARE_PROVIDER_SITE_OTHER): Payer: Medicare Other | Admitting: Family Medicine

## 2015-04-23 ENCOUNTER — Encounter: Payer: Self-pay | Admitting: Family Medicine

## 2015-04-23 VITALS — BP 120/78 | Ht 62.0 in | Wt 210.0 lb

## 2015-04-23 DIAGNOSIS — E782 Mixed hyperlipidemia: Secondary | ICD-10-CM

## 2015-04-23 DIAGNOSIS — Z794 Long term (current) use of insulin: Secondary | ICD-10-CM

## 2015-04-23 DIAGNOSIS — I10 Essential (primary) hypertension: Secondary | ICD-10-CM

## 2015-04-23 DIAGNOSIS — Z23 Encounter for immunization: Secondary | ICD-10-CM | POA: Diagnosis not present

## 2015-04-23 DIAGNOSIS — E119 Type 2 diabetes mellitus without complications: Secondary | ICD-10-CM

## 2015-04-23 DIAGNOSIS — E669 Obesity, unspecified: Secondary | ICD-10-CM | POA: Diagnosis not present

## 2015-04-23 LAB — POCT GLYCOSYLATED HEMOGLOBIN (HGB A1C): HEMOGLOBIN A1C: 6.2

## 2015-04-23 MED ORDER — NIFEDIPINE ER OSMOTIC RELEASE 60 MG PO TB24: 60.0000 mg | ORAL_TABLET | Freq: Every day | ORAL | Status: DC

## 2015-04-23 NOTE — Progress Notes (Signed)
   Subjective:    Patient ID: Shelly Jackson, female    DOB: 1935/04/05, 80 y.o.   MRN: [id number]  Diabetes She presents for her follow-up diabetic visit. She has type 2 diabetes mellitus. There are no hypoglycemic associated symptoms. There are no diabetic associated symptoms. There are no hypoglycemic complications. There are no diabetic complications. There are no known risk factors for coronary artery disease. Current diabetic treatment includes insulin injections. She is compliant with treatment all of the time.   Patient states that she is having pain all over her body. This has been present for a long time.   diabetic eye exam- 07/09/14  25 minutes was spent with the patient. Greater than half the time was spent in discussion and answering questions and counseling regarding the issues that the patient came in for today.  Review of Systems  denies excessive thirst urination denies chest tightness pressure pain denies PND or orthopnea.    Objective:   Physical Exam   lungs clear heart regular extremities no edema except for some in the lower legs nothing severe foot exam severe changes noted with mild neuropathy pulses normal   patient has lost 2 pounds watch diet try to bring weight down    Assessment & Plan:   diabetes actually very good control continue current measures avoid low sugars  hyperlipidemia patient's cardiologist took her off of the statin we will monitor this situation   mild orthostasis when I check blood pressure sitting and standing there is a significant drop I recommend reducing her medication nifedipine reduce it to 60 mg daily    patient to follow-up in approximately 3-4 months

## 2015-04-28 ENCOUNTER — Telehealth: Payer: Self-pay | Admitting: Family Medicine

## 2015-04-28 NOTE — Telephone Encounter (Signed)
Nurse's-Somewhat confusing message from this patient. Please see female in order form. Try as best as possible to clarify with patient how she wants her prescriptions completed. I suppose refills could be sent to [facility] on January 3 somehow noting in the prescriptions that [facility] [place]. It may be difficult to maintain these wishes ongoing since it is difficult to program the computer in such a way. Certainly it would be beneficial to remind the patient to remind [place] where she once various prescription sent as well. See what you can do thank you

## 2015-04-29 MED ORDER — NIFEDIPINE ER OSMOTIC RELEASE 60 MG PO TB24: 60.0000 mg | ORAL_TABLET | Freq: Every day | ORAL | Status: DC

## 2015-04-29 MED ORDER — INSULIN GLARGINE 100 UNIT/ML SOLOSTAR PEN: 48.0000 [IU] | PEN_INJECTOR | Freq: Every day | SUBCUTANEOUS | Status: DC

## 2015-04-29 MED ORDER — CITALOPRAM HYDROBROMIDE 20 MG PO TABS: 20.0000 mg | ORAL_TABLET | Freq: Every day | ORAL | Status: DC

## 2015-04-29 MED ORDER — LOSARTAN POTASSIUM-HCTZ 50-12.5 MG PO TABS: ORAL_TABLET | ORAL | Status: DC

## 2015-04-29 MED ORDER — ALPRAZOLAM 0.5 MG PO TABS: ORAL_TABLET | ORAL | Status: DC

## 2015-04-29 MED ORDER — INSULIN SYRINGES (DISPOSABLE) U-100 1 ML MISC: Status: DC

## 2015-04-29 MED ORDER — METFORMIN HCL 1000 MG PO TABS: ORAL_TABLET | ORAL | Status: DC

## 2015-04-29 NOTE — Addendum Note (Signed)
Addended by: Dairl Ponder on: 04/29/2015 08:57 AM   Modules accepted: Orders

## 2015-04-29 NOTE — Telephone Encounter (Signed)
Discussed with patient. Patient wants all her scripts sent to [facility] except her lantus(she still wants that at [facility]) Lantus was sent electronically to [facility]. All other rxs were sent to [facility] with note to fill after May 11, 2015.

## 2015-04-30 DIAGNOSIS — R51 Headache: Secondary | ICD-10-CM | POA: Diagnosis not present

## 2015-04-30 DIAGNOSIS — Z719 Counseling, unspecified: Secondary | ICD-10-CM | POA: Diagnosis not present

## 2015-05-20 DIAGNOSIS — E113512 Type 2 diabetes mellitus with proliferative diabetic retinopathy with macular edema, left eye: Secondary | ICD-10-CM | POA: Diagnosis not present

## 2015-05-20 DIAGNOSIS — E113511 Type 2 diabetes mellitus with proliferative diabetic retinopathy with macular edema, right eye: Secondary | ICD-10-CM | POA: Diagnosis not present

## 2015-06-02 DIAGNOSIS — E113512 Type 2 diabetes mellitus with proliferative diabetic retinopathy with macular edema, left eye: Secondary | ICD-10-CM | POA: Diagnosis not present

## 2015-06-02 DIAGNOSIS — E113511 Type 2 diabetes mellitus with proliferative diabetic retinopathy with macular edema, right eye: Secondary | ICD-10-CM | POA: Diagnosis not present

## 2015-06-25 DIAGNOSIS — J111 Influenza due to unidentified influenza virus with other respiratory manifestations: Secondary | ICD-10-CM | POA: Diagnosis not present

## 2015-07-07 DIAGNOSIS — E113512 Type 2 diabetes mellitus with proliferative diabetic retinopathy with macular edema, left eye: Secondary | ICD-10-CM | POA: Diagnosis not present

## 2015-07-07 DIAGNOSIS — E113511 Type 2 diabetes mellitus with proliferative diabetic retinopathy with macular edema, right eye: Secondary | ICD-10-CM | POA: Diagnosis not present

## 2015-07-15 DIAGNOSIS — J3489 Other specified disorders of nose and nasal sinuses: Secondary | ICD-10-CM | POA: Diagnosis not present

## 2015-07-15 DIAGNOSIS — J329 Chronic sinusitis, unspecified: Secondary | ICD-10-CM | POA: Diagnosis not present

## 2015-07-23 ENCOUNTER — Telehealth: Payer: Self-pay | Admitting: Family Medicine

## 2015-07-23 MED ORDER — CITALOPRAM HYDROBROMIDE 20 MG PO TABS: 20.0000 mg | ORAL_TABLET | Freq: Every day | ORAL | Status: DC

## 2015-07-23 NOTE — Telephone Encounter (Signed)
Patient needs Rx for citalopram (CELEXA) 20 MG tablet to [facility]

## 2015-07-23 NOTE — Telephone Encounter (Signed)
Rx sent electronically to pharmacy. Patient notified. 

## 2015-07-25 ENCOUNTER — Telehealth: Payer: Self-pay | Admitting: Family Medicine

## 2015-07-25 MED ORDER — NIFEDIPINE ER OSMOTIC RELEASE 60 MG PO TB24: 60.0000 mg | ORAL_TABLET | Freq: Every day | ORAL | Status: DC

## 2015-07-25 NOTE — Telephone Encounter (Signed)
Rx sent electronically to pharmacy. Patient notified. 

## 2015-07-25 NOTE — Telephone Encounter (Signed)
Pt is needing a refill on her NIFEdipine (PROCARDIA XL/ADALAT-CC) 60 MG 24 hr tablet      [facility] - [place], [place] - [address]

## 2015-08-09 LAB — HM DIABETES EYE EXAM

## 2015-08-16 ENCOUNTER — Other Ambulatory Visit: Payer: Self-pay | Admitting: Family Medicine

## 2015-08-21 DIAGNOSIS — E113512 Type 2 diabetes mellitus with proliferative diabetic retinopathy with macular edema, left eye: Secondary | ICD-10-CM | POA: Diagnosis not present

## 2015-08-25 ENCOUNTER — Encounter: Payer: Self-pay | Admitting: Family Medicine

## 2015-08-25 ENCOUNTER — Ambulatory Visit (INDEPENDENT_AMBULATORY_CARE_PROVIDER_SITE_OTHER): Payer: Medicare Other | Admitting: Family Medicine

## 2015-08-25 VITALS — BP 132/82 | Ht 62.0 in | Wt 205.4 lb

## 2015-08-25 DIAGNOSIS — I10 Essential (primary) hypertension: Secondary | ICD-10-CM

## 2015-08-25 DIAGNOSIS — E119 Type 2 diabetes mellitus without complications: Secondary | ICD-10-CM

## 2015-08-25 DIAGNOSIS — Z794 Long term (current) use of insulin: Secondary | ICD-10-CM

## 2015-08-25 DIAGNOSIS — E133399 Other specified diabetes mellitus with moderate nonproliferative diabetic retinopathy without macular edema, unspecified eye: Secondary | ICD-10-CM | POA: Diagnosis not present

## 2015-08-25 DIAGNOSIS — E1142 Type 2 diabetes mellitus with diabetic polyneuropathy: Secondary | ICD-10-CM | POA: Diagnosis not present

## 2015-08-25 DIAGNOSIS — E782 Mixed hyperlipidemia: Secondary | ICD-10-CM | POA: Diagnosis not present

## 2015-08-25 LAB — POCT GLYCOSYLATED HEMOGLOBIN (HGB A1C): HEMOGLOBIN A1C: 6.4

## 2015-08-25 MED ORDER — INSULIN GLARGINE 100 UNIT/ML SOLOSTAR PEN: PEN_INJECTOR | SUBCUTANEOUS | Status: DC

## 2015-08-25 MED ORDER — INSULIN SYRINGES (DISPOSABLE) U-100 1 ML MISC: Status: DC

## 2015-08-25 MED ORDER — NIFEDIPINE ER OSMOTIC RELEASE 60 MG PO TB24: 60.0000 mg | ORAL_TABLET | Freq: Every day | ORAL | Status: DC

## 2015-08-25 NOTE — Progress Notes (Signed)
   Subjective:    Patient ID: Shelly Jackson, female    DOB: Apr 16, 1935, 81 y.o.   MRN: [id number]  Diabetes She presents for her follow-up diabetic visit. She has type 2 diabetes mellitus. Pertinent negatives for hypoglycemia include no confusion. Pertinent negatives for diabetes include no chest pain, no fatigue, no polydipsia, no polyphagia and no weakness. Risk factors for coronary artery disease include diabetes mellitus and hypertension. Current diabetic treatment includes insulin injections and oral agent (monotherapy). She is compliant with treatment all of the time. Her weight is stable. She is following a diabetic diet. She has not had a previous visit with a dietitian. She does not see a podiatrist.Eye exam is current.  Long discussion held regarding her eyes she is seen eye specialist and doing injections try to help preserve him She takes her blood pressure medicines on a regular basis She watches fats in her diet as best she can and her starches She checks her sugars periodically She denies being depressed    Review of Systems  Constitutional: Negative for activity change, appetite change and fatigue.  HENT: Negative for congestion.   Respiratory: Negative for cough.   Cardiovascular: Negative for chest pain.  Gastrointestinal: Negative for abdominal pain.  Endocrine: Negative for polydipsia and polyphagia.  Neurological: Negative for weakness.  Psychiatric/Behavioral: Negative for confusion.       Objective:   Physical Exam  Constitutional: She appears well-nourished. No distress.  Cardiovascular: Normal rate, regular rhythm and normal heart sounds.   No murmur heard. Pulmonary/Chest: Effort normal and breath sounds normal. No respiratory distress.  Musculoskeletal: She exhibits no edema.  Lymphadenopathy:    She has no cervical adenopathy.  Neurological: She is alert. She exhibits normal muscle tone.  Psychiatric: Her behavior is normal.  Vitals  reviewed. arthritis in the knees   25 minutes was spent with the patient. Greater than half the time was spent in discussion and answering questions and counseling regarding the issues that the patient came in for today.      Assessment & Plan:  Diabetes good control continue current measures Obesity encouraged patient watch portions try to lose weight very difficult at her age Osteoarthritis both knees Tylenol as needed for the pain if worsens referral to specialist for injections Diabetic retinopathy issues continue with specialist Diabetic polyneuropathy of the feet diabetic shoes are reasonable for the patient Hypertension good control continue current measures Hyperlipidemia continue current measures watch diet closely Lab work indicated await the results

## 2015-08-26 ENCOUNTER — Other Ambulatory Visit: Payer: Self-pay | Admitting: *Deleted

## 2015-08-26 ENCOUNTER — Telehealth: Payer: Self-pay | Admitting: Family Medicine

## 2015-08-26 ENCOUNTER — Encounter: Payer: Self-pay | Admitting: Family Medicine

## 2015-08-26 LAB — LIPID PANEL
CHOL/HDL RATIO: 4.3 ratio (ref 0.0–4.4)
Cholesterol, Total: 176 mg/dL (ref 100–199)
HDL: 41 mg/dL (ref >39)
LDL Calculated: 101 mg/dL — ABNORMAL HIGH (ref 0–99)
TRIGLYCERIDES: 172 mg/dL — AB (ref 0–149)
VLDL Cholesterol Cal: 34 mg/dL (ref 5–40)

## 2015-08-26 LAB — BASIC METABOLIC PANEL
BUN/Creatinine Ratio: 24 (ref 12–28)
BUN: 15 mg/dL (ref 8–27)
CO2: 23 mmol/L (ref 18–29)
Calcium: 8.7 mg/dL (ref 8.7–10.3)
Chloride: 104 mmol/L (ref 96–106)
Creatinine, Ser: 0.62 mg/dL (ref 0.57–1.00)
GFR calc Af Amer: 100 mL/min/{1.73_m2} (ref >59)
GFR, EST NON AFRICAN AMERICAN: 87 mL/min/{1.73_m2} (ref >59)
Glucose: 115 mg/dL — ABNORMAL HIGH (ref 65–99)
POTASSIUM: 3.9 mmol/L (ref 3.5–5.2)
SODIUM: 145 mmol/L — AB (ref 134–144)

## 2015-08-26 LAB — MICROALBUMIN / CREATININE URINE RATIO
Creatinine, Urine: 124.1 mg/dL
MICROALB/CREAT RATIO: 21.6 mg/g creat (ref 0.0–30.0)
Microalbumin, Urine: 26.8 ug/mL

## 2015-08-26 LAB — HEPATIC FUNCTION PANEL
ALT: 24 IU/L (ref 0–32)
AST: 21 IU/L (ref 0–40)
Albumin: 4.1 g/dL (ref 3.5–4.8)
Alkaline Phosphatase: 67 IU/L (ref 39–117)
BILIRUBIN TOTAL: 0.4 mg/dL (ref 0.0–1.2)
Bilirubin, Direct: 0.11 mg/dL (ref 0.00–0.40)
TOTAL PROTEIN: 6.7 g/dL (ref 6.0–8.5)

## 2015-08-26 MED ORDER — METFORMIN HCL 1000 MG PO TABS: ORAL_TABLET | ORAL | Status: DC

## 2015-08-26 NOTE — Telephone Encounter (Signed)
Med sent to pharm. Pt notified on voicemail.  

## 2015-08-26 NOTE — Telephone Encounter (Signed)
Patient requesting Rx for metformin to [facility].

## 2015-09-04 ENCOUNTER — Other Ambulatory Visit: Payer: Self-pay

## 2015-09-04 NOTE — Telephone Encounter (Signed)
May have this and 5 refills

## 2015-09-05 MED ORDER — ALPRAZOLAM 0.5 MG PO TABS: ORAL_TABLET | ORAL | Status: DC

## 2015-09-10 ENCOUNTER — Other Ambulatory Visit: Payer: Self-pay | Admitting: Family Medicine

## 2015-09-12 ENCOUNTER — Other Ambulatory Visit: Payer: Self-pay | Admitting: Family Medicine

## 2015-09-15 ENCOUNTER — Other Ambulatory Visit: Payer: Self-pay

## 2015-09-15 MED ORDER — INSULIN GLARGINE 100 UNIT/ML SOLOSTAR PEN: PEN_INJECTOR | SUBCUTANEOUS | Status: DC

## 2015-09-18 ENCOUNTER — Telehealth: Payer: Self-pay | Admitting: Nurse Practitioner

## 2015-09-18 NOTE — Telephone Encounter (Signed)
I tried to send to nurses. Will try again.

## 2015-09-18 NOTE — Telephone Encounter (Signed)
Patient needs new CPAP machine and supplies. Her last machine was 5 years ago and is not working.

## 2015-09-19 NOTE — Telephone Encounter (Signed)
Nurses please let me know if you get this message

## 2015-10-01 ENCOUNTER — Telehealth: Payer: Self-pay | Admitting: Family Medicine

## 2015-10-01 NOTE — Telephone Encounter (Signed)
FYI - patient has appt here 10/03/15 to discuss her CPAP use & benefits Her current machine is broken and this OV is needed to get required documentation for a replacement CPAP The OV note must document that pt has OSA, the benefits of using the machine, and her pressure settings (currently using 10 cmH2O)  Once this is complete, I will write up order for doctor to sign & send all required documentation to [facility]   Please let me know when this is complete and I will proceed

## 2015-10-02 DIAGNOSIS — E113512 Type 2 diabetes mellitus with proliferative diabetic retinopathy with macular edema, left eye: Secondary | ICD-10-CM | POA: Diagnosis not present

## 2015-10-03 ENCOUNTER — Ambulatory Visit (INDEPENDENT_AMBULATORY_CARE_PROVIDER_SITE_OTHER): Payer: Medicare Other | Admitting: Family Medicine

## 2015-10-03 ENCOUNTER — Encounter: Payer: Self-pay | Admitting: Family Medicine

## 2015-10-03 VITALS — BP 110/72 | Ht 62.0 in | Wt 207.4 lb

## 2015-10-03 DIAGNOSIS — E119 Type 2 diabetes mellitus without complications: Secondary | ICD-10-CM

## 2015-10-03 DIAGNOSIS — M170 Bilateral primary osteoarthritis of knee: Secondary | ICD-10-CM

## 2015-10-03 DIAGNOSIS — G4733 Obstructive sleep apnea (adult) (pediatric): Secondary | ICD-10-CM | POA: Diagnosis not present

## 2015-10-03 NOTE — Progress Notes (Addendum)
   Subjective:    Patient ID: Shelly Jackson, female    DOB: [birth year], 81 y.o.   MRN: [id number]  HPI Patient in today to discuss CPAP therapy and benefits.   States no other concerns this visit. Greater than half the time spent with the patient discussing multiple different issues CPAP the patient has had a long history sleep apnea. She is on a CPAP machine. It does a good job for her. When she doesn't use it she has significant troubles. She needs new equipment as well as a prescription for this. Patient states difficult time getting around. Relates a lot of pain in her knees and balance issues. Would benefit from a cane Patient also had diabetic foot exam which shows bunions also shows neuropathy and decreased pulses. Patient also has pre-ulcerative calluses. Patient would benefit from diabetic shoes  Review of Systems Denies chest tightness pressure pain relates fatigue tiredness when she doesn't use her CPAP machine    Objective:   Physical Exam Lungs clear heart rate controlled extremities no edema diabetic foot exam see above Subjective low back pain. Osteoarthritis of both knees. Significant ataxia with walking.     Assessment & Plan:  CPAP machine is medically necessary prescription for equipment to be given to the patient we will send this in through our referral specialist  Diabetic foot exam shows the need for diabetic shoes prescription written out this of be sent in along with documentation for her to get her diabetic shoes  This patient has low back pain as well as arthritis in both knees. She would benefit from using a cane to get around plus also stability  Follow-up in approximate 4 months

## 2015-10-15 ENCOUNTER — Telehealth: Payer: Self-pay | Admitting: Family Medicine

## 2015-10-15 NOTE — Telephone Encounter (Signed)
CPAP order, ins info, demo, OV note, & sleep study faxed to [facility]  They will contact pt to set up  Ph# [phone], Fx# [phone]

## 2015-11-06 DIAGNOSIS — E113512 Type 2 diabetes mellitus with proliferative diabetic retinopathy with macular edema, left eye: Secondary | ICD-10-CM | POA: Diagnosis not present

## 2015-11-12 ENCOUNTER — Telehealth: Payer: Self-pay | Admitting: Family Medicine

## 2015-11-12 NOTE — Telephone Encounter (Signed)
[facility] requesting diagnosis code for the cane in your notes and also addendum to your office notes stating why patient needs cane.Here is a copy of recent note from 10/03/2015.In your yellow folder.

## 2015-11-12 NOTE — Telephone Encounter (Signed)
Addendum was completed 

## 2015-11-18 ENCOUNTER — Telehealth: Payer: Self-pay | Admitting: Family Medicine

## 2015-11-18 ENCOUNTER — Other Ambulatory Visit: Payer: Self-pay | Admitting: *Deleted

## 2015-11-18 MED ORDER — METFORMIN HCL 1000 MG PO TABS: ORAL_TABLET | ORAL | Status: DC

## 2015-11-18 NOTE — Telephone Encounter (Signed)
Refill sent to pharm. Patient notified on voicemail.

## 2015-11-18 NOTE — Telephone Encounter (Signed)
Pt is needing a refill on her   metFORMIN (GLUCOPHAGE) 1000 MG tablet         [place], [place]

## 2015-12-03 ENCOUNTER — Telehealth: Payer: Self-pay | Admitting: Family Medicine

## 2015-12-03 MED ORDER — ALPRAZOLAM 0.5 MG PO TABS: ORAL_TABLET | ORAL | 1 refills | Status: DC

## 2015-12-03 NOTE — Telephone Encounter (Signed)
Received fax from [facility] for a prescription refill request for Xanax. May we refill?

## 2015-12-03 NOTE — Telephone Encounter (Signed)
Prescription printed and faxed to pharmacy 

## 2015-12-03 NOTE — Addendum Note (Signed)
Addended by: Dairl Ponder on: 12/03/2015 04:43 PM   Modules accepted: Orders

## 2015-12-03 NOTE — Telephone Encounter (Signed)
Patient may have one refill plus one additional refill

## 2015-12-03 NOTE — Addendum Note (Signed)
Addended by: Dairl Ponder on: 12/03/2015 04:47 PM   Modules accepted: Orders

## 2015-12-11 DIAGNOSIS — E113512 Type 2 diabetes mellitus with proliferative diabetic retinopathy with macular edema, left eye: Secondary | ICD-10-CM | POA: Diagnosis not present

## 2015-12-17 ENCOUNTER — Ambulatory Visit: Payer: Medicare Other | Admitting: Family Medicine

## 2015-12-17 ENCOUNTER — Encounter: Payer: Self-pay | Admitting: Family Medicine

## 2016-01-06 ENCOUNTER — Encounter: Payer: Self-pay | Admitting: Family Medicine

## 2016-01-06 ENCOUNTER — Other Ambulatory Visit: Payer: Self-pay | Admitting: Family Medicine

## 2016-01-06 ENCOUNTER — Ambulatory Visit (INDEPENDENT_AMBULATORY_CARE_PROVIDER_SITE_OTHER): Payer: Medicare Other | Admitting: Family Medicine

## 2016-01-06 VITALS — BP 130/72 | Ht 62.0 in | Wt 211.2 lb

## 2016-01-06 DIAGNOSIS — E669 Obesity, unspecified: Secondary | ICD-10-CM | POA: Diagnosis not present

## 2016-01-06 DIAGNOSIS — Z794 Long term (current) use of insulin: Secondary | ICD-10-CM

## 2016-01-06 DIAGNOSIS — I10 Essential (primary) hypertension: Secondary | ICD-10-CM | POA: Diagnosis not present

## 2016-01-06 DIAGNOSIS — E782 Mixed hyperlipidemia: Secondary | ICD-10-CM

## 2016-01-06 DIAGNOSIS — E1142 Type 2 diabetes mellitus with diabetic polyneuropathy: Secondary | ICD-10-CM | POA: Diagnosis not present

## 2016-01-06 LAB — POCT GLYCOSYLATED HEMOGLOBIN (HGB A1C): HEMOGLOBIN A1C: 6.2

## 2016-01-06 MED ORDER — NIFEDIPINE ER OSMOTIC RELEASE 60 MG PO TB24: 60.0000 mg | ORAL_TABLET | Freq: Every day | ORAL | 1 refills | Status: DC

## 2016-01-06 MED ORDER — LOSARTAN POTASSIUM-HCTZ 50-12.5 MG PO TABS: ORAL_TABLET | ORAL | 1 refills | Status: DC

## 2016-01-06 MED ORDER — METFORMIN HCL 1000 MG PO TABS: ORAL_TABLET | ORAL | 0 refills | Status: DC

## 2016-01-06 MED ORDER — OXYBUTYNIN CHLORIDE ER 5 MG PO TB24: 5.0000 mg | ORAL_TABLET | Freq: Every day | ORAL | 1 refills | Status: DC

## 2016-01-06 MED ORDER — CITALOPRAM HYDROBROMIDE 20 MG PO TABS
20.0000 mg | ORAL_TABLET | Freq: Every day | ORAL | 1 refills | Status: DC
Start: 2016-01-06 — End: 2016-06-29

## 2016-01-06 MED ORDER — INSULIN GLARGINE 100 UNIT/ML SOLOSTAR PEN: PEN_INJECTOR | SUBCUTANEOUS | 5 refills | Status: DC

## 2016-01-06 NOTE — Progress Notes (Signed)
   Subjective:    Patient ID: Shelly Jackson, female    DOB: 1934-09-11, 81 y.o.   MRN: [id number]  HPI Patient in today to discuss benefits of CPAP therapy to have CPAP reinstated.   face-to-face visit was done today with the discussion of her CPAP she uses this on a regular basis. She states it greatly helps her. She is not having any trouble using it. And she finds that she even uses it during the day if she takes a nap her energy levels better focus is better and not sleepy.   she does take her blood pressure medicine regular basis denies any low blood pressures She takes her insulin 48 units each night her A1c overall looking very good denies any low sugar spells  she does take her cholesterol medicine on a regular basis and will need repeat of a lipid profile this fall previous lab work reviewed with patient Patient needs refills on Citalopram, and procardia.  she does suffer with obesity she does try to watch her diet she is limited in her activity  Review of Systems  Constitutional: Negative for activity change, appetite change and fatigue.  HENT: Negative for congestion.   Respiratory: Negative for cough.   Cardiovascular: Negative for chest pain.  Gastrointestinal: Negative for abdominal pain.  Endocrine: Negative for polydipsia and polyphagia.  Neurological: Negative for weakness.  Psychiatric/Behavioral: Negative for confusion.       Objective:   Physical Exam  Constitutional: She appears well-nourished. No distress.  Cardiovascular: Normal rate, regular rhythm and normal heart sounds.   No murmur heard. Pulmonary/Chest: Effort normal and breath sounds normal. No respiratory distress.  Musculoskeletal: She exhibits no edema.  Lymphadenopathy:    She has no cervical adenopathy.  Neurological: She is alert. She exhibits normal muscle tone.  Psychiatric: Her behavior is normal.  Vitals reviewed.   severe bunions noted on the feet. Patient does not one have surgery but  she states podiatry might at some point recommend surgery she does have some mild neuropathy       Assessment & Plan:  [facility] -we will send information regarding CPAP to them   patient does benefit from CPAP she is using it on a very regular basis. She finds better energy better concentration and better ability to follow through the day. And states sleepiness is much less.   diabetes-A1c overall looking good. Reduce metformin to a half a tablet in the morning half in the evening.   Blood pressure good control continue current measures watch diet closely  Obesity encourage watch diet stay active try to lose weight   Depression stable continue Celexa.  Xanax when necessary to help with sleep caution drowsiness

## 2016-01-07 ENCOUNTER — Telehealth: Payer: Self-pay | Admitting: Family Medicine

## 2016-01-07 NOTE — Telephone Encounter (Signed)
Faxed OV note to [facility] in [place], [place] per Dr. Bary Leriche request - see note below  Ph# [phone] Fx# [phone]  Message  Received: Yesterday  Message Contents  Kathyrn Drown, MD  Cyndie Chime  Please send a copy of this office visit to [facility] in [facility] regarding her CPAP usage and machine   Office Visit for Sleep Apnea  01/06/2016  Kathyrn Drown, MD - [facility] Encounter Summary Diagnoses    Htn (hypertension), Benign (Primary) Type 2 diabetes mellitus with diabetic polyneuropathy, with long-term current use of insulin ([place]); Hyperlipemia, mixed; Diabetic peripheral neuropathy ([place]); Obesity

## 2016-01-22 DIAGNOSIS — E113512 Type 2 diabetes mellitus with proliferative diabetic retinopathy with macular edema, left eye: Secondary | ICD-10-CM | POA: Diagnosis not present

## 2016-01-22 DIAGNOSIS — E782 Mixed hyperlipidemia: Secondary | ICD-10-CM | POA: Diagnosis not present

## 2016-01-23 LAB — LIPID PANEL
Chol/HDL Ratio: 4.5 ratio units — ABNORMAL HIGH (ref 0.0–4.4)
Cholesterol, Total: 189 mg/dL (ref 100–199)
HDL: 42 mg/dL (ref >39)
LDL Calculated: 97 mg/dL (ref 0–99)
Triglycerides: 249 mg/dL — ABNORMAL HIGH (ref 0–149)
VLDL Cholesterol Cal: 50 mg/dL — ABNORMAL HIGH (ref 5–40)

## 2016-02-26 ENCOUNTER — Telehealth: Payer: Self-pay | Admitting: Family Medicine

## 2016-02-26 DIAGNOSIS — E113512 Type 2 diabetes mellitus with proliferative diabetic retinopathy with macular edema, left eye: Secondary | ICD-10-CM | POA: Diagnosis not present

## 2016-02-26 DIAGNOSIS — E785 Hyperlipidemia, unspecified: Secondary | ICD-10-CM

## 2016-02-26 DIAGNOSIS — I10 Essential (primary) hypertension: Secondary | ICD-10-CM

## 2016-02-26 DIAGNOSIS — E113511 Type 2 diabetes mellitus with proliferative diabetic retinopathy with macular edema, right eye: Secondary | ICD-10-CM | POA: Diagnosis not present

## 2016-02-26 DIAGNOSIS — E119 Type 2 diabetes mellitus without complications: Secondary | ICD-10-CM

## 2016-02-26 NOTE — Telephone Encounter (Signed)
Patient had her lab work completed in September.  She said we were going to call in a cholesterol medication for her, but she checked with the pharmacy and they never received this medication.  Before she completed the labs, she forgot she was supposed to fast and ate before she had the labs completed.  She said we were going to check with Dr. Nicki Reaper and see if he wanted her to have these labs drawn again, but she never got a call back.     [facility] is her local pharmacy [facility] is her mail order

## 2016-02-26 NOTE — Telephone Encounter (Signed)
I went back and reviewed over the previous labs as well as a notes with it. At that time I recommended Lipitor 10 mg 1 daily. The patient states that she had 8 and she thinks that through all her lab work. To give her the benefit of the doubt. I recommend checking lipid profile fasting. Hold off on sending in any cholesterol medicine until she completes lipid profile fasting.

## 2016-02-26 NOTE — Telephone Encounter (Signed)
Left message to return call 

## 2016-02-27 NOTE — Telephone Encounter (Signed)
Spoke with patient and informed her per Dr.Steve Luking- Dr.Scott  went back and reviewed over the previous labs as well as a notes with it. At that time He recommended Lipitor 10 mg 1 daily. You  stated that she had ate and you think that threw off  all your lab work. To give you  the benefit of the doubt. Dr.Scott  recommends checking lipid profile fasting. Hold off on sending in any cholesterol medicine until you complete lipid profile fasting. Patient verbalized understanding. Order for lipid profile being ordered in epic.

## 2016-03-05 DIAGNOSIS — E785 Hyperlipidemia, unspecified: Secondary | ICD-10-CM | POA: Diagnosis not present

## 2016-03-06 LAB — LIPID PANEL
CHOLESTEROL TOTAL: 181 mg/dL (ref 100–199)
Chol/HDL Ratio: 4.4 ratio units (ref 0.0–4.4)
HDL: 41 mg/dL (ref >39)
LDL Calculated: 108 mg/dL — ABNORMAL HIGH (ref 0–99)
TRIGLYCERIDES: 160 mg/dL — AB (ref 0–149)
VLDL CHOLESTEROL CAL: 32 mg/dL (ref 5–40)

## 2016-03-08 MED ORDER — ATORVASTATIN CALCIUM 10 MG PO TABS: 10.0000 mg | ORAL_TABLET | Freq: Every day | ORAL | 1 refills | Status: DC

## 2016-03-08 NOTE — Addendum Note (Signed)
Addended by: Ofilia Neas R on: 03/08/2016 02:50 PM   Modules accepted: Orders

## 2016-03-30 DIAGNOSIS — E113512 Type 2 diabetes mellitus with proliferative diabetic retinopathy with macular edema, left eye: Secondary | ICD-10-CM | POA: Diagnosis not present

## 2016-04-07 ENCOUNTER — Other Ambulatory Visit: Payer: Self-pay | Admitting: *Deleted

## 2016-04-07 MED ORDER — INSULIN GLARGINE 100 UNIT/ML SOLOSTAR PEN: PEN_INJECTOR | SUBCUTANEOUS | 0 refills | Status: DC

## 2016-04-15 ENCOUNTER — Other Ambulatory Visit: Payer: Self-pay | Admitting: Family Medicine

## 2016-04-27 ENCOUNTER — Other Ambulatory Visit: Payer: Self-pay | Admitting: Family Medicine

## 2016-04-29 DIAGNOSIS — R197 Diarrhea, unspecified: Secondary | ICD-10-CM | POA: Diagnosis not present

## 2016-05-07 ENCOUNTER — Other Ambulatory Visit: Payer: Self-pay | Admitting: Family Medicine

## 2016-05-11 DIAGNOSIS — E113511 Type 2 diabetes mellitus with proliferative diabetic retinopathy with macular edema, right eye: Secondary | ICD-10-CM | POA: Diagnosis not present

## 2016-05-11 DIAGNOSIS — E113512 Type 2 diabetes mellitus with proliferative diabetic retinopathy with macular edema, left eye: Secondary | ICD-10-CM | POA: Diagnosis not present

## 2016-05-19 ENCOUNTER — Other Ambulatory Visit: Payer: Self-pay | Admitting: *Deleted

## 2016-05-19 MED ORDER — ALPRAZOLAM 0.5 MG PO TABS: ORAL_TABLET | ORAL | 1 refills | Status: DC

## 2016-05-19 NOTE — Telephone Encounter (Signed)
She may have 90 day with one additional refill

## 2016-06-22 DIAGNOSIS — E113511 Type 2 diabetes mellitus with proliferative diabetic retinopathy with macular edema, right eye: Secondary | ICD-10-CM | POA: Diagnosis not present

## 2016-06-22 DIAGNOSIS — E113512 Type 2 diabetes mellitus with proliferative diabetic retinopathy with macular edema, left eye: Secondary | ICD-10-CM | POA: Diagnosis not present

## 2016-06-29 ENCOUNTER — Ambulatory Visit (INDEPENDENT_AMBULATORY_CARE_PROVIDER_SITE_OTHER): Payer: Medicare Other | Admitting: Family Medicine

## 2016-06-29 ENCOUNTER — Encounter: Payer: Self-pay | Admitting: Family Medicine

## 2016-06-29 VITALS — BP 130/74 | Ht 62.0 in | Wt 207.6 lb

## 2016-06-29 DIAGNOSIS — D509 Iron deficiency anemia, unspecified: Secondary | ICD-10-CM | POA: Diagnosis not present

## 2016-06-29 DIAGNOSIS — E119 Type 2 diabetes mellitus without complications: Secondary | ICD-10-CM | POA: Diagnosis not present

## 2016-06-29 DIAGNOSIS — E133399 Other specified diabetes mellitus with moderate nonproliferative diabetic retinopathy without macular edema, unspecified eye: Secondary | ICD-10-CM | POA: Diagnosis not present

## 2016-06-29 DIAGNOSIS — E782 Mixed hyperlipidemia: Secondary | ICD-10-CM

## 2016-06-29 DIAGNOSIS — I10 Essential (primary) hypertension: Secondary | ICD-10-CM

## 2016-06-29 LAB — POCT GLYCOSYLATED HEMOGLOBIN (HGB A1C): Hemoglobin A1C: 6

## 2016-06-29 MED ORDER — TRIAMCINOLONE ACETONIDE 0.1 % EX CREA: TOPICAL_CREAM | CUTANEOUS | 3 refills | Status: DC

## 2016-06-29 MED ORDER — OXYBUTYNIN CHLORIDE ER 5 MG PO TB24: 5.0000 mg | ORAL_TABLET | Freq: Every day | ORAL | 1 refills | Status: DC

## 2016-06-29 MED ORDER — CITALOPRAM HYDROBROMIDE 20 MG PO TABS: 20.0000 mg | ORAL_TABLET | Freq: Every day | ORAL | 1 refills | Status: DC

## 2016-06-29 MED ORDER — DOXYCYCLINE HYCLATE 100 MG PO CAPS: 100.0000 mg | ORAL_CAPSULE | Freq: Two times a day (BID) | ORAL | 0 refills | Status: DC

## 2016-06-29 MED ORDER — LOSARTAN POTASSIUM-HCTZ 50-12.5 MG PO TABS: ORAL_TABLET | ORAL | 1 refills | Status: DC

## 2016-06-29 MED ORDER — NIFEDIPINE ER OSMOTIC RELEASE 60 MG PO TB24: 60.0000 mg | ORAL_TABLET | Freq: Every day | ORAL | 1 refills | Status: DC

## 2016-06-29 MED ORDER — INSULIN GLARGINE 100 UNIT/ML SOLOSTAR PEN: PEN_INJECTOR | SUBCUTANEOUS | 5 refills | Status: DC

## 2016-06-29 MED ORDER — METFORMIN HCL 1000 MG PO TABS: ORAL_TABLET | ORAL | 1 refills | Status: DC

## 2016-06-29 MED ORDER — ATORVASTATIN CALCIUM 10 MG PO TABS: 10.0000 mg | ORAL_TABLET | Freq: Every day | ORAL | 1 refills | Status: DC

## 2016-06-29 NOTE — Progress Notes (Signed)
   Subjective:    Patient ID: Shelly Jackson, female    DOB: 05-Oct-1934, 82 y.o.   MRN: [id number]  Diabetes  She presents for her follow-up diabetic visit. She has type 2 diabetes mellitus. Pertinent negatives for hypoglycemia include no confusion. Pertinent negatives for diabetes include no chest pain, no fatigue, no polydipsia, no polyphagia and no weakness. Risk factors for coronary artery disease include diabetes mellitus, hypertension, dyslipidemia and post-menopausal. Current diabetic treatment includes insulin injections. She is compliant with treatment all of the time. Her weight is stable. She is following a diabetic diet.   Long discussion held with patient regarding her medication She takes Xanax intermittently to help her sleep at night denies abusing it She denies any low sugar spells although a couple of her readings were in the mid 80s She does take her blood pressure medicine watch his salt diet tries to do some activity Stays busy with church Does take her cholesterol medicine previous labs reviewed Tolerates medication no side effects She does have itching around the abdomen abdominal bellybutton Patient also has a sore that developed on her chest She denies being depressed states Celexa helps her   Review of Systems  Constitutional: Negative for activity change, appetite change and fatigue.  HENT: Negative for congestion.   Respiratory: Negative for cough.   Cardiovascular: Negative for chest pain.  Gastrointestinal: Negative for abdominal pain.  Endocrine: Negative for polydipsia and polyphagia.  Neurological: Negative for weakness.  Psychiatric/Behavioral: Negative for confusion.       Objective:   Physical Exam  Constitutional: She appears well-nourished. No distress.  Cardiovascular: Normal rate, regular rhythm and normal heart sounds.   No murmur heard. Pulmonary/Chest: Effort normal and breath sounds normal. No respiratory distress.  Musculoskeletal: She  exhibits no edema.  Lymphadenopathy:    She has no cervical adenopathy.  Neurological: She is alert. She exhibits normal muscle tone.  Psychiatric: Her behavior is normal.  Vitals reviewed.   25 minutes was spent with the patient. Greater than half the time was spent in discussion and answering questions and counseling regarding the issues that the patient came in for today.       Assessment & Plan:  Diabetes excellent control May reduce metformin to a half tablet morning half in the evening watch diet closely stay physically active  Hot blood pressure good control continue current measures watch salt diet  Hyperlipidemia previous labs look good. Ordered. Continue current measures watch diet closely  Diabetic retinopathy follow-up with specialist on regular basis  Anemia in the past recheck CBC  Morbid obesity encouraged patient to watch diet try to lose weight  Significant bunions on both feet along with some slight neuropathy watch the closely daily inspection by patient recommended  Patient does have a developing skin sore versus cyst on her chest dockside can twice a day next 10 days if it persists or gets worse follow-up may need I&D  Itching around the bellybutton seems to be more of a eczema-like condition try triamcinolone

## 2016-06-30 LAB — LIPID PANEL
CHOLESTEROL TOTAL: 163 mg/dL (ref 100–199)
Chol/HDL Ratio: 3.5 (ref 0.0–4.4)
HDL: 46 mg/dL (ref >39)
LDL CALC: 91 (ref 0–99)
TRIGLYCERIDES: 129 mg/dL (ref 0–149)
VLDL CHOLESTEROL CAL: 26 (ref 5–40)

## 2016-06-30 LAB — BASIC METABOLIC PANEL
BUN/Creatinine Ratio: 24 (ref 12–28)
BUN: 20 mg/dL (ref 8–27)
CALCIUM: 9 mg/dL (ref 8.7–10.3)
CO2: 23 mmol/L (ref 18–29)
Chloride: 99 mmol/L (ref 96–106)
Creatinine, Ser: 0.82 mg/dL (ref 0.57–1.00)
GFR calc non Af Amer: 68 (ref >59)
GFR, EST AFRICAN AMERICAN: 79 (ref >59)
Glucose: 122 mg/dL — ABNORMAL HIGH (ref 65–99)
POTASSIUM: 3.9 mmol/L (ref 3.5–5.2)
Sodium: 141 mmol/L (ref 134–144)

## 2016-06-30 LAB — CBC WITH DIFFERENTIAL/PLATELET
BASOS ABS: 0 10*3/uL (ref 0.0–0.2)
BASOS: 0 %
EOS (ABSOLUTE): 0.2 10*3/uL (ref 0.0–0.4)
Eos: 2 %
HEMOGLOBIN: 13.6 g/dL (ref 11.1–15.9)
Hematocrit: 41 % (ref 34.0–46.6)
IMMATURE GRANS (ABS): 0 10*3/uL (ref 0.0–0.1)
Immature Granulocytes: 0 %
LYMPHS ABS: 2.1 10*3/uL (ref 0.7–3.1)
LYMPHS: 26 %
MCH: 29.1 pg (ref 26.6–33.0)
MCHC: 33.2 g/dL (ref 31.5–35.7)
MCV: 88 fL (ref 79–97)
MONOCYTES: 5 %
Monocytes Absolute: 0.4 10*3/uL (ref 0.1–0.9)
NEUTROS ABS: 5.6 10*3/uL (ref 1.4–7.0)
Neutrophils: 67 %
Platelets: 186 10*3/uL (ref 150–379)
RBC: 4.67 x10E6/uL (ref 3.77–5.28)
RDW: 14.1 % (ref 12.3–15.4)
WBC: 8.3 10*3/uL (ref 3.4–10.8)

## 2016-06-30 LAB — HEPATIC FUNCTION PANEL
ALBUMIN: 4.4 g/dL (ref 3.5–4.8)
ALT: 18 IU/L (ref 0–32)
AST: 19 IU/L (ref 0–40)
Alkaline Phosphatase: 70 IU/L (ref 39–117)
BILIRUBIN TOTAL: 0.6 mg/dL (ref 0.0–1.2)
BILIRUBIN, DIRECT: 0.14 mg/dL (ref 0.00–0.40)
Total Protein: 7 g/dL (ref 6.0–8.5)

## 2016-07-01 ENCOUNTER — Other Ambulatory Visit: Payer: Self-pay | Admitting: *Deleted

## 2016-07-01 MED ORDER — ATORVASTATIN CALCIUM 20 MG PO TABS: 20.0000 mg | ORAL_TABLET | Freq: Every day | ORAL | 1 refills | Status: DC

## 2016-07-09 ENCOUNTER — Other Ambulatory Visit: Payer: Self-pay | Admitting: *Deleted

## 2016-07-09 MED ORDER — INSULIN GLARGINE 100 UNIT/ML SOLOSTAR PEN: PEN_INJECTOR | SUBCUTANEOUS | 2 refills | Status: DC

## 2016-07-22 ENCOUNTER — Other Ambulatory Visit: Payer: Self-pay | Admitting: Family Medicine

## 2016-07-27 ENCOUNTER — Telehealth: Payer: Self-pay | Admitting: Family Medicine

## 2016-07-27 NOTE — Telephone Encounter (Signed)
Patient said she spoke with Hoyle Sauer at home over the weekend and was told she was going to have a new testing meter and test strips called in but the pharmacy says they never received anything.  Please advise.  [place], [place]

## 2016-07-29 NOTE — Telephone Encounter (Signed)
Pt called checking on this. Pt is needing this sent in as soon as possible.

## 2016-07-29 NOTE — Telephone Encounter (Signed)
Spoke with patient and informed her per Linzie Collin- we are sending in the prescription for diabetic meter and test strips. Patient verbalized understanding.

## 2016-07-29 NOTE — Telephone Encounter (Signed)
Prescription writtten

## 2016-08-03 DIAGNOSIS — E113512 Type 2 diabetes mellitus with proliferative diabetic retinopathy with macular edema, left eye: Secondary | ICD-10-CM | POA: Diagnosis not present

## 2016-09-07 DIAGNOSIS — E113512 Type 2 diabetes mellitus with proliferative diabetic retinopathy with macular edema, left eye: Secondary | ICD-10-CM | POA: Diagnosis not present

## 2016-09-07 DIAGNOSIS — E113511 Type 2 diabetes mellitus with proliferative diabetic retinopathy with macular edema, right eye: Secondary | ICD-10-CM | POA: Diagnosis not present

## 2016-10-19 DIAGNOSIS — E113551 Type 2 diabetes mellitus with stable proliferative diabetic retinopathy, right eye: Secondary | ICD-10-CM | POA: Diagnosis not present

## 2016-10-19 DIAGNOSIS — H43811 Vitreous degeneration, right eye: Secondary | ICD-10-CM | POA: Diagnosis not present

## 2016-10-19 DIAGNOSIS — E113512 Type 2 diabetes mellitus with proliferative diabetic retinopathy with macular edema, left eye: Secondary | ICD-10-CM | POA: Diagnosis not present

## 2016-10-19 DIAGNOSIS — H3563 Retinal hemorrhage, bilateral: Secondary | ICD-10-CM | POA: Diagnosis not present

## 2016-10-19 LAB — HM DIABETES EYE EXAM

## 2016-10-26 ENCOUNTER — Encounter: Payer: Self-pay | Admitting: *Deleted

## 2016-11-11 ENCOUNTER — Other Ambulatory Visit: Payer: Self-pay | Admitting: Family Medicine

## 2016-11-13 DIAGNOSIS — J069 Acute upper respiratory infection, unspecified: Secondary | ICD-10-CM | POA: Diagnosis not present

## 2016-11-13 DIAGNOSIS — Z6838 Body mass index (BMI) 38.0-38.9, adult: Secondary | ICD-10-CM | POA: Diagnosis not present

## 2016-11-18 LAB — HM DIABETES EYE EXAM

## 2016-11-19 ENCOUNTER — Encounter: Payer: Self-pay | Admitting: *Deleted

## 2016-11-26 ENCOUNTER — Other Ambulatory Visit: Payer: Self-pay | Admitting: Family Medicine

## 2016-12-02 ENCOUNTER — Telehealth: Payer: Self-pay | Admitting: Family Medicine

## 2016-12-02 MED ORDER — ALPRAZOLAM 0.5 MG PO TABS: ORAL_TABLET | ORAL | 1 refills | Status: DC

## 2016-12-02 NOTE — Telephone Encounter (Signed)
Patient may have 90, 1 daily at bedtime when necessary, 1 refill

## 2016-12-02 NOTE — Telephone Encounter (Signed)
Spoke with patient and informed her that we are going to send in prescription to [facility]. Patient verbalized understanding.

## 2016-12-02 NOTE — Telephone Encounter (Signed)
Pt needs refill on her ALPRAZolam (XANAX) 0.5 MG tablet  Pt needs it sent in today to [facility]  Please call pt when done

## 2016-12-28 ENCOUNTER — Ambulatory Visit (INDEPENDENT_AMBULATORY_CARE_PROVIDER_SITE_OTHER): Payer: Medicare Other | Admitting: Family Medicine

## 2016-12-28 ENCOUNTER — Encounter: Payer: Self-pay | Admitting: Family Medicine

## 2016-12-28 ENCOUNTER — Telehealth: Payer: Self-pay | Admitting: *Deleted

## 2016-12-28 VITALS — BP 114/62 | Ht 62.0 in | Wt 205.0 lb

## 2016-12-28 DIAGNOSIS — E119 Type 2 diabetes mellitus without complications: Secondary | ICD-10-CM

## 2016-12-28 DIAGNOSIS — E1142 Type 2 diabetes mellitus with diabetic polyneuropathy: Secondary | ICD-10-CM | POA: Diagnosis not present

## 2016-12-28 DIAGNOSIS — I10 Essential (primary) hypertension: Secondary | ICD-10-CM

## 2016-12-28 LAB — POCT GLYCOSYLATED HEMOGLOBIN (HGB A1C): HEMOGLOBIN A1C: 6.5

## 2016-12-28 MED ORDER — MOMETASONE FUROATE 0.1 % EX CREA: 1.0000 "application " | TOPICAL_CREAM | Freq: Two times a day (BID) | CUTANEOUS | 0 refills | Status: DC

## 2016-12-28 MED ORDER — NIFEDIPINE ER OSMOTIC RELEASE 60 MG PO TB24: 60.0000 mg | ORAL_TABLET | Freq: Every day | ORAL | 1 refills | Status: DC

## 2016-12-28 MED ORDER — INSULIN GLARGINE 100 UNIT/ML SOLOSTAR PEN: PEN_INJECTOR | SUBCUTANEOUS | 5 refills | Status: DC

## 2016-12-28 MED ORDER — LOSARTAN POTASSIUM-HCTZ 50-12.5 MG PO TABS: ORAL_TABLET | ORAL | 1 refills | Status: DC

## 2016-12-28 MED ORDER — METFORMIN HCL 1000 MG PO TABS: ORAL_TABLET | ORAL | 1 refills | Status: DC

## 2016-12-28 MED ORDER — ATORVASTATIN CALCIUM 20 MG PO TABS: 20.0000 mg | ORAL_TABLET | Freq: Every day | ORAL | 1 refills | Status: DC

## 2016-12-28 MED ORDER — CITALOPRAM HYDROBROMIDE 20 MG PO TABS: 20.0000 mg | ORAL_TABLET | Freq: Every day | ORAL | 1 refills | Status: DC

## 2016-12-28 MED ORDER — OXYBUTYNIN CHLORIDE ER 5 MG PO TB24: 5.0000 mg | ORAL_TABLET | Freq: Every day | ORAL | 1 refills | Status: DC

## 2016-12-28 NOTE — Telephone Encounter (Signed)
Try Elocon cream 30 g tube apply twice a day when necessary

## 2016-12-28 NOTE — Progress Notes (Signed)
   Subjective:    Patient ID: Shelly Jackson, female    DOB: March 06, 1935, 82 y.o.   MRN: [id number]  Diabetes  She has type 2 diabetes mellitus. Pertinent negatives for hypoglycemia include no confusion. Pertinent negatives for diabetes include no chest pain, no fatigue, no polydipsia, no polyphagia and no weakness.   Eats healthy,gets some exercise by walking,when able.See the eye Dr regularly.Does not see the foot Dr.  She is on Metformin.  Results for orders placed or performed in visit on 12/28/16  POCT glycosylated hemoglobin (Hb A1C)  Result Value Ref Range   Hemoglobin A1C 6.5   Patient blood pressure issues takes her medicine on a regular basis denies headaches Under a lot of stress her son is in the ICU her other son is on work release from prison Depression under decent control with citalopram Takes her cholesterol medicine previous labs to be new labs will be ordered on the next visit Takes her acid blocker does well with this Takes her Xanax at bedtime and does well Does not have any low sugar spells  Review of Systems  Constitutional: Negative for activity change, appetite change and fatigue.  HENT: Negative for congestion.   Respiratory: Negative for cough.   Cardiovascular: Negative for chest pain.  Gastrointestinal: Negative for abdominal pain.  Endocrine: Negative for polydipsia and polyphagia.  Skin: Negative for color change.  Neurological: Negative for weakness.  Psychiatric/Behavioral: Negative for confusion.       Objective:   Physical Exam  Constitutional: She appears well-developed and well-nourished. No distress.  HENT:  Head: Normocephalic and atraumatic.  Eyes: Right eye exhibits no discharge. Left eye exhibits no discharge.  Neck: No tracheal deviation present.  Cardiovascular: Normal rate, regular rhythm and normal heart sounds.   No murmur heard. Pulmonary/Chest: Effort normal and breath sounds normal. No respiratory distress. She has no  wheezes. She has no rales.  Musculoskeletal: She exhibits no edema.  Lymphadenopathy:    She has no cervical adenopathy.  Neurological: She is alert. She exhibits normal muscle tone.  Skin: Skin is warm and dry. No erythema.  Psychiatric: Her behavior is normal.  Vitals reviewed.  Diabetic foot exam severe bunions mild neuropathy pre-ulcerative calluses patient would benefit from diabetic shoes 25 minutes was spent with the patient. Greater than half the time was spent in discussion and answering questions and counseling regarding the issues that the patient came in for today.       Assessment & Plan:  HTN good control continue current measures watch salt diet Hyperlipidemia continue current measures previous labs reviewed Depression good control with Celexa continue current measures Xanax at bedtime for insomnia Oxybutynin for overactive bladder

## 2016-12-28 NOTE — Telephone Encounter (Signed)
Pt seen today. Forgot to ask you about itchiness in belly button. Was seen for this at last visit in February. Note states eczema like condition. Pt states just itches, no pain, no redness, no drainage. Triamcinolone cream not helping. Can something else be used. [facility].

## 2016-12-28 NOTE — Telephone Encounter (Signed)
Prescription sent electronically to pharmacy. Patient notified. 

## 2016-12-29 ENCOUNTER — Encounter: Payer: Self-pay | Admitting: Family Medicine

## 2016-12-29 LAB — BASIC METABOLIC PANEL
BUN / CREAT RATIO: 19 (ref 12–28)
BUN: 15 mg/dL (ref 8–27)
CO2: 23 mmol/L (ref 20–29)
CREATININE: 0.79 mg/dL (ref 0.57–1.00)
Calcium: 8.9 mg/dL (ref 8.7–10.3)
Chloride: 104 mmol/L (ref 96–106)
GFR calc Af Amer: 82 mL/min/{1.73_m2} (ref >59)
GFR calc non Af Amer: 71 mL/min/{1.73_m2} (ref >59)
GLUCOSE: 100 mg/dL — AB (ref 65–99)
Potassium: 3.8 mmol/L (ref 3.5–5.2)
SODIUM: 145 mmol/L — AB (ref 134–144)

## 2017-01-21 ENCOUNTER — Other Ambulatory Visit: Payer: Self-pay | Admitting: *Deleted

## 2017-01-21 MED ORDER — INSULIN GLARGINE 100 UNIT/ML SOLOSTAR PEN: PEN_INJECTOR | SUBCUTANEOUS | 1 refills | Status: DC

## 2017-01-27 ENCOUNTER — Encounter: Payer: Self-pay | Admitting: Family Medicine

## 2017-01-27 ENCOUNTER — Ambulatory Visit (INDEPENDENT_AMBULATORY_CARE_PROVIDER_SITE_OTHER): Payer: Medicare Other | Admitting: Family Medicine

## 2017-01-27 VITALS — BP 144/62 | Temp 99.1°F | Ht 62.0 in | Wt 207.0 lb

## 2017-01-27 DIAGNOSIS — N3001 Acute cystitis with hematuria: Secondary | ICD-10-CM

## 2017-01-27 DIAGNOSIS — R319 Hematuria, unspecified: Secondary | ICD-10-CM | POA: Diagnosis not present

## 2017-01-27 LAB — POCT URINALYSIS DIPSTICK
Glucose, UA: 500
PH UA: 6 (ref 5.0–8.0)
SPEC GRAV UA: 1.015 (ref 1.010–1.025)

## 2017-01-27 MED ORDER — CEFPROZIL 500 MG PO TABS: 500.0000 mg | ORAL_TABLET | Freq: Two times a day (BID) | ORAL | 0 refills | Status: DC

## 2017-01-27 NOTE — Progress Notes (Signed)
   Subjective:    Patient ID: Shelly Jackson, female    DOB: December 30, 1934, 82 y.o.   MRN: [id number]  Hematuria   Patient reports having seen blood in her urine yesterday.Has no urgency nor pain. States she is currently out of Ditropan and did not know if this had anything to do with the hematuria. There is been some hematuria along with urinary frequency she denies urgency or pain she denies sweats chills abdominal pain she denies any other particular troubles currently Review of Systems  Genitourinary: Positive for hematuria.  Please see above Results for orders placed or performed in visit on 01/27/17  POCT urinalysis dipstick  Result Value Ref Range   Color, UA     Clarity, UA     Glucose, UA 500    Bilirubin, UA     Ketones, UA     Spec Grav, UA 1.015 1.010 - 1.025   Blood, UA Moderate    pH, UA 6.0 5.0 - 8.0   Protein, UA     Urobilinogen, UA  0.2 or 1.0 E.U./dL   Nitrite, UA     Leukocytes, UA Trace (A) Negative       Objective:   Physical Exam Lungs clear heart rate is controlled pulses normal extremities no edema skin warm dry abdomen is soft no flank tenderness to percussion neck no masses       Assessment & Plan:  Hematuria Possible UTI Urine culture sent today Antibiotics prescribed Recheck patient within 7-10 days to recheck urine Of culture negative patient will need CT scan and cystoscope If ongoing troubles or if worse let us know

## 2017-01-28 DIAGNOSIS — N3001 Acute cystitis with hematuria: Secondary | ICD-10-CM | POA: Diagnosis not present

## 2017-01-28 DIAGNOSIS — R319 Hematuria, unspecified: Secondary | ICD-10-CM | POA: Diagnosis not present

## 2017-01-28 NOTE — Addendum Note (Signed)
Addended by: Karle Barr on: 01/28/2017 08:08 AM   Modules accepted: Orders

## 2017-01-30 LAB — URINE CULTURE

## 2017-02-01 ENCOUNTER — Telehealth: Payer: Self-pay

## 2017-02-01 DIAGNOSIS — E113512 Type 2 diabetes mellitus with proliferative diabetic retinopathy with macular edema, left eye: Secondary | ICD-10-CM | POA: Diagnosis not present

## 2017-02-01 DIAGNOSIS — H3561 Retinal hemorrhage, right eye: Secondary | ICD-10-CM | POA: Diagnosis not present

## 2017-02-01 DIAGNOSIS — H43811 Vitreous degeneration, right eye: Secondary | ICD-10-CM | POA: Diagnosis not present

## 2017-02-01 DIAGNOSIS — E113511 Type 2 diabetes mellitus with proliferative diabetic retinopathy with macular edema, right eye: Secondary | ICD-10-CM | POA: Diagnosis not present

## 2017-02-01 NOTE — Telephone Encounter (Signed)
Let message to return call 

## 2017-02-01 NOTE — Telephone Encounter (Signed)
With this patient having hematuria on recent visit I would not recommend being on anti-inflammatory until this issue gets resolved. Also please see notation regarding her urine culture-it appears that a message was left for her to return phone call. She will need to have CT scan and referral to urology

## 2017-02-01 NOTE — Telephone Encounter (Signed)
Patient is requesting refill on Meloxicam 15 mg one daily, from [facility]. Please advise.Thanks,CS

## 2017-02-02 NOTE — Telephone Encounter (Signed)
Results discussed with patient. Patient advised Urine culture is negative. This means that the blood in her urine is coming from some other source. In other words we need to check her kidneys and bladder for causes of bleeding. Patient needs CT scan of the abdomen and pelvis with and without contrast. This is called a CT urography which will look at the kidneys and the bladder. She also needs referral to [facility] for cystoscopy. If possible please get the CT scan done this week so we will have results back before her office visit on Friday. If unable to get the CT scan by then, it would be necessary to move her office visit to be a follow-up after the CAT scan 1-2 days later. Patient verbalized understanding but stated she wanted to hold on these recommendations at this time. Patient stated she will talk with Dr Nicki Reaper at her appointment on Friday. Patient states the bleeding stopped over the weekend and her son is in ICU and she wants to hold on this -will discuss further with doctor at visit on Friday. Patient was also advised to hold on anti inflammatories until issue is resolved and patient verbalized understanding.

## 2017-02-04 ENCOUNTER — Ambulatory Visit (INDEPENDENT_AMBULATORY_CARE_PROVIDER_SITE_OTHER): Payer: Medicare Other | Admitting: Family Medicine

## 2017-02-04 ENCOUNTER — Encounter: Payer: Self-pay | Admitting: Family Medicine

## 2017-02-04 VITALS — BP 140/64 | Ht 62.0 in | Wt 204.2 lb

## 2017-02-04 DIAGNOSIS — R319 Hematuria, unspecified: Secondary | ICD-10-CM

## 2017-02-04 LAB — POCT URINALYSIS DIPSTICK
Bilirubin, UA: NEGATIVE
GLUCOSE UA: NEGATIVE
Leukocytes, UA: NEGATIVE
PH UA: 6 (ref 5.0–8.0)
Protein, UA: NEGATIVE
UROBILINOGEN UA: 0.2 U/dL

## 2017-02-04 NOTE — Progress Notes (Signed)
   Subjective:    Patient ID: Shelly Jackson, female    DOB: June 18, 1934, 82 y.o.   MRN: [id number]  HPI Patient in today for a 1 week check up on hematuria. Patient states no other concerns this visit.  Patient denies any visible hematuria denies dysuria flank pains has never had this problem before PMH benign has history diabetes does not smoke Results for orders placed or performed in visit on 02/04/17  POCT urinalysis dipstick  Result Value Ref Range   Color, UA yellow    Clarity, UA Clear    Glucose, UA Negative    Bilirubin, UA Negative    Ketones, UA     Spec Grav, UA  1.010 - 1.025   Blood, UA Large    pH, UA 6.0 5.0 - 8.0   Protein, UA negative    Urobilinogen, UA 0.2 0.2 or 1.0 E.U./dL   Nitrite, UA     Leukocytes, UA Negative Negative    Review of Systems  Constitutional: Negative for activity change and appetite change.  HENT: Negative for congestion.   Respiratory: Negative for cough.   Cardiovascular: Negative for chest pain.  Gastrointestinal: Negative for abdominal pain and vomiting.  Genitourinary: Negative for dysuria, flank pain and hematuria.  Skin: Negative for color change.  Neurological: Negative for weakness.  Psychiatric/Behavioral: Negative for confusion.       Objective:   Physical Exam  Constitutional: She appears well-developed and well-nourished. No distress.  HENT:  Head: Normocephalic and atraumatic.  Eyes: Right eye exhibits no discharge. Left eye exhibits no discharge.  Neck: No tracheal deviation present.  Cardiovascular: Normal rate, regular rhythm and normal heart sounds.   No murmur heard. Pulmonary/Chest: Effort normal and breath sounds normal. No respiratory distress. She has no wheezes. She has no rales.  Musculoskeletal: She exhibits no edema.  Lymphadenopathy:    She has no cervical adenopathy.  Neurological: She is alert. She exhibits normal muscle tone.  Skin: Skin is warm and dry. No erythema.  Psychiatric: Her behavior  is normal.  Vitals reviewed.         Assessment & Plan:  Hematuria under marked prescription 10-15 RBCs Rationale discussed regarding testing patient consents Chest x-ray [facility] referral Keep regular scheduled follow-ups here follow-up sooner if any problems

## 2017-02-08 ENCOUNTER — Encounter: Payer: Self-pay | Admitting: Family Medicine

## 2017-02-14 ENCOUNTER — Ambulatory Visit (HOSPITAL_COMMUNITY)
Admission: RE | Admit: 2017-02-14 | Discharge: 2017-02-14 | Disposition: A | Discharge status: Home / Self Care | Payer: Medicare Other | Source: Ambulatory Visit | Attending: Family Medicine | Admitting: Family Medicine

## 2017-02-14 DIAGNOSIS — N281 Cyst of kidney, acquired: Secondary | ICD-10-CM | POA: Insufficient documentation

## 2017-02-14 DIAGNOSIS — K5730 Diverticulosis of large intestine without perforation or abscess without bleeding: Secondary | ICD-10-CM | POA: Diagnosis not present

## 2017-02-14 DIAGNOSIS — N200 Calculus of kidney: Secondary | ICD-10-CM | POA: Diagnosis not present

## 2017-02-14 DIAGNOSIS — I700 Atherosclerosis of aorta: Secondary | ICD-10-CM | POA: Insufficient documentation

## 2017-02-14 DIAGNOSIS — R319 Hematuria, unspecified: Secondary | ICD-10-CM | POA: Diagnosis present

## 2017-02-14 DIAGNOSIS — D259 Leiomyoma of uterus, unspecified: Secondary | ICD-10-CM | POA: Diagnosis not present

## 2017-02-14 LAB — POCT I-STAT CREATININE: Creatinine, Ser: 0.8 mg/dL (ref 0.44–1.00)

## 2017-02-14 MED ORDER — IOPAMIDOL (ISOVUE-300) INJECTION 61%
125.0000 mL | Freq: Once | INTRAVENOUS | Status: AC | PRN
  Administered 2017-02-14: 125 mL via INTRAVENOUS

## 2017-02-15 ENCOUNTER — Ambulatory Visit (HOSPITAL_COMMUNITY): Payer: Medicare Other

## 2017-02-18 ENCOUNTER — Encounter: Payer: Self-pay | Admitting: Family Medicine

## 2017-02-18 DIAGNOSIS — R319 Hematuria, unspecified: Secondary | ICD-10-CM | POA: Insufficient documentation

## 2017-02-18 DIAGNOSIS — N200 Calculus of kidney: Secondary | ICD-10-CM | POA: Insufficient documentation

## 2017-02-18 DIAGNOSIS — D3500 Benign neoplasm of unspecified adrenal gland: Secondary | ICD-10-CM | POA: Insufficient documentation

## 2017-02-18 DIAGNOSIS — I700 Atherosclerosis of aorta: Secondary | ICD-10-CM | POA: Insufficient documentation

## 2017-02-25 ENCOUNTER — Other Ambulatory Visit: Payer: Self-pay | Admitting: Family Medicine

## 2017-02-25 ENCOUNTER — Other Ambulatory Visit: Payer: Self-pay | Admitting: *Deleted

## 2017-02-25 MED ORDER — ALPRAZOLAM 0.5 MG PO TABS: ORAL_TABLET | ORAL | 0 refills | Status: DC

## 2017-02-25 NOTE — Telephone Encounter (Signed)
Ok plus five refills

## 2017-02-25 NOTE — Telephone Encounter (Signed)
Sorry just one rx plus one ref, didn't seee at first taking 90 days of anxiety meds

## 2017-02-28 ENCOUNTER — Other Ambulatory Visit: Payer: Self-pay | Admitting: *Deleted

## 2017-02-28 MED ORDER — ALPRAZOLAM 0.5 MG PO TABS: ORAL_TABLET | ORAL | 0 refills | Status: DC

## 2017-03-08 DIAGNOSIS — E113511 Type 2 diabetes mellitus with proliferative diabetic retinopathy with macular edema, right eye: Secondary | ICD-10-CM | POA: Diagnosis not present

## 2017-03-08 DIAGNOSIS — E113512 Type 2 diabetes mellitus with proliferative diabetic retinopathy with macular edema, left eye: Secondary | ICD-10-CM | POA: Diagnosis not present

## 2017-03-08 DIAGNOSIS — H43811 Vitreous degeneration, right eye: Secondary | ICD-10-CM | POA: Diagnosis not present

## 2017-03-14 DIAGNOSIS — R5383 Other fatigue: Secondary | ICD-10-CM | POA: Diagnosis not present

## 2017-03-14 DIAGNOSIS — B349 Viral infection, unspecified: Secondary | ICD-10-CM | POA: Diagnosis not present

## 2017-03-14 DIAGNOSIS — K0889 Other specified disorders of teeth and supporting structures: Secondary | ICD-10-CM | POA: Diagnosis not present

## 2017-03-22 DIAGNOSIS — E113512 Type 2 diabetes mellitus with proliferative diabetic retinopathy with macular edema, left eye: Secondary | ICD-10-CM | POA: Diagnosis not present

## 2017-04-07 ENCOUNTER — Encounter (HOSPITAL_COMMUNITY): Payer: Self-pay | Admitting: Emergency Medicine

## 2017-04-07 ENCOUNTER — Emergency Department (HOSPITAL_COMMUNITY): Payer: Medicare Other

## 2017-04-07 ENCOUNTER — Other Ambulatory Visit: Payer: Self-pay

## 2017-04-07 ENCOUNTER — Emergency Department (HOSPITAL_COMMUNITY)
Admission: EM | Admit: 2017-04-07 | Discharge: 2017-04-07 | Disposition: A | Discharge status: Home / Self Care | Payer: Medicare Other | Attending: Emergency Medicine | Admitting: Emergency Medicine

## 2017-04-07 DIAGNOSIS — N23 Unspecified renal colic: Secondary | ICD-10-CM | POA: Diagnosis not present

## 2017-04-07 DIAGNOSIS — Z794 Long term (current) use of insulin: Secondary | ICD-10-CM | POA: Insufficient documentation

## 2017-04-07 DIAGNOSIS — E1140 Type 2 diabetes mellitus with diabetic neuropathy, unspecified: Secondary | ICD-10-CM | POA: Diagnosis not present

## 2017-04-07 DIAGNOSIS — N202 Calculus of kidney with calculus of ureter: Secondary | ICD-10-CM | POA: Diagnosis not present

## 2017-04-07 DIAGNOSIS — I509 Heart failure, unspecified: Secondary | ICD-10-CM | POA: Diagnosis not present

## 2017-04-07 DIAGNOSIS — Z79899 Other long term (current) drug therapy: Secondary | ICD-10-CM | POA: Diagnosis not present

## 2017-04-07 DIAGNOSIS — Z7982 Long term (current) use of aspirin: Secondary | ICD-10-CM | POA: Insufficient documentation

## 2017-04-07 DIAGNOSIS — N201 Calculus of ureter: Secondary | ICD-10-CM | POA: Insufficient documentation

## 2017-04-07 DIAGNOSIS — R112 Nausea with vomiting, unspecified: Secondary | ICD-10-CM | POA: Insufficient documentation

## 2017-04-07 DIAGNOSIS — K5730 Diverticulosis of large intestine without perforation or abscess without bleeding: Secondary | ICD-10-CM | POA: Diagnosis not present

## 2017-04-07 DIAGNOSIS — I110 Hypertensive heart disease with heart failure: Secondary | ICD-10-CM | POA: Diagnosis not present

## 2017-04-07 DIAGNOSIS — R319 Hematuria, unspecified: Secondary | ICD-10-CM | POA: Diagnosis not present

## 2017-04-07 DIAGNOSIS — R1031 Right lower quadrant pain: Secondary | ICD-10-CM | POA: Diagnosis present

## 2017-04-07 DIAGNOSIS — N132 Hydronephrosis with renal and ureteral calculous obstruction: Secondary | ICD-10-CM | POA: Diagnosis not present

## 2017-04-07 DIAGNOSIS — E11319 Type 2 diabetes mellitus with unspecified diabetic retinopathy without macular edema: Secondary | ICD-10-CM | POA: Insufficient documentation

## 2017-04-07 LAB — CBC WITH DIFFERENTIAL/PLATELET
Basophils Absolute: 0 10*3/uL (ref 0.0–0.1)
Basophils Relative: 0 %
Eosinophils Absolute: 0.1 10*3/uL (ref 0.0–0.7)
Eosinophils Relative: 0 %
HEMATOCRIT: 40.2 % (ref 36.0–46.0)
HEMOGLOBIN: 12.8 g/dL (ref 12.0–15.0)
LYMPHS ABS: 1.4 10*3/uL (ref 0.7–4.0)
LYMPHS PCT: 10 %
MCH: 28.8 pg (ref 26.0–34.0)
MCHC: 31.8 g/dL (ref 30.0–36.0)
MCV: 90.5 fL (ref 78.0–100.0)
Monocytes Absolute: 0.5 10*3/uL (ref 0.1–1.0)
Monocytes Relative: 3 %
Neutro Abs: 11.7 10*3/uL — ABNORMAL HIGH (ref 1.7–7.7)
Neutrophils Relative %: 87 %
Platelets: 183 10*3/uL (ref 150–400)
RBC: 4.44 MIL/uL (ref 3.87–5.11)
RDW: 13.5 % (ref 11.5–15.5)
WBC: 13.7 10*3/uL — AB (ref 4.0–10.5)

## 2017-04-07 LAB — URINALYSIS, ROUTINE W REFLEX MICROSCOPIC
BACTERIA UA: NONE SEEN
Bilirubin Urine: NEGATIVE
GLUCOSE, UA: NEGATIVE mg/dL
KETONES UR: 5 mg/dL — AB
Leukocytes, UA: NEGATIVE
Nitrite: NEGATIVE
PROTEIN: 30 mg/dL — AB
Specific Gravity, Urine: 1.018 (ref 1.005–1.030)
pH: 5 (ref 5.0–8.0)

## 2017-04-07 LAB — COMPREHENSIVE METABOLIC PANEL
ALBUMIN: 3.8 g/dL (ref 3.5–5.0)
ALT: 19 U/L (ref 14–54)
ANION GAP: 11 (ref 5–15)
AST: 25 U/L (ref 15–41)
Alkaline Phosphatase: 61 U/L (ref 38–126)
BUN: 24 mg/dL — AB (ref 6–20)
CHLORIDE: 101 mmol/L (ref 101–111)
CO2: 24 mmol/L (ref 22–32)
Calcium: 8.5 mg/dL — ABNORMAL LOW (ref 8.9–10.3)
Creatinine, Ser: 0.95 mg/dL (ref 0.44–1.00)
GFR calc Af Amer: >60 mL/min (ref >60)
GFR calc non Af Amer: 55 mL/min — ABNORMAL LOW (ref >60)
GLUCOSE: 119 mg/dL — AB (ref 65–99)
POTASSIUM: 3.7 mmol/L (ref 3.5–5.1)
Sodium: 136 mmol/L (ref 135–145)
Total Bilirubin: 0.7 mg/dL (ref 0.3–1.2)
Total Protein: 6.9 g/dL (ref 6.5–8.1)

## 2017-04-07 LAB — LIPASE, BLOOD: LIPASE: 19 U/L (ref 11–51)

## 2017-04-07 MED ORDER — OXYCODONE-ACETAMINOPHEN 5-325 MG PO TABS: 1.0000 | ORAL_TABLET | Freq: Three times a day (TID) | ORAL | 0 refills | Status: DC | PRN

## 2017-04-07 MED ORDER — ONDANSETRON 8 MG PO TBDP
8.0000 mg | ORAL_TABLET | Freq: Once | ORAL | Status: AC
  Administered 2017-04-07: 8 mg via ORAL
  Filled 2017-04-07: qty 1

## 2017-04-07 MED ORDER — OXYCODONE-ACETAMINOPHEN 5-325 MG PO TABS: ORAL_TABLET | ORAL | 0 refills | Status: DC

## 2017-04-07 MED ORDER — OXYCODONE-ACETAMINOPHEN 5-325 MG PO TABS
1.0000 | ORAL_TABLET | Freq: Once | ORAL | Status: AC
  Administered 2017-04-07: 1 via ORAL
  Filled 2017-04-07: qty 1

## 2017-04-07 MED ORDER — ONDANSETRON 4 MG PO TBDP: 4.0000 mg | ORAL_TABLET | Freq: Three times a day (TID) | ORAL | 0 refills | Status: DC | PRN

## 2017-04-07 MED ORDER — MORPHINE SULFATE (PF) 4 MG/ML IV SOLN
2.0000 mg | Freq: Once | INTRAVENOUS | Status: AC
  Administered 2017-04-07: 2 mg via INTRAMUSCULAR
  Filled 2017-04-07: qty 1

## 2017-04-07 NOTE — ED Triage Notes (Signed)
rlq pain and nausea  since this morning.  Pt has hx of kidney stones

## 2017-04-07 NOTE — ED Provider Notes (Signed)
[facility] EMERGENCY DEPARTMENT Provider Note   CSN: [id number] Arrival date & time: 04/07/17  1521     History   Chief Complaint Chief Complaint  Patient presents with  . Abdominal Pain    HPI Shelly Jackson is a 81 y.o. female.  HPI  Pt was seen at 1540. Per pt, c/o sudden onset and persistence of waxing and waning right sided flank "pain" that began this morning.  Pt describes the pain as "like my last kidney stone," and radiating into the right side of her abd.  Has been associated with multiple intermittent episodes of N/V and hematuria.  Denies dysuria, no abd pain, no diarrhea, no black or blood in emesis, no CP/SOB, no fevers, no rash.    Past Medical History:  Diagnosis Date  . Acid reflux   . Anemia    Secondary to acute blood loss  . CHF (congestive heart failure) ([place])   . Diabetes mellitus   . Diabetic retinopathy ([place])   . Diverticulitis 07/24/11   Inpatient [facility]  . Hypertension   . Obesity   . Rheumatoid arthritis(714.0)   . Sleep apnea   . Thrombocytopenia ([place])   . Tubular adenoma of colon 07/24/11    Patient Active Problem List   Diagnosis Date Noted  . Adrenal adenoma 02/18/2017  . Aortic atherosclerosis ([place]) 02/18/2017  . Kidney stones 02/18/2017  . Hematuria 02/18/2017  . Diabetic retinopathy ([place]) 12/24/2014  . Diabetic peripheral neuropathy ([place]) 04/25/2014  . HTN (hypertension), benign 04/10/2013  . Hyperlipemia, mixed 11/28/2012  . Thrombocytopenia ([place]) 07/26/2011  . Diverticulitis 07/24/2011  . Polyp of colon 07/24/2011  . Lower gastrointestinal bleed 07/23/2011  . Acute blood loss anemia 07/23/2011  . Sinus bradycardia 07/23/2011  . Obesity 07/23/2011  . DM type 2 (diabetes mellitus, type 2) ([place]) 07/23/2011    Past Surgical History:  Procedure Laterality Date  . APPENDECTOMY    . CATARACT EXTRACTION Bilateral (right 08/2014) ( left 2005)  . CHOLECYSTECTOMY    . COLONOSCOPY  07/24/2011   Dr. Deatra Ina MM polyp at proximal  ascending colon could not be removed because of difficult approach, pancolonic diverticulosis with diverticulitis involving the ascending colon, and suspected diverticular bleed, 7 mm polyp snared from proximal sigmoid colon with hemorrhagic surface found to be a tubular adenoma, small external hemorrhoids  . COLONOSCOPY  08/2007   Dr. Malva Limes hyperplastic polyp  . COLONOSCOPY N/A 12/14/2012   Procedure: COLONOSCOPY;  Surgeon: Daneil Dolin, MD;  Location: [facility];  Service: Endoscopy;  Laterality: N/A;  8:30  . TONSILLECTOMY      OB History    No data available       Home Medications    Prior to Admission medications   Medication Sig Start Date End Date Taking? Authorizing Provider  ALPRAZolam Duanne Moron) 0.5 MG tablet TAKE 1 TABLET AT BEDTIME AS NEEDED  FOR  SLEEP 02/28/17   Kathyrn Drown, MD  aspirin 81 MG tablet Take 81 mg by mouth daily.    [provider]  atorvastatin (LIPITOR) 20 MG tablet Take 1 tablet (20 mg total) by mouth daily. 12/28/16   Kathyrn Drown, MD  citalopram (CELEXA) 20 MG tablet Take 1 tablet (20 mg total) by mouth daily. 12/28/16   Kathyrn Drown, MD  fish oil-omega-3 fatty acids 1000 MG capsule Take 1 g by mouth daily.    [provider]  Insulin Glargine (LANTUS SOLOSTAR) 100 UNIT/ML Solostar Pen INJECT SUBCUTANEOUSLY 48  UNITS AT  BEDTIME 01/21/17   Kathyrn Drown, MD  Insulin Syringes, Disposable, U-100 1 ML MISC Use to administer insulin 08/25/15   Luking, Elayne Snare, MD  losartan-hydrochlorothiazide (HYZAAR) 50-12.5 MG tablet TAKE 1 TABLET DAILY. (STOP BENICAR) 12/28/16   Kathyrn Drown, MD  metFORMIN (GLUCOPHAGE) 1000 MG tablet TAKE 1 TABLET IN THE  MORNING AND TAKE 1/2 TABLET AT NIGHT 12/28/16   Luking, Scott A, MD  mometasone (ELOCON) 0.1 % cream Apply 1 application topically 2 (two) times daily. As needed 12/28/16   Kathyrn Drown, MD  Multiple Vitamin (MULTIVITAMIN) tablet Take 1 tablet by mouth daily.    [provider]    NIFEdipine (PROCARDIA XL/ADALAT-CC) 60 MG 24 hr tablet Take 1 tablet (60 mg total) by mouth daily. 12/28/16   Kathyrn Drown, MD  OVER THE COUNTER MEDICATION Vit E    [provider]  oxybutynin (DITROPAN-XL) 5 MG 24 hr tablet Take 1 tablet (5 mg total) by mouth daily. 12/28/16   Kathyrn Drown, MD  POTASSIUM PO Take by mouth. OTC    [provider]  ranitidine (ZANTAC) 150 MG tablet Take 150 mg by mouth as needed for heartburn.    [provider]  triamcinolone cream (KENALOG) 0.1 % Apply thin amount twice a day as needed to help with itching around the umbilicus 4/69/62   Luking, Scott A, MD  vitamin C (ASCORBIC ACID) 500 MG tablet Take 500 mg by mouth daily.    [provider]    Family History Family History  Problem Relation Age of Onset  . Coronary artery disease Father   . Heart attack Father   . Cirrhosis Mother   . Hypertension Mother   . Colon cancer Neg Hx     Social History Social History   Tobacco Use  . Smoking status: Never Smoker  . Smokeless tobacco: Never Used  . Tobacco comment: Never  Substance Use Topics  . Alcohol use: No  . Drug use: No     Allergies   Daypro [oxaprozin]   Review of Systems Review of Systems ROS: Statement: All systems negative except as marked or noted in the HPI; Constitutional: Negative for fever and chills. ; ; Eyes: Negative for eye pain, redness and discharge. ; ; ENMT: Negative for ear pain, hoarseness, nasal congestion, sinus pressure and sore throat. ; ; Cardiovascular: Negative for chest pain, palpitations, diaphoresis, dyspnea and peripheral edema. ; ; Respiratory: Negative for cough, wheezing and stridor. ; ; Gastrointestinal: +N/V. Negative for diarrhea, abdominal pain, blood in stool, hematemesis, jaundice and rectal bleeding. . ; ; Genitourinary: Negative for dysuria. +flank pain and hematuria. ; ; Musculoskeletal: Negative for back pain and neck pain. Negative for swelling and trauma.;  ; Skin: Negative for pruritus, rash, abrasions, blisters, bruising and skin lesion.; ; Neuro: Negative for headache, lightheadedness and neck stiffness. Negative for weakness, altered level of consciousness, altered mental status, extremity weakness, paresthesias, involuntary movement, seizure and syncope.       Physical Exam Updated Vital Signs Pulse 99   Resp (!) 22   SpO2 100%   Physical Exam 1545: Physical examination:  Nursing notes reviewed; Vital signs and O2 SAT reviewed;  Constitutional: Well developed, Well nourished, Well hydrated, Uncomfortable appearing.; Head:  Normocephalic, atraumatic; Eyes: EOMI, PERRL, No scleral icterus; ENMT: Mouth and pharynx normal, Mucous membranes moist; Neck: Supple, Full range of motion, No lymphadenopathy; Cardiovascular: Regular rate and rhythm, No gallop; Respiratory: Breath sounds clear & equal bilaterally, No wheezes.  Speaking full sentences with ease, Normal respiratory effort/excursion; Chest: Nontender, Movement normal; Abdomen: Soft, Nontender, Nondistended, Normal bowel sounds; Genitourinary: No CVA tenderness; Spine:  No midline CS, TS, LS tenderness.;; Extremities: Pulses normal, No tenderness, No edema, No calf edema or asymmetry.; Neuro: AA&Ox3, Major CN grossly intact.  Speech clear. No gross focal motor or sensory deficits in extremities.; Skin: Color normal, Warm, Dry.   ED Treatments / Results  Labs (all labs ordered are listed, but only abnormal results are displayed)   EKG  EKG Interpretation None       Radiology   Procedures Procedures (including critical care time)  Medications Ordered in ED Medications  ondansetron (ZOFRAN-ODT) disintegrating tablet 8 mg (8 mg Oral Given 04/07/17 1614)  morphine 4 MG/ML injection 2 mg (2 mg Intramuscular Given 04/07/17 1614)     Initial Impression / Assessment and Plan / ED Course  I have reviewed the triage vital signs and the nursing notes.  Pertinent labs & imaging  results that were available during my care of the patient were reviewed by me and considered in my medical decision making (see chart for details).  MDM Reviewed: previous chart, nursing note and vitals Reviewed previous: labs and CT scan Interpretation: labs and CT scan    Results for orders placed or performed during the hospital encounter of 04/07/17  Comprehensive metabolic panel  Result Value Ref Range   Sodium 136 135 - 145 mmol/L   Potassium 3.7 3.5 - 5.1 mmol/L   Chloride 101 101 - 111 mmol/L   CO2 24 22 - 32 mmol/L   Glucose, Bld 119 (H) 65 - 99 mg/dL   BUN 24 (H) 6 - 20 mg/dL   Creatinine, Ser 0.95 0.44 - 1.00 mg/dL   Calcium 8.5 (L) 8.9 - 10.3 mg/dL   Total Protein 6.9 6.5 - 8.1 g/dL   Albumin 3.8 3.5 - 5.0 g/dL   AST 25 15 - 41 U/L   ALT 19 14 - 54 U/L   Alkaline Phosphatase 61 38 - 126 U/L   Total Bilirubin 0.7 0.3 - 1.2 mg/dL   GFR calc non Af Amer 55 (L) >60 mL/min   GFR calc Af Amer >60 >60 mL/min   Anion gap 11 5 - 15  Lipase, blood  Result Value Ref Range   Lipase 19 11 - 51 U/L  CBC with Differential  Result Value Ref Range   WBC 13.7 (H) 4.0 - 10.5 K/uL   RBC 4.44 3.87 - 5.11 MIL/uL   Hemoglobin 12.8 12.0 - 15.0 g/dL   HCT 40.2 36.0 - 46.0 %   MCV 90.5 78.0 - 100.0 fL   MCH 28.8 26.0 - 34.0 pg   MCHC 31.8 30.0 - 36.0 g/dL   RDW 13.5 11.5 - 15.5 %   Platelets 183 150 - 400 K/uL   Neutrophils Relative % 87 %   Neutro Abs 11.7 (H) 1.7 - 7.7 K/uL   Lymphocytes Relative 10 %   Lymphs Abs 1.4 0.7 - 4.0 K/uL   Monocytes Relative 3 %   Monocytes Absolute 0.5 0.1 - 1.0 K/uL   Eosinophils Relative 0 %   Eosinophils Absolute 0.1 0.0 - 0.7 K/uL   Basophils Relative 0 %   Basophils Absolute 0.0 0.0 - 0.1 K/uL  Urinalysis, Routine w reflex microscopic  Result Value Ref Range   Color, Urine YELLOW YELLOW   APPearance CLEAR CLEAR   Specific Gravity, Urine 1.018 1.005 - 1.030   pH 5.0 5.0 - 8.0  Glucose, UA NEGATIVE NEGATIVE mg/dL   Hgb urine dipstick  SMALL (A) NEGATIVE   Bilirubin Urine NEGATIVE NEGATIVE   Ketones, ur 5 (A) NEGATIVE mg/dL   Protein, ur 30 (A) NEGATIVE mg/dL   Nitrite NEGATIVE NEGATIVE   Leukocytes, UA NEGATIVE NEGATIVE   RBC / HPF 0-5 0 - 5 RBC/hpf   WBC, UA 0-5 0 - 5 WBC/hpf   Bacteria, UA NONE SEEN NONE SEEN   Squamous Epithelial / LPF 0-5 (A) NONE SEEN   Mucus PRESENT    Ct Renal Stone Study Result Date: 04/07/2017 CLINICAL DATA:  Flank pain. Right lower quadrant pain and nausea intermittently for 1 month, worse today. History of kidney stones. EXAM: CT ABDOMEN AND PELVIS WITHOUT CONTRAST TECHNIQUE: Multidetector CT imaging of the abdomen and pelvis was performed following the standard protocol without IV contrast. COMPARISON:  02/14/2017 FINDINGS: Lower chest: Mild atelectasis in the lung bases, partially obscuring the 6 mm subpleural posterior left lower lobe nodule on the prior study. No pleural effusion. Hepatobiliary: No focal liver abnormality is seen. Status post cholecystectomy. No biliary dilatation. Pancreas: Unremarkable. Spleen: Unremarkable. Adrenals/Urinary Tract: Unchanged 1.8 cm left adrenal nodule consistent with an adenoma. Unchanged mild nodular right adrenal gland thickening. A 5 mm interpolar to lower pole renal stone on the right has migrated into the distal ureter in the interim and is resulting in mild-to-moderate hydronephrosis and mild hydroureter. A few additional punctate nonobstructing calculi remain in the lower pole of the right kidney. There is new mild-to-moderate right perinephric stranding. No left renal calculi or hydronephrosis is seen. Multiple hyperattenuating and hypoattenuating lesions are again seen in both kidneys, more fully evaluated on the prior contrast-enhanced study and felt to represent cysts of varying complete complexity. The largest measures in the right lower pole. The bladder is unremarkable. Stomach/Bowel: There is no evidence of bowel obstruction. Extensive sigmoid colon  diverticulosis is noted without evidence of diverticulitis. The appendix is not identified, consistent with history of appendectomy. Vascular/Lymphatic: Abdominal aortic atherosclerosis without aneurysm. No enlarged lymph nodes. Reproductive: Unchanged calcified lesions in the uterus consistent with fibroids. No adnexal mass. Other: No intraperitoneal free fluid. No abdominal wall mass or hernia. Musculoskeletal: Partially visualized lipoma in the anterior right thigh. Advanced disc degeneration throughout the lumbar and visualized lower thoracic spine. IMPRESSION: 1. 5 mm obstructing distal right ureteral stone resulting in mild to moderate hydronephrosis. 2. Nonobstructing right nephrolithiasis. 3. Bilateral renal lesions more fully characterized on the prior contrast-enhanced CT. 4. Uterine fibroids. 5. Colonic diverticulosis. 6.  Aortic Atherosclerosis (ICD10-I70.0). Electronically Signed   By: Logan Bores M.D.   On: 04/07/2017 17:16     2040:  Pt with known renal lesions. Pt has tol PO well while in the ED without N/V.  Abd remains benign, VSS. Pt states she feels better and wants to go home now. No UTI on Udip and Cr normal; tx symptomatically at this time, f/u Uro MD. Dx and testing d/w pt and family.  Questions answered.  Verb understanding, agreeable to d/c home with outpt f/u.     Final Clinical Impressions(s) / ED Diagnoses   Final diagnoses:  None    ED Discharge Orders    None       Francine Graven, DO 04/11/17 2343

## 2017-04-07 NOTE — ED Notes (Signed)
Patient was taken to restroom and failed to get a urine specimen.  We advised patient next time she needed to use restroom, we must obtain urine specimen.

## 2017-04-07 NOTE — Discharge Instructions (Signed)
Take the prescriptions as directed.  Call your regular medical doctor tomorrow to schedule a follow up appointment within the next 3 days. Call the Urologist tomorrow to schedule a follow up appointment within the next week.  Return to the Emergency Department immediately if worsening. ° °

## 2017-04-09 LAB — URINE CULTURE: Culture: NO GROWTH

## 2017-04-11 ENCOUNTER — Telehealth: Payer: Self-pay | Admitting: Family Medicine

## 2017-04-11 MED ORDER — ONDANSETRON 4 MG PO TBDP: 4.0000 mg | ORAL_TABLET | Freq: Three times a day (TID) | ORAL | 3 refills | Status: DC | PRN

## 2017-04-11 MED FILL — Oxycodone w/ Acetaminophen Tab 5-325 MG: ORAL | Qty: 6 | Status: AC

## 2017-04-11 NOTE — Telephone Encounter (Signed)
Patient was seen at the ER on 18/84/16 for ureteral colic.  She is out of the zofran she was prescribed and would like to know if Dr. Nicki Reaper can refill this for her.  [place], [place]

## 2017-04-11 NOTE — Telephone Encounter (Signed)
Please advise thanks.

## 2017-04-11 NOTE — Telephone Encounter (Signed)
Pt is aware rx are sent to pharmacy.

## 2017-04-11 NOTE — Telephone Encounter (Signed)
May refill of Zofran x3

## 2017-04-20 ENCOUNTER — Ambulatory Visit (INDEPENDENT_AMBULATORY_CARE_PROVIDER_SITE_OTHER): Payer: Medicare Other | Admitting: Urology

## 2017-04-20 ENCOUNTER — Ambulatory Visit: Payer: Medicare Other | Admitting: Urology

## 2017-04-20 DIAGNOSIS — N201 Calculus of ureter: Secondary | ICD-10-CM | POA: Diagnosis not present

## 2017-05-11 ENCOUNTER — Other Ambulatory Visit: Payer: Self-pay | Admitting: Family Medicine

## 2017-05-11 ENCOUNTER — Ambulatory Visit (HOSPITAL_COMMUNITY)
Admission: RE | Admit: 2017-05-11 | Discharge: 2017-05-11 | Disposition: A | Discharge status: Home / Self Care | Payer: Medicare Other | Source: Ambulatory Visit | Attending: Family Medicine | Admitting: Family Medicine

## 2017-05-11 ENCOUNTER — Ambulatory Visit (INDEPENDENT_AMBULATORY_CARE_PROVIDER_SITE_OTHER): Payer: Medicare Other | Admitting: Urology

## 2017-05-11 DIAGNOSIS — R109 Unspecified abdominal pain: Secondary | ICD-10-CM | POA: Diagnosis not present

## 2017-05-11 DIAGNOSIS — N201 Calculus of ureter: Secondary | ICD-10-CM

## 2017-05-11 DIAGNOSIS — K6389 Other specified diseases of intestine: Secondary | ICD-10-CM | POA: Insufficient documentation

## 2017-05-11 NOTE — Telephone Encounter (Signed)
Last seen 01/2017 for hematuria.

## 2017-05-11 NOTE — Telephone Encounter (Signed)
Last seen for hematuria 01/2017

## 2017-06-07 ENCOUNTER — Other Ambulatory Visit: Payer: Self-pay | Admitting: *Deleted

## 2017-06-08 ENCOUNTER — Ambulatory Visit (INDEPENDENT_AMBULATORY_CARE_PROVIDER_SITE_OTHER): Payer: Medicare Other | Admitting: Urology

## 2017-06-08 ENCOUNTER — Other Ambulatory Visit: Payer: Self-pay | Admitting: *Deleted

## 2017-06-08 DIAGNOSIS — N201 Calculus of ureter: Secondary | ICD-10-CM

## 2017-06-08 MED ORDER — ALPRAZOLAM 0.5 MG PO TABS: ORAL_TABLET | ORAL | 1 refills | Status: DC

## 2017-06-08 NOTE — Telephone Encounter (Signed)
May have this and 1 refill 

## 2017-06-15 ENCOUNTER — Other Ambulatory Visit: Payer: Self-pay | Admitting: Family Medicine

## 2017-06-16 ENCOUNTER — Ambulatory Visit (INDEPENDENT_AMBULATORY_CARE_PROVIDER_SITE_OTHER): Payer: Medicare Other | Admitting: Family Medicine

## 2017-06-16 ENCOUNTER — Encounter: Payer: Self-pay | Admitting: Family Medicine

## 2017-06-16 DIAGNOSIS — E782 Mixed hyperlipidemia: Secondary | ICD-10-CM

## 2017-06-16 DIAGNOSIS — Z23 Encounter for immunization: Secondary | ICD-10-CM | POA: Diagnosis not present

## 2017-06-16 DIAGNOSIS — E1142 Type 2 diabetes mellitus with diabetic polyneuropathy: Secondary | ICD-10-CM | POA: Diagnosis not present

## 2017-06-16 DIAGNOSIS — I10 Essential (primary) hypertension: Secondary | ICD-10-CM | POA: Diagnosis not present

## 2017-06-16 DIAGNOSIS — Z79899 Other long term (current) drug therapy: Secondary | ICD-10-CM | POA: Diagnosis not present

## 2017-06-16 MED ORDER — ALPRAZOLAM 0.5 MG PO TABS: ORAL_TABLET | ORAL | 1 refills | Status: DC

## 2017-06-16 MED ORDER — LOSARTAN POTASSIUM-HCTZ 50-12.5 MG PO TABS: ORAL_TABLET | ORAL | 1 refills | Status: DC

## 2017-06-16 NOTE — Progress Notes (Signed)
   Subjective:    Patient ID: Shelly Jackson, female    DOB: 1934/12/04, 83 y.o.   MRN: [id number]  HPI surgical clearance to have kidney stones removed. Sees Dr. Alyson Ingles at [facility] urology specialists.  This patient has multiple kidney stones they are going to be doing a procedure where they go up through the ureter to do laser destruction of the stone the patient is not having to have any type of surgical issue in regards to cutting she denies any chest tightness pressure pain shortness of breath with activity Needs refills on alprazolam and losartan/hctz.   Needs new cpap. Wants it sent to [facility] in [place], [place]. Phone number [phone].  Patient states that she has a hard time getting tubing she needs a referral for this she does use her CPAP machine on a regular basis   Review of Systems  Constitutional: Negative for activity change, appetite change and fatigue.  HENT: Negative for congestion.   Respiratory: Negative for cough.   Cardiovascular: Negative for chest pain.  Gastrointestinal: Negative for abdominal pain.  Endocrine: Negative for polydipsia and polyphagia.  Skin: Negative for color change.  Neurological: Negative for weakness.  Psychiatric/Behavioral: Negative for confusion.       Objective:   Physical Exam  Constitutional: She appears well-developed and well-nourished. No distress.  HENT:  Head: Normocephalic and atraumatic.  Eyes: Right eye exhibits no discharge. Left eye exhibits no discharge.  Neck: No tracheal deviation present.  Cardiovascular: Normal rate, regular rhythm and normal heart sounds.  No murmur heard. Pulmonary/Chest: Effort normal and breath sounds normal. No respiratory distress. She has no wheezes. She has no rales.  Musculoskeletal: She exhibits no edema.  Lymphadenopathy:    She has no cervical adenopathy.  Neurological: She is alert. She exhibits normal muscle tone.  Skin: Skin is warm and dry. No erythema.    Psychiatric: Her behavior is normal.  Vitals reviewed.         Assessment & Plan:  The patient is surgically cleared to have this procedure done There is no need for removing her aspirin for this type of procedure If the surgeon feels that there is going to be significant the cutting of tissue then she may stop her aspirin 5 days before the procedure otherwise she may stay on her aspirin  Also we will send referral for tubing for her CPAP  Blood pressure diabetes stable

## 2017-06-21 ENCOUNTER — Telehealth: Payer: Self-pay | Admitting: Family Medicine

## 2017-06-21 ENCOUNTER — Other Ambulatory Visit: Payer: Self-pay | Admitting: Urology

## 2017-06-21 NOTE — Telephone Encounter (Signed)
[facility] for pt, need to clarify if she needs CPAP supplies or new machine with supplies  (this will determine what documentation will need to be sent with order)  Pt requested [facility] in [facility] Ph# [phone], Fx# [phone]    See note below from Dr. Nicki Reaper From: Kathyrn Drown, MD  Sent: 06/16/2017 12:26 PM  To: [facility] Clinical Pool   Nurses please do a written out a prescription for CPAP to being to be sent to [facility] in [place]

## 2017-06-21 NOTE — Patient Instructions (Signed)
Shelly Jackson  06/21/2017     @PREFPERIOPPHARMACY @   Your procedure is scheduled on  06/27/2017   Report to [facility] at  830   A.M.  Call this number if you have problems the morning of surgery:  [phone]   Remember:  Do not eat food or drink liquids after midnight.  Take these medicines the morning of surgery with A SIP OF WATER  Celexa, procardia, zofran, ditropan, oxycodone, zantac.   Do not wear jewelry, make-up or nail polish.  Do not wear lotions, powders, or perfumes, or deodorant.  Do not shave 48 hours prior to surgery.  Men may shave face and neck.  Do not bring valuables to the hospital.  [facility] is not responsible for any belongings or valuables.  Contacts, dentures or bridgework may not be worn into surgery.  Leave your suitcase in the car.  After surgery it may be brought to your room.  For patients admitted to the hospital, discharge time will be determined by your treatment team.  Patients discharged the day of surgery will not be allowed to drive home.   Name and phone number of your driver:   family Special instructions:  None  Please read over the following fact sheets that you were given. Anesthesia Post-op Instructions and Care and Recovery After Surgery       Lithotripsy Lithotripsy is a treatment that can sometimes help eliminate kidney stones and the pain that they cause. A form of lithotripsy, also known as extracorporeal shock wave lithotripsy, is a nonsurgical procedure that crushes a kidney stone with shock waves. These shock waves pass through your body and focus on the kidney stone. They cause the kidney stone to break up while it is still in the urinary tract. This makes it easier for the smaller pieces of stone to pass in the urine. Tell a health care provider about:  Any allergies you have.  All medicines you are taking, including vitamins, herbs, eye drops, creams, and over-the-counter  medicines.  Any blood disorders you have.  Any surgeries you have had.  Any medical conditions you have.  Whether you are pregnant or may be pregnant.  Any problems you or family members have had with anesthetic medicines. What are the risks? Generally, this is a safe procedure. However, problems may occur, including:  Infection.  Bleeding of the kidney.  Bruising of the kidney or skin.  Scarring of the kidney, which can lead to: ? Increased blood pressure. ? Poor kidney function. ? Return (recurrence) of kidney stones.  Damage to other structures or organs, such as the liver, colon, spleen, or pancreas.  Blockage (obstruction) of the the tube that carries urine from the kidney to the bladder (ureter).  Failure of the kidney stone to break into pieces (fragments).  What happens before the procedure? Staying hydrated Follow instructions from your health care provider about hydration, which may include:  Up to 2 hours before the procedure - you may continue to drink clear liquids, such as water, clear fruit juice, black coffee, and plain tea.  Eating and drinking restrictions Follow instructions from your health care provider about eating and drinking, which may include:  8 hours before the procedure - stop eating heavy meals or foods such as meat, fried foods, or fatty foods.  6 hours before the procedure - stop eating light meals or foods, such as  toast or cereal.  6 hours before the procedure - stop drinking milk or drinks that contain milk.  2 hours before the procedure - stop drinking clear liquids.  General instructions  Plan to have someone take you home from the hospital or clinic.  Ask your health care provider about: ? Changing or stopping your regular medicines. This is especially important if you are taking diabetes medicines or blood thinners. ? Taking medicines such as aspirin and ibuprofen. These medicines and other NSAIDs can thin your blood. Do not  take these medicines for 7 days before your procedure if your health care provider instructs you not to.  You may have tests, such as: ? Blood tests. ? Urine tests. ? Imaging tests, such as a CT scan. What happens during the procedure?  To lower your risk of infection: ? Your health care team will wash or sanitize their hands. ? Your skin will be washed with soap.  An IV tube will be inserted into one of your veins. This tube will give you fluids and medicines.  You will be given one or more of the following: ? A medicine to help you relax (sedative). ? A medicine to make you fall asleep (general anesthetic).  A water-filled cushion may be placed behind your kidney or on your abdomen. In some cases you may be placed in a tub of lukewarm water.  Your body will be positioned in a way that makes it easy to target the kidney stone.  A flexible tube with holes in it (stent) may be placed in the ureter. This will help keep urine flowing from the kidney if the fragments of the stone have been blocking the ureter.  An X-ray or ultrasound exam will be done to locate your stone.  Shock waves will be aimed at the stone. If you are awake, you may feel a tapping sensation as the shock waves pass through your body. The procedure may vary among health care providers and hospitals. What happens after the procedure?  You may have an X-ray to see whether the procedure was able to break up the kidney stone and how much of the stone has passed. If large stone fragments remain after treatment, you may need to have a second procedure at a later time.  Your blood pressure, heart rate, breathing rate, and blood oxygen level will be monitored until the medicines you were given have worn off.  You may be given antibiotics or pain medicine as needed.  If a stent was placed in your ureter during surgery, it may stay in place for a few weeks.  You may need strain your urine to collect pieces of the kidney  stone for testing.  You will need to drink plenty of water.  Do not drive for 24 hours if you were given a sedative. Summary  Lithotripsy is a treatment that can sometimes help eliminate kidney stones and the pain that they cause.  A form of lithotripsy, also known as extracorporeal shock wave lithotripsy, is a nonsurgical procedure that crushes a kidney stone with shock waves.  Generally, this is a safe procedure. However, problems may occur, including damage to the kidney or other organs, infection, or obstruction of the tube that carries urine from the kidney to the bladder (ureter).  When you go home, you will need to drink plenty of water. You may be asked to strain your urine to collect pieces of the kidney stone for testing. This information is not intended to  replace advice given to you by your health care provider. Make sure you discuss any questions you have with your health care provider. Document Released: 04/23/2000 Document Revised: 03/17/2016 Document Reviewed: 03/17/2016 Elsevier Interactive Patient Education  2018 [place] After This sheet gives you information about how to care for yourself after your procedure. Your health care provider may also give you more specific instructions. If you have problems or questions, contact your health care provider. What can I expect after the procedure? After the procedure, it is common to have:  Some blood in your urine. This should only last for a few days.  Soreness in your back, sides, or upper abdomen for a few days.  Blotches or bruises on your back where the pressure wave entered the skin.  Pain, discomfort, or nausea when pieces (fragments) of the kidney stone move through the tube that carries urine from the kidney to the bladder (ureter). Stone fragments may pass soon after the procedure, but they may continue to pass for up to 4-8 weeks. ? If you have severe pain or nausea, contact your health  care provider. This may be caused by a large stone that was not broken up, and this may mean that you need more treatment.  Some pain or discomfort during urination.  Some pain or discomfort in the lower abdomen or (in men) at the base of the penis.  Follow these instructions at home: Medicines  Take over-the-counter and prescription medicines only as told by your health care provider.  If you were prescribed an antibiotic medicine, take it as told by your health care provider. Do not stop taking the antibiotic even if you start to feel better.  Do not drive for 24 hours if you were given a medicine to help you relax (sedative).  Do not drive or use heavy machinery while taking prescription pain medicine. Eating and drinking  Drink enough water and fluids to keep your urine clear or pale yellow. This helps any remaining pieces of the stone to pass. It can also help prevent new stones from forming.  Eat plenty of fresh fruits and vegetables.  Follow instructions from your health care provider about eating and drinking restrictions. You may be instructed: ? To reduce how much salt (sodium) you eat or drink. Check ingredients and nutrition facts on packaged foods and beverages. ? To reduce how much meat you eat.  Eat the recommended amount of calcium for your age and gender. Ask your health care provider how much calcium you should have. General instructions  Get plenty of rest.  Most people can resume normal activities 1-2 days after the procedure. Ask your health care provider what activities are safe for you.  If directed, strain all urine through the strainer that was provided by your health care provider. ? Keep all fragments for your health care provider to see. Any stones that are found may be sent to a medical lab for examination. The stone may be as small as a grain of salt.  Keep all follow-up visits as told by your health care provider. This is important. Contact a health  care provider if:  You have pain that is severe or does not get better with medicine.  You have nausea that is severe or does not go away.  You have blood in your urine longer than your health care provider told you to expect.  You have more blood in your urine.  You have pain during urination that  does not go away.  You urinate more frequently than usual and this does not go away.  You develop a rash or any other possible signs of an allergic reaction. Get help right away if:  You have severe pain in your back, sides, or upper abdomen.  You have severe pain while urinating.  Your urine is very dark red.  You have blood in your stool (feces).  You cannot pass any urine at all.  You feel a strong urge to urinate after emptying your bladder.  You have a fever or chills.  You develop shortness of breath, difficulty breathing, or chest pain.  You have severe nausea that leads to persistent vomiting.  You faint. Summary  After this procedure, it is common to have some pain, discomfort, or nausea when pieces (fragments) of the kidney stone move through the tube that carries urine from the kidney to the bladder (ureter). If this pain or nausea is severe, however, you should contact your health care provider.  Most people can resume normal activities 1-2 days after the procedure. Ask your health care provider what activities are safe for you.  Drink enough water and fluids to keep your urine clear or pale yellow. This helps any remaining pieces of the stone to pass, and it can help prevent new stones from forming.  If directed, strain your urine and keep all fragments for your health care provider to see. Fragments or stones may be as small as a grain of salt.  Get help right away if you have severe pain in your back, sides, or upper abdomen or have severe pain while urinating. This information is not intended to replace advice given to you by your health care provider. Make  sure you discuss any questions you have with your health care provider. Document Released: 05/16/2007 Document Revised: 03/17/2016 Document Reviewed: 03/17/2016 Elsevier Interactive Patient Education  [employer].  Cystoscopy Cystoscopy is a procedure that is used to help diagnose and sometimes treat conditions that affect that lower urinary tract. The lower urinary tract includes the bladder and the tube that drains urine from the bladder out of the body (urethra). Cystoscopy is performed with a thin, tube-shaped instrument with a light and camera at the end (cystoscope). The cystoscope may be hard (rigid) or flexible, depending on the goal of the procedure.The cystoscope is inserted through the urethra, into the bladder. Cystoscopy may be recommended if you have:  Urinary tractinfections that keep coming back (recurring).  Blood in the urine (hematuria).  Loss of bladder control (urinary incontinence) or an overactive bladder.  Unusual cells found in a urine sample.  A blockage in the urethra.  Painful urination.  An abnormality in the bladder found during an intravenous pyelogram (IVP) or CT scan.  Cystoscopy may also be done to remove a sample of tissue to be examined under a microscope (biopsy). Tell a health care provider about:  Any allergies you have.  All medicines you are taking, including vitamins, herbs, eye drops, creams, and over-the-counter medicines.  Any problems you or family members have had with anesthetic medicines.  Any blood disorders you have.  Any surgeries you have had.  Any medical conditions you have.  Whether you are pregnant or may be pregnant. What are the risks? Generally, this is a safe procedure. However, problems may occur, including:  Infection.  Bleeding.  Allergic reactions to medicines.  Damage to other structures or organs.  What happens before the procedure?  Ask your health  care provider about: ? Changing or  stopping your regular medicines. This is especially important if you are taking diabetes medicines or blood thinners. ? Taking medicines such as aspirin and ibuprofen. These medicines can thin your blood. Do not take these medicines before your procedure if your health care provider instructs you not to.  Follow instructions from your health care provider about eating or drinking restrictions.  You may be given antibiotic medicine to help prevent infection.  You may have an exam or testing, such as X-rays of the bladder, urethra, or kidneys.  You may have urine tests to check for signs of infection.  Plan to have someone take you home after the procedure. What happens during the procedure?  To reduce your risk of infection,your health care team will wash or sanitize their hands.  You will be given one or more of the following: ? A medicine to help you relax (sedative). ? A medicine to numb the area (local anesthetic).  The area around the opening of your urethra will be cleaned.  The cystoscope will be passed through your urethra into your bladder.  Germ-free (sterile)fluid will flow through the cystoscope to fill your bladder. The fluid will stretch your bladder so that your surgeon can clearly examine your bladder walls.  The cystoscope will be removed and your bladder will be emptied. The procedure may vary among health care providers and hospitals. What happens after the procedure?  You may have some soreness or pain in your abdomen and urethra. Medicines will be available to help you.  You may have some blood in your urine.  Do not drive for 24 hours if you received a sedative. This information is not intended to replace advice given to you by your health care provider. Make sure you discuss any questions you have with your health care provider. Document Released: 04/23/2000 Document Revised: 09/04/2015 Document Reviewed: 03/13/2015 Elsevier Interactive Patient Education   2018 [employer].  Cystoscopy, Care After Refer to this sheet in the next few weeks. These instructions provide you with information about caring for yourself after your procedure. Your health care provider may also give you more specific instructions. Your treatment has been planned according to current medical practices, but problems sometimes occur. Call your health care provider if you have any problems or questions after your procedure. What can I expect after the procedure? After the procedure, it is common to have:  Mild pain when you urinate. Pain should stop within a few minutes after you urinate. This may last for up to 1 week.  A small amount of blood in your urine for several days.  Feeling like you need to urinate but producing only a small amount of urine.  Follow these instructions at home:  Medicines  Take over-the-counter and prescription medicines only as told by your health care provider.  If you were prescribed an antibiotic medicine, take it as told by your health care provider. Do not stop taking the antibiotic even if you start to feel better. General instructions   Return to your normal activities as told by your health care provider. Ask your health care provider what activities are safe for you.  Do not drive for 24 hours if you received a sedative.  Watch for any blood in your urine. If the amount of blood in your urine increases, call your health care provider.  Follow instructions from your health care provider about eating or drinking restrictions.  If a tissue sample was removed  for testing (biopsy) during your procedure, it is your responsibility to get your test results. Ask your health care provider or the department performing the test when your results will be ready.  Drink enough fluid to keep your urine clear or pale yellow.  Keep all follow-up visits as told by your health care provider. This is important. Contact a health care provider  if:  You have pain that gets worse or does not get better with medicine, especially pain when you urinate.  You have difficulty urinating. Get help right away if:  You have more blood in your urine.  You have blood clots in your urine.  You have abdominal pain.  You have a fever or chills.  You are unable to urinate. This information is not intended to replace advice given to you by your health care provider. Make sure you discuss any questions you have with your health care provider. Document Released: 11/13/2004 Document Revised: 10/02/2015 Document Reviewed: 03/13/2015 Elsevier Interactive Patient Education  [employer].  Ureteroscopy Ureteroscopy is a procedure to check for and treat problems inside part of the urinary tract. In this procedure, a thin, tube-shaped instrument with a light at the end (ureteroscope) is used to look at the inside of the kidneys and the ureters, which are the tubes that carry urine from the kidneys to the bladder. The ureteroscope is inserted into one or both of the ureters. You may need this procedure if you have frequent urinary tract infections (UTIs), blood in your urine, or a stone in one of your ureters. A ureteroscopy can be done to find the cause of urine blockage in a ureter and to evaluate other abnormalities inside the ureters or kidneys. If stones are found, they can be removed during the procedure. Polyps, abnormal tissue, and some types of tumors can also be removed or treated. The ureteroscope may also have a tool to remove tissue to be checked for disease under a microscope (biopsy). Tell a health care provider about:  Any allergies you have.  All medicines you are taking, including vitamins, herbs, eye drops, creams, and over-the-counter medicines.  Any problems you or family members have had with anesthetic medicines.  Any blood disorders you have.  Any surgeries you have had.  Any medical conditions you have.  Whether you  are pregnant or may be pregnant. What are the risks? Generally, this is a safe procedure. However, problems may occur, including:  Bleeding.  Infection.  Allergic reactions to medicines.  Scarring that narrows the ureter (stricture).  Creating a hole in the ureter (perforation).  What happens before the procedure? Staying hydrated Follow instructions from your health care provider about hydration, which may include:  Up to 2 hours before the procedure - you may continue to drink clear liquids, such as water, clear fruit juice, black coffee, and plain tea.  Eating and drinking restrictions Follow instructions from your health care provider about eating and drinking, which may include:  8 hours before the procedure - stop eating heavy meals or foods such as meat, fried foods, or fatty foods.  6 hours before the procedure - stop eating light meals or foods, such as toast or cereal.  6 hours before the procedure - stop drinking milk or drinks that contain milk.  2 hours before the procedure - stop drinking clear liquids.  Medicines  Ask your health care provider about: ? Changing or stopping your regular medicines. This is especially important if you are taking diabetes medicines  or blood thinners. ? Taking medicines such as aspirin and ibuprofen. These medicines can thin your blood. Do not take these medicines before your procedure if your health care provider instructs you not to.  You may be given antibiotic medicine to help prevent infection. General instructions  You may have a urine sample taken to check for infection.  Plan to have someone take you home from the hospital or clinic. What happens during the procedure?  To reduce your risk of infection: ? Your health care team will wash or sanitize their hands. ? Your skin will be washed with soap.  An IV tube will be inserted into one of your veins.  You will be given one of the following: ? A medicine to help you  relax (sedative). ? A medicine to make you fall asleep (general anesthetic). ? A medicine that is injected into your spine to numb the area below and slightly above the injection site (spinal anesthetic).  To lower your risk of infection, you may be given an antibiotic medicine by an injection or through the IV tube.  The opening from which you urinate (urethra) will be cleaned with a germ-killing solution.  The ureteroscope will be passed through your urethra into your bladder.  A salt-water solution will flow through the ureteroscope to fill your bladder. This will help the health care provider see the openings of your ureters more clearly.  Then, the ureteroscope will be passed into your ureter. ? If a growth is found, a piece of it may be removed so it can be examined under a microscope (biopsy). ? If a stone is found, it may be removed through the ureteroscope, or the stone may be broken up using a laser, shock waves, or electrical energy. ? In some cases, if the ureter is too small, a tube may be inserted that keeps the ureter open (ureteral stent). The stent may be left in place for 1 or 2 weeks to keep the ureter open, and then the ureteroscopy procedure will be performed.  The scope will be removed, and your bladder will be emptied. The procedure may vary among health care providers and hospitals. What happens after the procedure?  Your blood pressure, heart rate, breathing rate, and blood oxygen level will be monitored until the medicines you were given have worn off.  You may be asked to urinate.  Donot drive for 24 hours if you were given a sedative. This information is not intended to replace advice given to you by your health care provider. Make sure you discuss any questions you have with your health care provider. Document Released: 05/01/2013 Document Revised: 02/10/2016 Document Reviewed: 02/06/2016 Elsevier Interactive Patient Education  2018 [employer].  Ureteroscopy, Care After This sheet gives you information about how to care for yourself after your procedure. Your health care provider may also give you more specific instructions. If you have problems or questions, contact your health care provider. What can I expect after the procedure? After the procedure, it is common to have:  A burning sensation when you urinate.  Blood in your urine.  Mild discomfort in the bladder area or kidney area when urinating.  Needing to urinate more often or urgently.  Follow these instructions at home: Medicines  Take over-the-counter and prescription medicines only as told by your health care provider.  If you were prescribed an antibiotic medicine, take it as told by your health care provider. Do not stop taking the antibiotic even if you  start to feel better. General instructions   Donot drive for 24 hours if you were given a medicine to help you relax (sedative) during your procedure.  To relieve burning, try taking a warm bath or holding a warm washcloth over your groin.  Drink enough fluid to keep your urine clear or pale yellow. ? Drink two 8-ounce glasses of water every hour for the first 2 hours after you get home. ? Continue to drink water often at home.  You can eat what you usually do.  Keep all follow-up visits as told by your health care provider. This is important. ? If you had a tube placed to keep urine flowing (ureteral stent), ask your health care provider when you need to return to have it removed. Contact a health care provider if:  You have chills or a fever.  You have burning pain for longer than 24 hours after the procedure.  You have blood in your urine for longer than 24 hours after the procedure. Get help right away if:  You have large amounts of blood in your urine.  You have blood clots in your urine.  You have very bad pain.  You have chest pain or trouble breathing.  You are unable to  urinate and you have the feeling of a full bladder. This information is not intended to replace advice given to you by your health care provider. Make sure you discuss any questions you have with your health care provider. Document Released: 05/01/2013 Document Revised: 02/10/2016 Document Reviewed: 02/06/2016 Elsevier Interactive Patient Education  2018 [place] Anesthesia, Adult General anesthesia is the use of medicines to make a person "go to sleep" (be unconscious) for a medical procedure. General anesthesia is often recommended when a procedure:  Is long.  Requires you to be still or in an unusual position.  Is major and can cause you to lose blood.  Is impossible to do without general anesthesia.  The medicines used for general anesthesia are called general anesthetics. In addition to making you sleep, the medicines:  Prevent pain.  Control your blood pressure.  Relax your muscles.  Tell a health care provider about:  Any allergies you have.  All medicines you are taking, including vitamins, herbs, eye drops, creams, and over-the-counter medicines.  Any problems you or family members have had with anesthetic medicines.  Types of anesthetics you have had in the past.  Any bleeding disorders you have.  Any surgeries you have had.  Any medical conditions you have.  Any history of heart or lung conditions, such as heart failure, sleep apnea, or chronic obstructive pulmonary disease (COPD).  Whether you are pregnant or may be pregnant.  Whether you use tobacco, alcohol, marijuana, or street drugs.  Any history of Armed forces logistics/support/administrative officer.  Any history of depression or anxiety. What are the risks? Generally, this is a safe procedure. However, problems may occur, including:  Allergic reaction to anesthetics.  Lung and heart problems.  Inhaling food or liquids from your stomach into your lungs (aspiration).  Injury to nerves.  Waking up during your  procedure and being unable to move (rare).  Extreme agitation or a state of mental confusion (delirium) when you wake up from the anesthetic.  Air in the bloodstream, which can lead to stroke.  These problems are more likely to develop if you are having a major surgery or if you have an advanced medical condition. You can prevent some of these complications by answering all  of your health care provider's questions thoroughly and by following all pre-procedure instructions. General anesthesia can cause side effects, including:  Nausea or vomiting  A sore throat from the breathing tube.  Feeling cold or shivery.  Feeling tired, washed out, or achy.  Sleepiness or drowsiness.  Confusion or agitation.  What happens before the procedure? Staying hydrated Follow instructions from your health care provider about hydration, which may include:  Up to 2 hours before the procedure - you may continue to drink clear liquids, such as water, clear fruit juice, black coffee, and plain tea.  Eating and drinking restrictions Follow instructions from your health care provider about eating and drinking, which may include:  8 hours before the procedure - stop eating heavy meals or foods such as meat, fried foods, or fatty foods.  6 hours before the procedure - stop eating light meals or foods, such as toast or cereal.  6 hours before the procedure - stop drinking milk or drinks that contain milk.  2 hours before the procedure - stop drinking clear liquids.  Medicines  Ask your health care provider about: ? Changing or stopping your regular medicines. This is especially important if you are taking diabetes medicines or blood thinners. ? Taking medicines such as aspirin and ibuprofen. These medicines can thin your blood. Do not take these medicines before your procedure if your health care provider instructs you not to. ? Taking new dietary supplements or medicines. Do not take these during the  week before your procedure unless your health care provider approves them.  If you are told to take a medicine or to continue taking a medicine on the day of the procedure, take the medicine with sips of water. General instructions   Ask if you will be going home the same day, the following day, or after a longer hospital stay. ? Plan to have someone take you home. ? Plan to have someone stay with you for the first 24 hours after you leave the hospital or clinic.  For 3-6 weeks before the procedure, try not to use any tobacco products, such as cigarettes, chewing tobacco, and e-cigarettes.  You may brush your teeth on the morning of the procedure, but make sure to spit out the toothpaste. What happens during the procedure?  You will be given anesthetics through a mask and through an IV tube in one of your veins.  You may receive medicine to help you relax (sedative).  As soon as you are asleep, a breathing tube may be used to help you breathe.  An anesthesia specialist will stay with you throughout the procedure. He or she will help keep you comfortable and safe by continuing to give you medicines and adjusting the amount of medicine that you get. He or she will also watch your blood pressure, pulse, and oxygen levels to make sure that the anesthetics do not cause any problems.  If a breathing tube was used to help you breathe, it will be removed before you wake up. The procedure may vary among health care providers and hospitals. What happens after the procedure?  You will wake up, often slowly, after the procedure is complete, usually in a recovery area.  Your blood pressure, heart rate, breathing rate, and blood oxygen level will be monitored until the medicines you were given have worn off.  You may be given medicine to help you calm down if you feel anxious or agitated.  If you will be going home the same  day, your health care provider may check to make sure you can stand,  drink, and urinate.  Your health care providers will treat your pain and side effects before you go home.  Do not drive for 24 hours if you received a sedative.  You may: ? Feel nauseous and vomit. ? Have a sore throat. ? Have mental slowness. ? Feel cold or shivery. ? Feel sleepy. ? Feel tired. ? Feel sore or achy, even in parts of your body where you did not have surgery. This information is not intended to replace advice given to you by your health care provider. Make sure you discuss any questions you have with your health care provider. Document Released: 08/03/2007 Document Revised: 10/07/2015 Document Reviewed: 04/10/2015 Elsevier Interactive Patient Education  2018 [place] Anesthesia, Adult, Care After These instructions provide you with information about caring for yourself after your procedure. Your health care provider may also give you more specific instructions. Your treatment has been planned according to current medical practices, but problems sometimes occur. Call your health care provider if you have any problems or questions after your procedure. What can I expect after the procedure? After the procedure, it is common to have:  Vomiting.  A sore throat.  Mental slowness.  It is common to feel:  Nauseous.  Cold or shivery.  Sleepy.  Tired.  Sore or achy, even in parts of your body where you did not have surgery.  Follow these instructions at home: For at least 24 hours after the procedure:  Do not: ? Participate in activities where you could fall or become injured. ? Drive. ? Use heavy machinery. ? Drink alcohol. ? Take sleeping pills or medicines that cause drowsiness. ? Make important decisions or sign legal documents. ? Take care of children on your own.  Rest. Eating and drinking  If you vomit, drink water, juice, or soup when you can drink without vomiting.  Drink enough fluid to keep your urine clear or pale  yellow.  Make sure you have little or no nausea before eating solid foods.  Follow the diet recommended by your health care provider. General instructions  Have a responsible adult stay with you until you are awake and alert.  Return to your normal activities as told by your health care provider. Ask your health care provider what activities are safe for you.  Take over-the-counter and prescription medicines only as told by your health care provider.  If you smoke, do not smoke without supervision.  Keep all follow-up visits as told by your health care provider. This is important. Contact a health care provider if:  You continue to have nausea or vomiting at home, and medicines are not helpful.  You cannot drink fluids or start eating again.  You cannot urinate after 8-12 hours.  You develop a skin rash.  You have fever.  You have increasing redness at the site of your procedure. Get help right away if:  You have difficulty breathing.  You have chest pain.  You have unexpected bleeding.  You feel that you are having a life-threatening or urgent problem. This information is not intended to replace advice given to you by your health care provider. Make sure you discuss any questions you have with your health care provider. Document Released: 08/02/2000 Document Revised: 09/29/2015 Document Reviewed: 04/10/2015 Elsevier Interactive Patient Education  [employer].

## 2017-06-22 ENCOUNTER — Other Ambulatory Visit: Payer: Self-pay

## 2017-06-22 ENCOUNTER — Encounter (HOSPITAL_COMMUNITY): Payer: Self-pay

## 2017-06-22 ENCOUNTER — Encounter (HOSPITAL_COMMUNITY)
Admission: RE | Admit: 2017-06-22 | Discharge: 2017-06-22 | Disposition: A | Discharge status: Home / Self Care | Payer: Medicare Other | Source: Ambulatory Visit | Attending: Urology | Admitting: Urology

## 2017-06-22 DIAGNOSIS — K219 Gastro-esophageal reflux disease without esophagitis: Secondary | ICD-10-CM | POA: Insufficient documentation

## 2017-06-22 DIAGNOSIS — N289 Disorder of kidney and ureter, unspecified: Secondary | ICD-10-CM | POA: Diagnosis not present

## 2017-06-22 DIAGNOSIS — Z01810 Encounter for preprocedural cardiovascular examination: Secondary | ICD-10-CM | POA: Diagnosis not present

## 2017-06-22 DIAGNOSIS — F419 Anxiety disorder, unspecified: Secondary | ICD-10-CM | POA: Diagnosis not present

## 2017-06-22 DIAGNOSIS — D649 Anemia, unspecified: Secondary | ICD-10-CM | POA: Insufficient documentation

## 2017-06-22 DIAGNOSIS — Z794 Long term (current) use of insulin: Secondary | ICD-10-CM | POA: Insufficient documentation

## 2017-06-22 DIAGNOSIS — Z01812 Encounter for preprocedural laboratory examination: Secondary | ICD-10-CM | POA: Diagnosis not present

## 2017-06-22 DIAGNOSIS — E119 Type 2 diabetes mellitus without complications: Secondary | ICD-10-CM | POA: Diagnosis not present

## 2017-06-22 DIAGNOSIS — R9431 Abnormal electrocardiogram [ECG] [EKG]: Secondary | ICD-10-CM | POA: Diagnosis not present

## 2017-06-22 DIAGNOSIS — I739 Peripheral vascular disease, unspecified: Secondary | ICD-10-CM | POA: Diagnosis not present

## 2017-06-22 DIAGNOSIS — I4430 Unspecified atrioventricular block: Secondary | ICD-10-CM | POA: Diagnosis not present

## 2017-06-22 DIAGNOSIS — I10 Essential (primary) hypertension: Secondary | ICD-10-CM | POA: Diagnosis not present

## 2017-06-22 DIAGNOSIS — Z79899 Other long term (current) drug therapy: Secondary | ICD-10-CM | POA: Insufficient documentation

## 2017-06-22 DIAGNOSIS — M069 Rheumatoid arthritis, unspecified: Secondary | ICD-10-CM | POA: Insufficient documentation

## 2017-06-22 DIAGNOSIS — I2510 Atherosclerotic heart disease of native coronary artery without angina pectoris: Secondary | ICD-10-CM | POA: Insufficient documentation

## 2017-06-22 HISTORY — DX: Anxiety disorder, unspecified: F41.9

## 2017-06-22 HISTORY — DX: Unspecified osteoarthritis, unspecified site: M19.90

## 2017-06-22 HISTORY — DX: Personal history of urinary calculi: Z87.442

## 2017-06-22 LAB — CBC WITH DIFFERENTIAL/PLATELET
BASOS PCT: 0 %
Basophils Absolute: 0 10*3/uL (ref 0.0–0.1)
EOS ABS: 0.1 10*3/uL (ref 0.0–0.7)
EOS PCT: 1 %
HCT: 38.8 % (ref 36.0–46.0)
HEMOGLOBIN: 12.4 g/dL (ref 12.0–15.0)
LYMPHS ABS: 1.5 10*3/uL (ref 0.7–4.0)
Lymphocytes Relative: 17 %
MCH: 28.8 pg (ref 26.0–34.0)
MCHC: 32 g/dL (ref 30.0–36.0)
MCV: 90.2 fL (ref 78.0–100.0)
MONO ABS: 0.4 10*3/uL (ref 0.1–1.0)
MONOS PCT: 5 %
NEUTROS PCT: 77 %
Neutro Abs: 6.7 10*3/uL (ref 1.7–7.7)
Platelets: 181 10*3/uL (ref 150–400)
RBC: 4.3 MIL/uL (ref 3.87–5.11)
RDW: 13.9 % (ref 11.5–15.5)
WBC: 8.8 10*3/uL (ref 4.0–10.5)

## 2017-06-22 LAB — HEMOGLOBIN A1C
HEMOGLOBIN A1C: 7.5 % — AB (ref 4.8–5.6)
Mean Plasma Glucose: 168.55 mg/dL

## 2017-06-22 LAB — BASIC METABOLIC PANEL
Anion gap: 14 (ref 5–15)
BUN: 22 mg/dL — AB (ref 6–20)
CALCIUM: 8.3 mg/dL — AB (ref 8.9–10.3)
CHLORIDE: 100 mmol/L — AB (ref 101–111)
CO2: 19 mmol/L — ABNORMAL LOW (ref 22–32)
CREATININE: 0.75 mg/dL (ref 0.44–1.00)
GFR calc Af Amer: >60 mL/min (ref >60)
GFR calc non Af Amer: >60 mL/min (ref >60)
Glucose, Bld: 181 mg/dL — ABNORMAL HIGH (ref 65–99)
Potassium: 3.5 mmol/L (ref 3.5–5.1)
SODIUM: 133 mmol/L — AB (ref 135–145)

## 2017-06-22 LAB — GLUCOSE, CAPILLARY: Glucose-Capillary: 184 mg/dL — ABNORMAL HIGH (ref 65–99)

## 2017-06-22 NOTE — Telephone Encounter (Signed)
Pt just needs an order for CPAP supplies  Please send

## 2017-06-27 ENCOUNTER — Ambulatory Visit (HOSPITAL_COMMUNITY): Payer: Medicare Other | Admitting: Anesthesiology

## 2017-06-27 ENCOUNTER — Ambulatory Visit (HOSPITAL_COMMUNITY): Payer: Medicare Other

## 2017-06-27 ENCOUNTER — Encounter (HOSPITAL_COMMUNITY)
Admission: RE | Disposition: A | Discharge status: Home / Self Care | Payer: Self-pay | Source: Ambulatory Visit | Attending: Urology

## 2017-06-27 ENCOUNTER — Encounter (HOSPITAL_COMMUNITY): Payer: Self-pay | Admitting: *Deleted

## 2017-06-27 ENCOUNTER — Ambulatory Visit (HOSPITAL_COMMUNITY)
Admission: RE | Admit: 2017-06-27 | Discharge: 2017-06-27 | Disposition: A | Discharge status: Home / Self Care | Payer: Medicare Other | Source: Ambulatory Visit | Attending: Urology | Admitting: Urology

## 2017-06-27 DIAGNOSIS — I110 Hypertensive heart disease with heart failure: Secondary | ICD-10-CM | POA: Insufficient documentation

## 2017-06-27 DIAGNOSIS — F419 Anxiety disorder, unspecified: Secondary | ICD-10-CM | POA: Diagnosis not present

## 2017-06-27 DIAGNOSIS — I509 Heart failure, unspecified: Secondary | ICD-10-CM | POA: Diagnosis not present

## 2017-06-27 DIAGNOSIS — M069 Rheumatoid arthritis, unspecified: Secondary | ICD-10-CM | POA: Insufficient documentation

## 2017-06-27 DIAGNOSIS — E669 Obesity, unspecified: Secondary | ICD-10-CM | POA: Diagnosis not present

## 2017-06-27 DIAGNOSIS — G4730 Sleep apnea, unspecified: Secondary | ICD-10-CM | POA: Insufficient documentation

## 2017-06-27 DIAGNOSIS — Z87442 Personal history of urinary calculi: Secondary | ICD-10-CM | POA: Insufficient documentation

## 2017-06-27 DIAGNOSIS — Z888 Allergy status to other drugs, medicaments and biological substances status: Secondary | ICD-10-CM | POA: Diagnosis not present

## 2017-06-27 DIAGNOSIS — Z6836 Body mass index (BMI) 36.0-36.9, adult: Secondary | ICD-10-CM | POA: Diagnosis not present

## 2017-06-27 DIAGNOSIS — E1142 Type 2 diabetes mellitus with diabetic polyneuropathy: Secondary | ICD-10-CM | POA: Insufficient documentation

## 2017-06-27 DIAGNOSIS — Z8249 Family history of ischemic heart disease and other diseases of the circulatory system: Secondary | ICD-10-CM | POA: Insufficient documentation

## 2017-06-27 DIAGNOSIS — N200 Calculus of kidney: Secondary | ICD-10-CM | POA: Diagnosis not present

## 2017-06-27 DIAGNOSIS — N201 Calculus of ureter: Secondary | ICD-10-CM

## 2017-06-27 DIAGNOSIS — K219 Gastro-esophageal reflux disease without esophagitis: Secondary | ICD-10-CM | POA: Diagnosis not present

## 2017-06-27 DIAGNOSIS — E11319 Type 2 diabetes mellitus with unspecified diabetic retinopathy without macular edema: Secondary | ICD-10-CM | POA: Diagnosis not present

## 2017-06-27 DIAGNOSIS — Z794 Long term (current) use of insulin: Secondary | ICD-10-CM | POA: Diagnosis not present

## 2017-06-27 DIAGNOSIS — E1151 Type 2 diabetes mellitus with diabetic peripheral angiopathy without gangrene: Secondary | ICD-10-CM | POA: Insufficient documentation

## 2017-06-27 DIAGNOSIS — N202 Calculus of kidney with calculus of ureter: Secondary | ICD-10-CM | POA: Diagnosis present

## 2017-06-27 HISTORY — PX: CYSTOSCOPY/RETROGRADE/URETEROSCOPY: SHX5316

## 2017-06-27 HISTORY — PX: HOLMIUM LASER APPLICATION: SHX5852

## 2017-06-27 LAB — GLUCOSE, CAPILLARY
GLUCOSE-CAPILLARY: 116 mg/dL — AB (ref 65–99)
GLUCOSE-CAPILLARY: 104 mg/dL — AB (ref 65–99)

## 2017-06-27 SURGERY — CYSTOSCOPY/RETROGRADE/URETEROSCOPY
Anesthesia: General | Site: Ureter | Laterality: Right

## 2017-06-27 MED ORDER — SODIUM CHLORIDE 0.9 % IR SOLN
Status: DC | PRN
  Administered 2017-06-27 (×2): 3000 mL via INTRAVESICAL

## 2017-06-27 MED ORDER — ONDANSETRON HCL 4 MG/2ML IJ SOLN
INTRAMUSCULAR | Status: AC
  Filled 2017-06-27: qty 2

## 2017-06-27 MED ORDER — CEFAZOLIN SODIUM-DEXTROSE 2-4 GM/100ML-% IV SOLN
INTRAVENOUS | Status: AC
  Filled 2017-06-27: qty 100

## 2017-06-27 MED ORDER — STERILE WATER FOR IRRIGATION IR SOLN
Status: DC | PRN
  Administered 2017-06-27: 500 mL

## 2017-06-27 MED ORDER — LIDOCAINE HCL (PF) 2 % IJ SOLN
INTRAMUSCULAR | Status: DC | PRN
  Administered 2017-06-27: 40 mg via INTRADERMAL

## 2017-06-27 MED ORDER — PROPOFOL 10 MG/ML IV BOLUS
INTRAVENOUS | Status: AC
  Filled 2017-06-27: qty 20

## 2017-06-27 MED ORDER — MIDAZOLAM HCL 2 MG/2ML IJ SOLN
1.0000 mg | INTRAMUSCULAR | Status: AC
  Administered 2017-06-27: 2 mg via INTRAVENOUS

## 2017-06-27 MED ORDER — PROPOFOL 10 MG/ML IV BOLUS
INTRAVENOUS | Status: DC | PRN
  Administered 2017-06-27: 100 mg via INTRAVENOUS

## 2017-06-27 MED ORDER — OXYCODONE-ACETAMINOPHEN 5-325 MG PO TABS: ORAL_TABLET | ORAL | 0 refills | Status: DC

## 2017-06-27 MED ORDER — MIDAZOLAM HCL 2 MG/2ML IJ SOLN
INTRAMUSCULAR | Status: AC
  Filled 2017-06-27: qty 2

## 2017-06-27 MED ORDER — FENTANYL CITRATE (PF) 100 MCG/2ML IJ SOLN: 25.0000 ug | INTRAMUSCULAR | Status: DC | PRN

## 2017-06-27 MED ORDER — SULFAMETHOXAZOLE-TRIMETHOPRIM 800-160 MG PO TABS: 1.0000 | ORAL_TABLET | Freq: Two times a day (BID) | ORAL | 0 refills | Status: DC

## 2017-06-27 MED ORDER — FENTANYL CITRATE (PF) 100 MCG/2ML IJ SOLN
INTRAMUSCULAR | Status: AC
  Filled 2017-06-27: qty 2

## 2017-06-27 MED ORDER — LIDOCAINE HCL (PF) 1 % IJ SOLN
INTRAMUSCULAR | Status: AC
  Filled 2017-06-27: qty 5

## 2017-06-27 MED ORDER — ONDANSETRON HCL 4 MG/2ML IJ SOLN
4.0000 mg | Freq: Once | INTRAMUSCULAR | Status: AC
  Administered 2017-06-27: 4 mg via INTRAVENOUS

## 2017-06-27 MED ORDER — DIATRIZOATE MEGLUMINE 30 % UR SOLN
URETHRAL | Status: DC | PRN
  Administered 2017-06-27: 7 mL via URETHRAL

## 2017-06-27 MED ORDER — LACTATED RINGERS IV SOLN
INTRAVENOUS | Status: DC
  Administered 2017-06-27: 09:00:00 via INTRAVENOUS

## 2017-06-27 MED ORDER — CEFAZOLIN SODIUM-DEXTROSE 2-4 GM/100ML-% IV SOLN
2.0000 g | INTRAVENOUS | Status: AC
  Administered 2017-06-27: 2 g via INTRAVENOUS

## 2017-06-27 MED ORDER — FENTANYL CITRATE (PF) 100 MCG/2ML IJ SOLN
INTRAMUSCULAR | Status: DC | PRN
  Administered 2017-06-27: 50 ug via INTRAVENOUS

## 2017-06-27 SURGICAL SUPPLY — 21 items
BAG DRAIN URO TABLE W/ADPT NS (DRAPE) ×3 IMPLANT
BAG HAMPER (MISCELLANEOUS) ×3 IMPLANT
CATH INTERMIT  6FR 70CM (CATHETERS) ×3 IMPLANT
CLOTH BEACON ORANGE TIMEOUT ST (SAFETY) ×3 IMPLANT
FIBER LASER FLEXIVA 200 (UROLOGICAL SUPPLIES) ×3 IMPLANT
GLOVE BIO SURGEON STRL SZ8 (GLOVE) ×3 IMPLANT
GLOVE SS N UNI LF 6.5 STRL (GLOVE) ×3 IMPLANT
GLOVE SURG SS PI 6.5 STRL IVOR (GLOVE) ×3 IMPLANT
GOWN STRL REUS W/ TWL LRG LVL3 (GOWN DISPOSABLE) ×1 IMPLANT
GOWN STRL REUS W/ TWL XL LVL3 (GOWN DISPOSABLE) ×1 IMPLANT
GOWN STRL REUS W/TWL LRG LVL3 (GOWN DISPOSABLE) ×3
GOWN STRL REUS W/TWL XL LVL3 (GOWN DISPOSABLE) ×3
GUIDEWIRE STR DUAL SENSOR (WIRE) ×3 IMPLANT
GUIDEWIRE STR ZIPWIRE 035X150 (MISCELLANEOUS) ×3 IMPLANT
IV NS IRRIG 3000ML ARTHROMATIC (IV SOLUTION) ×6 IMPLANT
KIT ROOM TURNOVER AP CYSTO (KITS) ×3 IMPLANT
MANIFOLD NEPTUNE II (INSTRUMENTS) ×3 IMPLANT
PACK CYSTO (CUSTOM PROCEDURE TRAY) ×3 IMPLANT
PAD ARMBOARD 7.5X6 YLW CONV (MISCELLANEOUS) ×3 IMPLANT
STENT URET 6FRX24 CONTOUR (STENTS) ×3 IMPLANT
WATER STERILE IRR 500ML POUR (IV SOLUTION) ×3 IMPLANT

## 2017-06-27 NOTE — Anesthesia Postprocedure Evaluation (Signed)
Anesthesia Post Note  Patient: DEIDRE CARINO  Procedure(s) Performed: CYSTOSCOPY/RIGHT RETROGRADE PYELOGRAM/RIGHT URETEROSCOPY (Right Ureter) HOLMIUM LASER LITHOTRIPSY RIGHT RENAL CALCULUS (Right Ureter)  Patient location during evaluation: PACU Anesthesia Type: General Level of consciousness: awake and alert and patient cooperative Pain management: satisfactory to patient Vital Signs Assessment: post-procedure vital signs reviewed and stable Respiratory status: spontaneous breathing and patient connected to nasal cannula oxygen Cardiovascular status: stable Postop Assessment: no apparent nausea or vomiting Anesthetic complications: no     Last Vitals:  Vitals:   06/27/17 1010 06/27/17 1135  BP: (!) 150/66 (!) 168/72  Pulse:    Resp: (!) 23 16  Temp:  37.1 C  SpO2: 99% 97%    Last Pain:  Vitals:   06/27/17 0844  TempSrc: Oral                 Diontay Rosencrans

## 2017-06-27 NOTE — Transfer of Care (Signed)
Immediate Anesthesia Transfer of Care Note  Patient: Shelly Jackson  Procedure(s) Performed: CYSTOSCOPY/RIGHT RETROGRADE PYELOGRAM/RIGHT URETEROSCOPY (Right Ureter) HOLMIUM LASER LITHOTRIPSY RIGHT RENAL CALCULUS (Right Ureter)  Patient Location: PACU  Anesthesia Type:General  Level of Consciousness: awake, alert , oriented and patient cooperative  Airway & Oxygen Therapy: Patient Spontanous Breathing and Patient connected to nasal cannula oxygen  Post-op Assessment: Report given to RN and Post -op Vital signs reviewed and stable  Post vital signs: Reviewed and stable  Last Vitals:  Vitals:   06/27/17 1005 06/27/17 1010  BP: (!) 155/67 (!) 150/66  Pulse:    Resp: (!) 21 (!) 23  Temp:    SpO2: 96% 99%    Last Pain:  Vitals:   06/27/17 0844  TempSrc: Oral         Complications: No apparent anesthesia complications

## 2017-06-27 NOTE — H&P (Signed)
Urology Admission H&P  Chief Complaint: right abdominal pain  History of Present Illness: Shelly Jackson is a 83yo with a hx of nephrolithiasis. She has a 22mm right distal ureteral calculus and has failed medical expulsive therapy. She has intermittent sharp, mild nonradiaitng right abdominal pain. She has associated urgency and frequency  Past Medical History:  Diagnosis Date  . Acid reflux   . Anemia    Secondary to acute blood loss  . Anxiety   . Arthritis    RA  . CHF (congestive heart failure) ([place])   . Diabetes mellitus   . Diabetic retinopathy ([place])   . Diverticulitis 07/24/11   Inpatient [facility]  . History of kidney stones   . Hypertension   . Obesity   . Rheumatoid arthritis(714.0)   . Sleep apnea   . Thrombocytopenia ([place])   . Tubular adenoma of colon 07/24/11   Past Surgical History:  Procedure Laterality Date  . APPENDECTOMY    . CATARACT EXTRACTION Bilateral (right 08/2014) ( left 2005)  . CHOLECYSTECTOMY    . COLONOSCOPY  07/24/2011   Dr. Deatra Ina MM polyp at proximal ascending colon could not be removed because of difficult approach, pancolonic diverticulosis with diverticulitis involving the ascending colon, and suspected diverticular bleed, 7 mm polyp snared from proximal sigmoid colon with hemorrhagic surface found to be a tubular adenoma, small external hemorrhoids  . COLONOSCOPY  08/2007   Dr. Malva Limes hyperplastic polyp  . COLONOSCOPY N/A 12/14/2012   Procedure: COLONOSCOPY;  Surgeon: Daneil Dolin, MD;  Location: [facility];  Service: Endoscopy;  Laterality: N/A;  8:30  . TONSILLECTOMY      Home Medications:  Current Facility-Administered Medications  Medication Dose Route Frequency Provider Last Rate Last Dose  . ceFAZolin (ANCEF) IVPB 2g/100 mL premix  2 g Intravenous 30 min Pre-Op Odyn Turko, Candee Furbish, MD      . lactated ringers infusion   Intravenous Continuous Lerry Liner, MD 75 mL/hr at 06/27/17 0915     Allergies:  Allergies  Allergen Reactions   . Daypro [Oxaprozin]     Indigestion     Family History  Problem Relation Age of Onset  . Coronary artery disease Father   . Heart attack Father   . Cirrhosis Mother   . Hypertension Mother   . Colon cancer Neg Hx    Social History:  reports that  has never smoked. she has never used smokeless tobacco. She reports that she does not drink alcohol or use drugs.  Review of Systems  Gastrointestinal: Positive for abdominal pain.  Genitourinary: Positive for frequency and urgency.  All other systems reviewed and are negative.   Physical Exam:  Vital signs in last 24 hours: Temp:  [97.7 F (36.5 C)] 97.7 F (36.5 C) (02/18 0844) Pulse Rate:  [56] 56 (02/18 0908) Resp:  [14-26] 23 (02/18 1010) BP: (146-166)/(61-73) 150/66 (02/18 1010) SpO2:  [90 %-99 %] 99 % (02/18 1010) Weight:  [93.4 kg (206 lb)] 93.4 kg (206 lb) (02/18 0844) Physical Exam  Constitutional: She is oriented to person, place, and time. She appears well-developed and well-nourished.  HENT:  Head: Normocephalic and atraumatic.  Eyes: EOM are normal. Pupils are equal, round, and reactive to light.  Neck: Normal range of motion. No thyromegaly present.  Cardiovascular: Normal rate and regular rhythm.  Respiratory: Effort normal. No respiratory distress.  GI: Soft. She exhibits no distension.  Musculoskeletal: Normal range of motion. She exhibits no edema.  Neurological: She is alert and oriented to  person, place, and time.  Skin: Skin is warm and dry.  Psychiatric: She has a normal mood and affect. Her behavior is normal. Judgment and thought content normal.    Laboratory Data:  Results for orders placed or performed during the hospital encounter of 06/27/17 (from the past 24 hour(s))  Glucose, capillary     Status: Abnormal   Collection Time: 06/27/17  9:03 AM  Result Value Ref Range   Glucose-Capillary 116 (H) 65 - 99 mg/dL   No results found for this or any previous visit (from the past 240  hour(s)). Creatinine: Recent Labs    06/22/17 1428  CREATININE 0.75   Baseline Creatinine: 0.75  Impression/Assessment:  82yo with right distal ureteral calculus  Plan:  The risks/benefits/alternatives to right ureteroscopic stone extraction was explained to the patient and she understands and wishes to proceed with surgery  Nicolette Bang 06/27/2017, 10:32 AM

## 2017-06-27 NOTE — Anesthesia Postprocedure Evaluation (Signed)
Anesthesia Post Note  Patient: Shelly Jackson  Procedure(s) Performed: CYSTOSCOPY/RIGHT RETROGRADE PYELOGRAM/RIGHT URETEROSCOPY (Right Ureter) HOLMIUM LASER LITHOTRIPSY RIGHT RENAL CALCULUS (Right Ureter)  Patient location during evaluation: PACU Anesthesia Type: General Level of consciousness: awake, awake and alert, oriented and patient cooperative Pain management: pain level controlled Vital Signs Assessment: post-procedure vital signs reviewed and stable Respiratory status: spontaneous breathing, nonlabored ventilation, respiratory function stable and patient connected to nasal cannula oxygen Cardiovascular status: stable Postop Assessment: no apparent nausea or vomiting Anesthetic complications: no     Last Vitals:  Vitals:   06/27/17 1005 06/27/17 1010  BP: (!) 155/67 (!) 150/66  Pulse:    Resp: (!) 21 (!) 23  Temp:    SpO2: 96% 99%    Last Pain:  Vitals:   06/27/17 0844  TempSrc: Oral                 Ikeya Brockel L

## 2017-06-27 NOTE — Op Note (Signed)
Preoperative diagnosis: Right ureteral stone  Postoperative diagnosis: right renal stone  Procedure: 1 cystoscopy 2 right retrograde pyelography 3.  Intraoperative fluoroscopy, under one hour, with interpretation 4.  Right ureteroscopic stone manipulation with laser lithotripsy 5.  Right 6 x 26 JJ stent placement  Attending: Rosie Fate  Anesthesia: General  Estimated blood loss: None  Drains: Right 6 x 24 JJ ureteral stent with tether  Specimens: none  Antibiotics: ancef  Findings: No ureteral stone encountered. Mid pole 24mm calculus that was dusted.  Indications: Patient is a 83 year old female with a history of ureteral stone and who has failed medical expulsive therapy.  After discussing treatment options, she decided proceed with right ureteroscopic stone manipulation.  Procedure her in detail: The patient was brought to the operating room and a brief timeout was done to ensure correct patient, correct procedure, correct site.  General anesthesia was administered patient was placed in dorsal lithotomy position.  Her genitalia was then prepped and draped in usual sterile fashion.  A rigid 37 French cystoscope was passed in the urethra and the bladder.  Bladder was inspected free masses or lesions.  the right ureteral orifices were in the normal orthotopic locations.  a 6 french ureteral catheter was then instilled into the right ureter orifice.  a gentle retrograde was obtained and findings noted above.  we then placed a zip wire through the ureteral catheter and advanced up to the renal pelvis.  we then removed the cystoscope and cannulated the right ureteral orifice with a semirigid ureteroscope. we encountered no stone in the ureter. Once we reached the UPJ with advanced a sensor wire into the renal pelvis. We then removed the scope and advanced a flexible ureteroscope over the sensor wire and up to the renal pelvis. We then performed nephroscopy and located a stone in the mid  pole. using a 200 nm laser fiber the stone was dusted. Once this was complete we removed the scope and placed the 6x24 JJ stent over the original zip wire. We then removed the wire and good coil was noted in the the renal pelvis under fluoroscopy and the bladder under direct vision. the bladder was then drained and this concluded the procedure which was well tolerated by patient.  Complications: None  Condition: Stable, extubated, transferred to PACU  Plan: Patient is to be discharged home as to follow-up in one week. She is to remove her stent by pulling the tether in 72 hours

## 2017-06-27 NOTE — Discharge Instructions (Signed)
Ureteral Stent Implantation, Care After Refer to this sheet in the next few weeks. These instructions provide you with information about caring for yourself after your procedure. Your health care provider may also give you more specific instructions. Your treatment has been planned according to current medical practices, but problems sometimes occur. Call your health care provider if you have any problems or questions after your procedure. What can I expect after the procedure? After the procedure, it is common to have:  Nausea.  Mild pain when you urinate. You may feel this pain in your lower back or lower abdomen. Pain should stop within a few minutes after you urinate. This may last for up to 1 week.  A small amount of blood in your urine for several days.  Follow these instructions at home:  Medicines  Take over-the-counter and prescription medicines only as told by your health care provider.  If you were prescribed an antibiotic medicine, take it as told by your health care provider. Do not stop taking the antibiotic even if you start to feel better.  Do not drive for 24 hours if you received a sedative.  Do not drive or operate heavy machinery while taking prescription pain medicines. Activity  Return to your normal activities as told by your health care provider. Ask your health care provider what activities are safe for you.  Do not lift anything that is heavier than 10 lb (4.5 kg). Follow this limit for 1 week after your procedure, or for as long as told by your health care provider. General instructions  Watch for any blood in your urine. Call your health care provider if the amount of blood in your urine increases.  If you have a catheter: ? Follow instructions from your health care provider about taking care of your catheter and collection bag. ? Do not take baths, swim, or use a hot tub until your health care provider approves.  Drink enough fluid to keep your urine  clear or pale yellow.  Keep all follow-up visits as told by your health care provider. This is important. Contact a health care provider if:  You have pain that gets worse or does not get better with medicine, especially pain when you urinate.  You have difficulty urinating.  You feel nauseous or you vomit repeatedly during a period of more than 2 days after the procedure. Get help right away if:  Your urine is dark red or has blood clots in it.  You are leaking urine (have incontinence).  The end of the stent comes out of your urethra.  You cannot urinate.  You have sudden, sharp, or severe pain in your abdomen or lower back.  You have a fever. This information is not intended to replace advice given to you by your health care provider. Make sure you discuss any questions you have with your health care provider. Document Released: 12/27/2012 Document Revised: 10/02/2015 Document Reviewed: 11/08/2014 Elsevier Interactive Patient Education  2018 [place] STENT IN 64 HOURS BY GENTLY PULLING THE STRING      General Anesthesia, Adult, Care After These instructions provide you with information about caring for yourself after your procedure. Your health care provider may also give you more specific instructions. Your treatment has been planned according to current medical practices, but problems sometimes occur. Call your health care provider if you have any problems or questions after your procedure. What can I expect after the procedure? After the procedure, it is  common to have:  Vomiting.  A sore throat.  Mental slowness.  It is common to feel:  Nauseous.  Cold or shivery.  Sleepy.  Tired.  Sore or achy, even in parts of your body where you did not have surgery.  Follow these instructions at home: For at least 24 hours after the procedure:  Do not: ? Participate in activities where you could fall or become injured. ? Drive. ? Use  heavy machinery. ? Drink alcohol. ? Take sleeping pills or medicines that cause drowsiness. ? Make important decisions or sign legal documents. ? Take care of children on your own.  Rest. Eating and drinking  If you vomit, drink water, juice, or soup when you can drink without vomiting.  Drink enough fluid to keep your urine clear or pale yellow.  Make sure you have little or no nausea before eating solid foods.  Follow the diet recommended by your health care provider. General instructions  Have a responsible adult stay with you until you are awake and alert.  Return to your normal activities as told by your health care provider. Ask your health care provider what activities are safe for you.  Take over-the-counter and prescription medicines only as told by your health care provider.  If you smoke, do not smoke without supervision.  Keep all follow-up visits as told by your health care provider. This is important. Contact a health care provider if:  You continue to have nausea or vomiting at home, and medicines are not helpful.  You cannot drink fluids or start eating again.  You cannot urinate after 8-12 hours.  You develop a skin rash.  You have fever.  You have increasing redness at the site of your procedure. Get help right away if:  You have difficulty breathing.  You have chest pain.  You have unexpected bleeding.  You feel that you are having a life-threatening or urgent problem. This information is not intended to replace advice given to you by your health care provider. Make sure you discuss any questions you have with your health care provider. Document Released: 08/02/2000 Document Revised: 09/29/2015 Document Reviewed: 04/10/2015 Elsevier Interactive Patient Education  [employer].

## 2017-06-27 NOTE — Anesthesia Procedure Notes (Signed)
Procedure Name: LMA Insertion Date/Time: 06/27/2017 10:48 AM Performed by: Raenette Rover, CRNA Pre-anesthesia Checklist: Patient identified, Emergency Drugs available, Suction available and Patient being monitored Patient Re-evaluated:Patient Re-evaluated prior to induction Oxygen Delivery Method: Circle system utilized Preoxygenation: Pre-oxygenation with 100% oxygen Induction Type: IV induction Ventilation: Mask ventilation without difficulty LMA: LMA inserted LMA Size: 4.0 Number of attempts: 1 Placement Confirmation: positive ETCO2,  CO2 detector and breath sounds checked- equal and bilateral Tube secured with: Tape Dental Injury: Teeth and Oropharynx as per pre-operative assessment

## 2017-06-27 NOTE — Anesthesia Preprocedure Evaluation (Signed)
Anesthesia Evaluation  Patient identified by MRN, date of birth, ID band Patient awake    Reviewed: Allergy & Precautions, NPO status , Patient's Chart, lab work & pertinent test results  Airway Mallampati: II  TM Distance: >3 FB Neck ROM: Full    Dental  (+) Teeth Intact   Pulmonary sleep apnea ,    breath sounds clear to auscultation       Cardiovascular hypertension, Pt. on medications + Peripheral Vascular Disease and +CHF   Rhythm:Regular Rate:Normal     Neuro/Psych PSYCHIATRIC DISORDERS Anxiety  Neuromuscular disease ( Diabetic peripheral neuropathy )    GI/Hepatic GERD  Medicated and Controlled,  Endo/Other  diabetes, Type 2, Insulin Dependent  Renal/GU Renal disease     Musculoskeletal  (+) Arthritis , Rheumatoid disorders,    Abdominal   Peds  Hematology  (+) anemia ,   Anesthesia Other Findings   Reproductive/Obstetrics                             Anesthesia Physical Anesthesia Plan  ASA: III  Anesthesia Plan: General   Post-op Pain Management:    Induction: Intravenous  PONV Risk Score and Plan:   Airway Management Planned: LMA  Additional Equipment:   Intra-op Plan:   Post-operative Plan: Extubation in OR  Informed Consent: I have reviewed the patients History and Physical, chart, labs and discussed the procedure including the risks, benefits and alternatives for the proposed anesthesia with the patient or authorized representative who has indicated his/her understanding and acceptance.     Plan Discussed with:   Anesthesia Plan Comments:         Anesthesia Quick Evaluation

## 2017-06-28 ENCOUNTER — Encounter (HOSPITAL_COMMUNITY): Payer: Self-pay | Admitting: Urology

## 2017-07-01 NOTE — Telephone Encounter (Signed)
This form was filled out thank you 

## 2017-07-01 NOTE — Telephone Encounter (Signed)
Please sign order for CPAP supplies & forward to [place] to be faxed in  In red folder in yellow box

## 2017-07-04 NOTE — Telephone Encounter (Signed)
Faxed order & filed °

## 2017-07-06 ENCOUNTER — Ambulatory Visit: Payer: Medicare Other | Admitting: Urology

## 2017-07-06 ENCOUNTER — Other Ambulatory Visit: Payer: Self-pay | Admitting: Family Medicine

## 2017-07-07 ENCOUNTER — Telehealth: Payer: Self-pay | Admitting: Family Medicine

## 2017-07-07 NOTE — Telephone Encounter (Signed)
Please sign & date CPAP supply order (detailed order from supplier) so I may fax  In red folder in yellow box

## 2017-07-11 NOTE — Telephone Encounter (Signed)
Faxed order & filed °

## 2017-07-11 NOTE — Telephone Encounter (Signed)
These were signed

## 2017-07-12 DIAGNOSIS — E113511 Type 2 diabetes mellitus with proliferative diabetic retinopathy with macular edema, right eye: Secondary | ICD-10-CM | POA: Diagnosis not present

## 2017-07-12 DIAGNOSIS — E113512 Type 2 diabetes mellitus with proliferative diabetic retinopathy with macular edema, left eye: Secondary | ICD-10-CM | POA: Diagnosis not present

## 2017-07-12 DIAGNOSIS — H43811 Vitreous degeneration, right eye: Secondary | ICD-10-CM | POA: Diagnosis not present

## 2017-07-12 DIAGNOSIS — H3563 Retinal hemorrhage, bilateral: Secondary | ICD-10-CM | POA: Diagnosis not present

## 2017-07-15 ENCOUNTER — Telehealth: Payer: Self-pay | Admitting: Family Medicine

## 2017-07-15 NOTE — Telephone Encounter (Signed)
Please sign & date the "corrected" CPAP order for pt & forward to [place] so that it can be faxed, documented, & filed  (in red folder, in yellow box)

## 2017-07-18 NOTE — Telephone Encounter (Signed)
Form was completed 

## 2017-07-19 ENCOUNTER — Telehealth: Payer: Self-pay | Admitting: Family Medicine

## 2017-07-19 NOTE — Telephone Encounter (Signed)
Requesting refill for mini pen needles to [facility] [address] in [place], [place].

## 2017-07-19 NOTE — Telephone Encounter (Signed)
Pt requests BD Ultrafine mini pen needles 65mm Rx written awaiting signature.

## 2017-07-19 NOTE — Telephone Encounter (Signed)
Rx faxed to the [facility] on [address] in [place].

## 2017-07-21 ENCOUNTER — Other Ambulatory Visit: Payer: Self-pay | Admitting: Urology

## 2017-07-21 DIAGNOSIS — N201 Calculus of ureter: Secondary | ICD-10-CM

## 2017-07-27 ENCOUNTER — Other Ambulatory Visit: Payer: Self-pay | Admitting: Family Medicine

## 2017-08-05 ENCOUNTER — Ambulatory Visit (HOSPITAL_COMMUNITY)
Admission: RE | Admit: 2017-08-05 | Discharge: 2017-08-05 | Disposition: A | Discharge status: Home / Self Care | Payer: Medicare Other | Source: Ambulatory Visit | Attending: Urology | Admitting: Urology

## 2017-08-05 DIAGNOSIS — I10 Essential (primary) hypertension: Secondary | ICD-10-CM | POA: Diagnosis not present

## 2017-08-05 DIAGNOSIS — Z87442 Personal history of urinary calculi: Secondary | ICD-10-CM | POA: Insufficient documentation

## 2017-08-05 DIAGNOSIS — N201 Calculus of ureter: Secondary | ICD-10-CM | POA: Diagnosis present

## 2017-08-05 DIAGNOSIS — E782 Mixed hyperlipidemia: Secondary | ICD-10-CM | POA: Diagnosis not present

## 2017-08-05 DIAGNOSIS — N281 Cyst of kidney, acquired: Secondary | ICD-10-CM | POA: Diagnosis not present

## 2017-08-05 DIAGNOSIS — Z79899 Other long term (current) drug therapy: Secondary | ICD-10-CM | POA: Diagnosis not present

## 2017-08-05 DIAGNOSIS — E1142 Type 2 diabetes mellitus with diabetic polyneuropathy: Secondary | ICD-10-CM | POA: Diagnosis not present

## 2017-08-06 LAB — HEPATIC FUNCTION PANEL
ALT: 14 IU/L (ref 0–32)
AST: 20 IU/L (ref 0–40)
Albumin: 4.1 g/dL (ref 3.5–4.7)
Alkaline Phosphatase: 64 IU/L (ref 39–117)
Bilirubin Total: 0.5 mg/dL (ref 0.0–1.2)
Bilirubin, Direct: 0.15 mg/dL (ref 0.00–0.40)
TOTAL PROTEIN: 6.6 g/dL (ref 6.0–8.5)

## 2017-08-06 LAB — LIPID PANEL
CHOL/HDL RATIO: 3 ratio (ref 0.0–4.4)
Cholesterol, Total: 136 mg/dL (ref 100–199)
HDL: 45 mg/dL (ref >39)
LDL CALC: 70 mg/dL (ref 0–99)
Triglycerides: 107 mg/dL (ref 0–149)
VLDL CHOLESTEROL CAL: 21 mg/dL (ref 5–40)

## 2017-08-06 LAB — BASIC METABOLIC PANEL
BUN/Creatinine Ratio: 29 — ABNORMAL HIGH (ref 12–28)
BUN: 22 mg/dL (ref 8–27)
CALCIUM: 8.9 mg/dL (ref 8.7–10.3)
CO2: 27 mmol/L (ref 20–29)
CREATININE: 0.75 mg/dL (ref 0.57–1.00)
Chloride: 99 mmol/L (ref 96–106)
GFR calc Af Amer: 87 mL/min/{1.73_m2} (ref >59)
GFR, EST NON AFRICAN AMERICAN: 76 mL/min/{1.73_m2} (ref >59)
GLUCOSE: 95 mg/dL (ref 65–99)
Potassium: 4.5 mmol/L (ref 3.5–5.2)
Sodium: 140 mmol/L (ref 134–144)

## 2017-08-06 LAB — MICROALBUMIN / CREATININE URINE RATIO
CREATININE, UR: 73.6 mg/dL
MICROALB/CREAT RATIO: 8.8 mg/g{creat} (ref 0.0–30.0)
MICROALBUM., U, RANDOM: 6.5 ug/mL

## 2017-08-06 LAB — HEMOGLOBIN A1C
ESTIMATED AVERAGE GLUCOSE: 171 mg/dL
Hgb A1c MFr Bld: 7.6 % — ABNORMAL HIGH (ref 4.8–5.6)

## 2017-08-09 ENCOUNTER — Encounter: Payer: Self-pay | Admitting: Family Medicine

## 2017-08-09 ENCOUNTER — Ambulatory Visit (INDEPENDENT_AMBULATORY_CARE_PROVIDER_SITE_OTHER): Payer: Medicare Other | Admitting: Family Medicine

## 2017-08-09 VITALS — BP 112/68 | Ht 62.0 in | Wt 211.1 lb

## 2017-08-09 DIAGNOSIS — E1142 Type 2 diabetes mellitus with diabetic polyneuropathy: Secondary | ICD-10-CM | POA: Diagnosis not present

## 2017-08-09 DIAGNOSIS — D3500 Benign neoplasm of unspecified adrenal gland: Secondary | ICD-10-CM | POA: Diagnosis not present

## 2017-08-09 DIAGNOSIS — Z6841 Body Mass Index (BMI) 40.0 and over, adult: Secondary | ICD-10-CM | POA: Diagnosis not present

## 2017-08-09 DIAGNOSIS — I700 Atherosclerosis of aorta: Secondary | ICD-10-CM | POA: Diagnosis not present

## 2017-08-09 DIAGNOSIS — I10 Essential (primary) hypertension: Secondary | ICD-10-CM

## 2017-08-09 NOTE — Progress Notes (Signed)
Subjective:    Patient ID: Shelly Jackson, female    DOB: 28-Nov-1934, 83 y.o.   MRN: [id number]  HPI Patient is here today to discuss a repeat Ct scan. States she had her labs drawn last week and wants to go over them.7.6 08/05/2017. She is on Lantus 48 units once QHS at 9pm. She eats healthy and exercises and sees a heart specialist. She states she has a hx of kidney stones and had them crsuhed three weeks ago by Urologist.  Falls-patient been having intermittent falls probably related to weak legs I did not evaluation of hearing in the office she has a difficult time standing difficult time walking but uses a cane she does not want any type of physical therapy  Foot pain-she relates intermittent foot pain she wears diabetic shoes she does see podiatry on a intermittent basis.  She tries to keep her diabetes under decent control.   Adrenal gland-abnormal on CAT scan back in October a follow-up CAT scan is recommended therefore we will be set her up for this she denies any abdominal pain  Morbid obesity issues she understands the importance of watching diet losing weight trying to cut back on portions  Aortic atherosclerosis she understands the importance of keeping cholesterol under good control  Diabetes subpar control she understands the importance of dietary measures plus also using her medications.  Review of Systems  Constitutional: Negative for activity change, appetite change and fatigue.  HENT: Negative for congestion and rhinorrhea.   Eyes: Negative for discharge.  Respiratory: Negative for cough, chest tightness and wheezing.   Cardiovascular: Negative for chest pain.  Gastrointestinal: Negative for abdominal pain, blood in stool and vomiting.  Endocrine: Negative for polyphagia.  Genitourinary: Negative for difficulty urinating and frequency.  Musculoskeletal: Negative for neck pain.  Skin: Negative for color change.  Allergic/Immunologic: Negative for environmental  allergies and food allergies.  Neurological: Negative for weakness and headaches.  Psychiatric/Behavioral: Negative for agitation and behavioral problems.       Objective:   Physical Exam  Constitutional: She is oriented to person, place, and time. She appears well-developed and well-nourished.  HENT:  Head: Normocephalic and atraumatic.  Right Ear: External ear normal.  Left Ear: External ear normal.  Eyes: Right eye exhibits no discharge. Left eye exhibits no discharge.  Neck: Normal range of motion. No tracheal deviation present.  Cardiovascular: Normal rate, regular rhythm, normal heart sounds and intact distal pulses. Exam reveals no gallop.  No murmur heard. Pulmonary/Chest: Effort normal and breath sounds normal. No stridor. No respiratory distress. She has no wheezes. She has no rales.  Abdominal: Soft. Bowel sounds are normal. She exhibits no distension and no mass. There is no tenderness. There is no rebound and no guarding.  Musculoskeletal: Normal range of motion. She exhibits no edema or tenderness.  Lymphadenopathy:    She has no cervical adenopathy.  Neurological: She is alert and oriented to person, place, and time. She exhibits normal muscle tone.  Skin: Skin is warm and dry.  Psychiatric: She has a normal mood and affect. Her behavior is normal.   Results for orders placed or performed during the hospital encounter of 06/27/17  Glucose, capillary  Result Value Ref Range   Glucose-Capillary 116 (H) 65 - 99 mg/dL  Glucose, capillary  Result Value Ref Range   Glucose-Capillary 104 (H) 65 - 99 mg/dL          Assessment & Plan:  Frequent falls-she uses a cane we did  discuss physical therapy she does not want to do this-this puts her at risk of severe injury  Diabetes subpar control she would do a better job checking her sugars frequently and sending [place] some readings-puts her at risk of stroke heart attack  Hyperlipidemia she continues take her cholesterol  medicine tolerates it well  Adrenal adenoma needs a follow-up scan this was 6 months ago need to make sure that this is not growing  Morbid obesity watch diet try to lose weight difficult to lose weight because of age Aortic atherosclerosis keep cholesterol under good control  Neuropathy of the feet follows up with Podiatry on a regular basis 25 minutes was spent with the patient.  This statement verifies that 25 minutes was indeed spent with the patient. Greater than half the time was spent in discussion, counseling and answering questions  regarding the issues that the patient came in for today as reflected in the diagnosis (s) please refer to documentation for further details.

## 2017-08-10 ENCOUNTER — Ambulatory Visit (INDEPENDENT_AMBULATORY_CARE_PROVIDER_SITE_OTHER): Payer: Medicare Other | Admitting: Urology

## 2017-08-10 DIAGNOSIS — N200 Calculus of kidney: Secondary | ICD-10-CM

## 2017-08-25 ENCOUNTER — Ambulatory Visit (HOSPITAL_COMMUNITY)
Admission: RE | Admit: 2017-08-25 | Discharge: 2017-08-25 | Disposition: A | Discharge status: Home / Self Care | Payer: Medicare Other | Source: Ambulatory Visit | Attending: Family Medicine | Admitting: Family Medicine

## 2017-08-25 ENCOUNTER — Other Ambulatory Visit: Payer: Self-pay | Admitting: Family Medicine

## 2017-08-25 DIAGNOSIS — D7389 Other diseases of spleen: Secondary | ICD-10-CM | POA: Insufficient documentation

## 2017-08-25 DIAGNOSIS — K5730 Diverticulosis of large intestine without perforation or abscess without bleeding: Secondary | ICD-10-CM | POA: Diagnosis not present

## 2017-08-25 DIAGNOSIS — D3500 Benign neoplasm of unspecified adrenal gland: Secondary | ICD-10-CM | POA: Diagnosis not present

## 2017-08-25 DIAGNOSIS — I700 Atherosclerosis of aorta: Secondary | ICD-10-CM | POA: Insufficient documentation

## 2017-09-08 ENCOUNTER — Other Ambulatory Visit: Payer: Self-pay | Admitting: Family Medicine

## 2017-09-09 NOTE — Telephone Encounter (Signed)
Six mo each ok

## 2017-09-12 ENCOUNTER — Other Ambulatory Visit: Payer: Self-pay | Admitting: Family Medicine

## 2017-09-12 ENCOUNTER — Telehealth: Payer: Self-pay | Admitting: *Deleted

## 2017-09-12 NOTE — Telephone Encounter (Signed)
Script written, awaiting signature.  

## 2017-09-12 NOTE — Telephone Encounter (Signed)
Patient called stating she is out of her one touch test strips, patient requesting a refill. Patient uses [facility]. Please advise

## 2017-09-13 NOTE — Telephone Encounter (Signed)
Attempted to fax to pharmacy yesterday multiple times, kept getting a busy notification. Contacted pt this morning and pt stated that she picked the test strips up yesterday

## 2017-10-17 DIAGNOSIS — E113511 Type 2 diabetes mellitus with proliferative diabetic retinopathy with macular edema, right eye: Secondary | ICD-10-CM | POA: Diagnosis not present

## 2017-10-20 ENCOUNTER — Telehealth: Payer: Self-pay | Admitting: Family Medicine

## 2017-10-20 ENCOUNTER — Other Ambulatory Visit: Payer: Self-pay | Admitting: *Deleted

## 2017-10-20 MED ORDER — HYDROXYZINE HCL 10 MG PO TABS: 10.0000 mg | ORAL_TABLET | Freq: Three times a day (TID) | ORAL | 0 refills | Status: DC | PRN

## 2017-10-20 NOTE — Telephone Encounter (Signed)
Discussed with pt. Pt wanted med called in. Sent to pharm. She will follow up if worse or not getting better

## 2017-10-20 NOTE — Telephone Encounter (Signed)
Itching started mid last week. Tried benadryl and vinegar with no relief. Itching from head to toe, no rash. No fever. Up all night itching.  [facility] [address] in [place]

## 2017-10-20 NOTE — Telephone Encounter (Signed)
Patient is itching from head to toe, with no rash or symptoms.  Has been taking benadryl with no relief.  Wants to know if we could call something in for this.  I told her she would probably need to be seen for this but she wanted me to put a phone message in.

## 2017-10-20 NOTE — Telephone Encounter (Signed)
Very difficult to know what is causing this without being seen.  If patient would like to have a prescription I recommend Atarax 25 mg 1 3 times daily as needed itching caution drowsiness #20-if persists follow-up office visit

## 2017-10-26 DIAGNOSIS — I119 Hypertensive heart disease without heart failure: Secondary | ICD-10-CM | POA: Diagnosis not present

## 2017-10-26 DIAGNOSIS — I2510 Atherosclerotic heart disease of native coronary artery without angina pectoris: Secondary | ICD-10-CM | POA: Diagnosis not present

## 2017-10-26 DIAGNOSIS — E785 Hyperlipidemia, unspecified: Secondary | ICD-10-CM | POA: Diagnosis not present

## 2017-10-26 DIAGNOSIS — I359 Nonrheumatic aortic valve disorder, unspecified: Secondary | ICD-10-CM | POA: Diagnosis not present

## 2017-10-26 DIAGNOSIS — E119 Type 2 diabetes mellitus without complications: Secondary | ICD-10-CM | POA: Diagnosis not present

## 2017-10-26 DIAGNOSIS — I10 Essential (primary) hypertension: Secondary | ICD-10-CM | POA: Diagnosis not present

## 2017-11-08 ENCOUNTER — Other Ambulatory Visit: Payer: Self-pay | Admitting: Family Medicine

## 2017-11-14 ENCOUNTER — Encounter: Payer: Self-pay | Admitting: Internal Medicine

## 2017-11-24 ENCOUNTER — Other Ambulatory Visit: Payer: Self-pay | Admitting: *Deleted

## 2017-11-25 MED ORDER — ALPRAZOLAM 0.5 MG PO TABS: ORAL_TABLET | ORAL | 1 refills | Status: DC

## 2017-11-25 NOTE — Telephone Encounter (Signed)
May have this, 90-day with 1 refill

## 2017-11-25 NOTE — Telephone Encounter (Signed)
[facility] pt 

## 2017-12-06 ENCOUNTER — Telehealth: Payer: Self-pay | Admitting: *Deleted

## 2017-12-06 NOTE — Telephone Encounter (Signed)
[facility]. Losartan/hctz 50-12.5 is currently out of stock. Pt requested an alternative. Last med check 08/09/17

## 2017-12-08 ENCOUNTER — Other Ambulatory Visit: Payer: Self-pay | Admitting: Family Medicine

## 2017-12-08 MED ORDER — VALSARTAN 40 MG PO TABS: ORAL_TABLET | ORAL | 0 refills | Status: DC

## 2017-12-08 MED ORDER — HYDROCHLOROTHIAZIDE 12.5 MG PO CAPS: ORAL_CAPSULE | ORAL | 0 refills | Status: DC

## 2017-12-08 NOTE — Telephone Encounter (Signed)
Spoke with pharmacist Alease Frame) at [facility]. Pharmacist states that there is no ETA for the losartan/hctz 50-12.5 mg at this time. Pharmacist stated that they will mail a letter to patient informing her of why these changes were made. I also called patient to let her know what was going on. New med placed. Pt verbalized understanding.

## 2017-12-08 NOTE — Telephone Encounter (Signed)
I recommend valsartan 40 mg 1 every morning, 90 I also recommend HCTZ 12.5 mg 1 every morning #90 Asked the pharmacy when to the expect the other medicine to be available? Make the changes in epic Make sure pharmacy tells the patient why she is having did make changes

## 2017-12-08 NOTE — Telephone Encounter (Signed)
Please forward the paper chart to me

## 2017-12-08 NOTE — Telephone Encounter (Signed)
Paper chart in provider office

## 2017-12-12 ENCOUNTER — Other Ambulatory Visit: Payer: Self-pay

## 2017-12-22 ENCOUNTER — Encounter: Payer: Self-pay | Admitting: Family Medicine

## 2017-12-22 ENCOUNTER — Ambulatory Visit (INDEPENDENT_AMBULATORY_CARE_PROVIDER_SITE_OTHER): Payer: Medicare Other | Admitting: Family Medicine

## 2017-12-22 ENCOUNTER — Ambulatory Visit (HOSPITAL_COMMUNITY)
Admission: RE | Admit: 2017-12-22 | Discharge: 2017-12-22 | Disposition: A | Discharge status: Home / Self Care | Payer: Medicare Other | Source: Ambulatory Visit | Attending: Family Medicine | Admitting: Family Medicine

## 2017-12-22 VITALS — BP 122/82 | Temp 98.4°F | Ht 62.0 in | Wt 211.4 lb

## 2017-12-22 DIAGNOSIS — R937 Abnormal findings on diagnostic imaging of other parts of musculoskeletal system: Secondary | ICD-10-CM | POA: Diagnosis not present

## 2017-12-22 DIAGNOSIS — R270 Ataxia, unspecified: Secondary | ICD-10-CM

## 2017-12-22 DIAGNOSIS — M25511 Pain in right shoulder: Secondary | ICD-10-CM | POA: Diagnosis not present

## 2017-12-22 DIAGNOSIS — E782 Mixed hyperlipidemia: Secondary | ICD-10-CM | POA: Diagnosis not present

## 2017-12-22 DIAGNOSIS — E1142 Type 2 diabetes mellitus with diabetic polyneuropathy: Secondary | ICD-10-CM | POA: Diagnosis not present

## 2017-12-22 DIAGNOSIS — Z79899 Other long term (current) drug therapy: Secondary | ICD-10-CM

## 2017-12-22 DIAGNOSIS — J019 Acute sinusitis, unspecified: Secondary | ICD-10-CM

## 2017-12-22 DIAGNOSIS — I10 Essential (primary) hypertension: Secondary | ICD-10-CM

## 2017-12-22 NOTE — Progress Notes (Signed)
   Subjective:    Patient ID: Shelly Jackson, female    DOB: 1935/03/10, 83 y.o.   MRN: [id number]  HPI  Patient having problems with pain in right shoulder and arm and side since fall at church- also fell at home. It is very difficult for the patient to get out The patient has difficult time getting in and out of the house because of weakness ataxia right shoulder pain from recent fall and from the fall earlier this spring  sttumbled into the door C/o right shoulder pain Into the neck and upper arm When fall occurred in march had bruising No loc No v      Patient also having sinus issues with headache and ear pain. Patient denies coughing up any drainage uses her CPAP machine states her breathing seems to be doing okay Review of Systems  Constitutional: Negative for activity change and fever.  HENT: Positive for congestion and rhinorrhea. Negative for ear pain.   Eyes: Negative for discharge.  Respiratory: Positive for cough. Negative for shortness of breath and wheezing.   Cardiovascular: Negative for chest pain and leg swelling.  Musculoskeletal: Positive for arthralgias. Negative for back pain.       Objective:   Physical Exam  Constitutional: She appears well-developed.  HENT:  Head: Normocephalic.  Nose: Nose normal.  Mouth/Throat: Oropharynx is clear and moist. No oropharyngeal exudate.  Neck: Neck supple.  Cardiovascular: Normal rate and normal heart sounds.  No murmur heard. Pulmonary/Chest: Effort normal and breath sounds normal. She has no wheezes.  Lymphadenopathy:    She has no cervical adenopathy.  Skin: Skin is warm and dry.  Nursing note and vitals reviewed. Shoulder pain discomfort in her right shoulder Decreased range of motion Tenderness in the shoulder area  Face to face was completed Needs home PT It would be very difficult for this patient to get back and forth to physical therapy because of her current condition hopefully Medicare will  approve her for a short course of home physical therapy Physical therapy has medically necessary in order to help her with shoulder range of motion as well as ataxia and minimizing the risk of falling    Assessment & Plan:  Ataxia Weakness Concern for increased risk of falls We will go ahead with consultation for home physical therapy Patient may use pain medicine when necessary but only infrequently To follow-up in several weeks for regular checkup

## 2018-01-02 DIAGNOSIS — H359 Unspecified retinal disorder: Secondary | ICD-10-CM | POA: Diagnosis not present

## 2018-01-02 DIAGNOSIS — H3563 Retinal hemorrhage, bilateral: Secondary | ICD-10-CM | POA: Diagnosis not present

## 2018-01-02 DIAGNOSIS — E113512 Type 2 diabetes mellitus with proliferative diabetic retinopathy with macular edema, left eye: Secondary | ICD-10-CM | POA: Diagnosis not present

## 2018-01-02 DIAGNOSIS — E113511 Type 2 diabetes mellitus with proliferative diabetic retinopathy with macular edema, right eye: Secondary | ICD-10-CM | POA: Diagnosis not present

## 2018-01-02 DIAGNOSIS — H43811 Vitreous degeneration, right eye: Secondary | ICD-10-CM | POA: Diagnosis not present

## 2018-01-05 DIAGNOSIS — G8911 Acute pain due to trauma: Secondary | ICD-10-CM | POA: Diagnosis not present

## 2018-01-05 DIAGNOSIS — M25511 Pain in right shoulder: Secondary | ICD-10-CM | POA: Diagnosis not present

## 2018-01-05 DIAGNOSIS — E1142 Type 2 diabetes mellitus with diabetic polyneuropathy: Secondary | ICD-10-CM | POA: Diagnosis not present

## 2018-01-05 DIAGNOSIS — R270 Ataxia, unspecified: Secondary | ICD-10-CM | POA: Diagnosis not present

## 2018-01-05 DIAGNOSIS — I10 Essential (primary) hypertension: Secondary | ICD-10-CM | POA: Diagnosis not present

## 2018-01-05 DIAGNOSIS — Z7984 Long term (current) use of oral hypoglycemic drugs: Secondary | ICD-10-CM | POA: Diagnosis not present

## 2018-01-10 DIAGNOSIS — G8911 Acute pain due to trauma: Secondary | ICD-10-CM | POA: Diagnosis not present

## 2018-01-10 DIAGNOSIS — R270 Ataxia, unspecified: Secondary | ICD-10-CM | POA: Diagnosis not present

## 2018-01-10 DIAGNOSIS — I10 Essential (primary) hypertension: Secondary | ICD-10-CM | POA: Diagnosis not present

## 2018-01-10 DIAGNOSIS — E1142 Type 2 diabetes mellitus with diabetic polyneuropathy: Secondary | ICD-10-CM | POA: Diagnosis not present

## 2018-01-10 DIAGNOSIS — M25511 Pain in right shoulder: Secondary | ICD-10-CM | POA: Diagnosis not present

## 2018-01-10 DIAGNOSIS — Z7984 Long term (current) use of oral hypoglycemic drugs: Secondary | ICD-10-CM | POA: Diagnosis not present

## 2018-01-12 DIAGNOSIS — R270 Ataxia, unspecified: Secondary | ICD-10-CM | POA: Diagnosis not present

## 2018-01-12 DIAGNOSIS — I10 Essential (primary) hypertension: Secondary | ICD-10-CM | POA: Diagnosis not present

## 2018-01-12 DIAGNOSIS — Z7984 Long term (current) use of oral hypoglycemic drugs: Secondary | ICD-10-CM | POA: Diagnosis not present

## 2018-01-12 DIAGNOSIS — G8911 Acute pain due to trauma: Secondary | ICD-10-CM | POA: Diagnosis not present

## 2018-01-12 DIAGNOSIS — E1142 Type 2 diabetes mellitus with diabetic polyneuropathy: Secondary | ICD-10-CM | POA: Diagnosis not present

## 2018-01-12 DIAGNOSIS — M25511 Pain in right shoulder: Secondary | ICD-10-CM | POA: Diagnosis not present

## 2018-01-13 DIAGNOSIS — I10 Essential (primary) hypertension: Secondary | ICD-10-CM | POA: Diagnosis not present

## 2018-01-13 DIAGNOSIS — M25511 Pain in right shoulder: Secondary | ICD-10-CM | POA: Diagnosis not present

## 2018-01-13 DIAGNOSIS — E1142 Type 2 diabetes mellitus with diabetic polyneuropathy: Secondary | ICD-10-CM | POA: Diagnosis not present

## 2018-01-13 DIAGNOSIS — R270 Ataxia, unspecified: Secondary | ICD-10-CM | POA: Diagnosis not present

## 2018-01-13 DIAGNOSIS — Z7984 Long term (current) use of oral hypoglycemic drugs: Secondary | ICD-10-CM | POA: Diagnosis not present

## 2018-01-13 DIAGNOSIS — G8911 Acute pain due to trauma: Secondary | ICD-10-CM | POA: Diagnosis not present

## 2018-01-17 ENCOUNTER — Other Ambulatory Visit: Payer: Self-pay | Admitting: Urology

## 2018-01-17 DIAGNOSIS — N200 Calculus of kidney: Secondary | ICD-10-CM

## 2018-01-17 DIAGNOSIS — E113512 Type 2 diabetes mellitus with proliferative diabetic retinopathy with macular edema, left eye: Secondary | ICD-10-CM | POA: Diagnosis not present

## 2018-01-17 DIAGNOSIS — E113511 Type 2 diabetes mellitus with proliferative diabetic retinopathy with macular edema, right eye: Secondary | ICD-10-CM | POA: Diagnosis not present

## 2018-01-18 DIAGNOSIS — R270 Ataxia, unspecified: Secondary | ICD-10-CM | POA: Diagnosis not present

## 2018-01-18 DIAGNOSIS — M25511 Pain in right shoulder: Secondary | ICD-10-CM | POA: Diagnosis not present

## 2018-01-18 DIAGNOSIS — G8911 Acute pain due to trauma: Secondary | ICD-10-CM | POA: Diagnosis not present

## 2018-01-18 DIAGNOSIS — Z7984 Long term (current) use of oral hypoglycemic drugs: Secondary | ICD-10-CM | POA: Diagnosis not present

## 2018-01-18 DIAGNOSIS — I10 Essential (primary) hypertension: Secondary | ICD-10-CM | POA: Diagnosis not present

## 2018-01-18 DIAGNOSIS — E1142 Type 2 diabetes mellitus with diabetic polyneuropathy: Secondary | ICD-10-CM | POA: Diagnosis not present

## 2018-01-19 ENCOUNTER — Ambulatory Visit (HOSPITAL_COMMUNITY)
Admission: RE | Admit: 2018-01-19 | Discharge: 2018-01-19 | Disposition: A | Discharge status: Home / Self Care | Payer: Medicare Other | Source: Ambulatory Visit | Attending: Urology | Admitting: Urology

## 2018-01-19 DIAGNOSIS — N281 Cyst of kidney, acquired: Secondary | ICD-10-CM | POA: Insufficient documentation

## 2018-01-19 DIAGNOSIS — N200 Calculus of kidney: Secondary | ICD-10-CM | POA: Diagnosis not present

## 2018-01-19 DIAGNOSIS — Z09 Encounter for follow-up examination after completed treatment for conditions other than malignant neoplasm: Secondary | ICD-10-CM | POA: Diagnosis not present

## 2018-01-19 DIAGNOSIS — I10 Essential (primary) hypertension: Secondary | ICD-10-CM | POA: Diagnosis not present

## 2018-01-19 DIAGNOSIS — Z87442 Personal history of urinary calculi: Secondary | ICD-10-CM | POA: Diagnosis not present

## 2018-01-19 DIAGNOSIS — G8911 Acute pain due to trauma: Secondary | ICD-10-CM | POA: Diagnosis not present

## 2018-01-19 DIAGNOSIS — E1142 Type 2 diabetes mellitus with diabetic polyneuropathy: Secondary | ICD-10-CM | POA: Diagnosis not present

## 2018-01-19 DIAGNOSIS — M25511 Pain in right shoulder: Secondary | ICD-10-CM | POA: Diagnosis not present

## 2018-01-19 DIAGNOSIS — R270 Ataxia, unspecified: Secondary | ICD-10-CM | POA: Diagnosis not present

## 2018-01-19 DIAGNOSIS — Z7984 Long term (current) use of oral hypoglycemic drugs: Secondary | ICD-10-CM | POA: Diagnosis not present

## 2018-01-20 DIAGNOSIS — Z7984 Long term (current) use of oral hypoglycemic drugs: Secondary | ICD-10-CM | POA: Diagnosis not present

## 2018-01-20 DIAGNOSIS — R270 Ataxia, unspecified: Secondary | ICD-10-CM | POA: Diagnosis not present

## 2018-01-20 DIAGNOSIS — M25511 Pain in right shoulder: Secondary | ICD-10-CM | POA: Diagnosis not present

## 2018-01-20 DIAGNOSIS — I10 Essential (primary) hypertension: Secondary | ICD-10-CM | POA: Diagnosis not present

## 2018-01-20 DIAGNOSIS — G8911 Acute pain due to trauma: Secondary | ICD-10-CM | POA: Diagnosis not present

## 2018-01-20 DIAGNOSIS — E1142 Type 2 diabetes mellitus with diabetic polyneuropathy: Secondary | ICD-10-CM | POA: Diagnosis not present

## 2018-01-23 DIAGNOSIS — I10 Essential (primary) hypertension: Secondary | ICD-10-CM | POA: Diagnosis not present

## 2018-01-23 DIAGNOSIS — Z7984 Long term (current) use of oral hypoglycemic drugs: Secondary | ICD-10-CM | POA: Diagnosis not present

## 2018-01-23 DIAGNOSIS — E1142 Type 2 diabetes mellitus with diabetic polyneuropathy: Secondary | ICD-10-CM | POA: Diagnosis not present

## 2018-01-23 DIAGNOSIS — R270 Ataxia, unspecified: Secondary | ICD-10-CM | POA: Diagnosis not present

## 2018-01-23 DIAGNOSIS — G8911 Acute pain due to trauma: Secondary | ICD-10-CM | POA: Diagnosis not present

## 2018-01-23 DIAGNOSIS — M25511 Pain in right shoulder: Secondary | ICD-10-CM | POA: Diagnosis not present

## 2018-01-24 ENCOUNTER — Telehealth: Payer: Self-pay | Admitting: Family Medicine

## 2018-01-24 DIAGNOSIS — E1142 Type 2 diabetes mellitus with diabetic polyneuropathy: Secondary | ICD-10-CM | POA: Diagnosis not present

## 2018-01-24 DIAGNOSIS — Z7984 Long term (current) use of oral hypoglycemic drugs: Secondary | ICD-10-CM | POA: Diagnosis not present

## 2018-01-24 DIAGNOSIS — M25511 Pain in right shoulder: Secondary | ICD-10-CM | POA: Diagnosis not present

## 2018-01-24 DIAGNOSIS — G8911 Acute pain due to trauma: Secondary | ICD-10-CM | POA: Diagnosis not present

## 2018-01-24 DIAGNOSIS — I10 Essential (primary) hypertension: Secondary | ICD-10-CM | POA: Diagnosis not present

## 2018-01-24 DIAGNOSIS — R270 Ataxia, unspecified: Secondary | ICD-10-CM | POA: Diagnosis not present

## 2018-01-24 NOTE — Telephone Encounter (Signed)
So noted thank you 

## 2018-01-24 NOTE — Telephone Encounter (Signed)
[facility] calling wanting to leave a message for Dr.Scott's nurse to inform them that she fell yesterday, no stated injuries just sore today. Any further questions [facility] contact number: [phone]

## 2018-01-25 DIAGNOSIS — E1142 Type 2 diabetes mellitus with diabetic polyneuropathy: Secondary | ICD-10-CM | POA: Diagnosis not present

## 2018-01-25 DIAGNOSIS — M25511 Pain in right shoulder: Secondary | ICD-10-CM | POA: Diagnosis not present

## 2018-01-25 DIAGNOSIS — I10 Essential (primary) hypertension: Secondary | ICD-10-CM | POA: Diagnosis not present

## 2018-01-25 DIAGNOSIS — Z7984 Long term (current) use of oral hypoglycemic drugs: Secondary | ICD-10-CM | POA: Diagnosis not present

## 2018-01-25 DIAGNOSIS — G8911 Acute pain due to trauma: Secondary | ICD-10-CM | POA: Diagnosis not present

## 2018-01-25 DIAGNOSIS — R270 Ataxia, unspecified: Secondary | ICD-10-CM | POA: Diagnosis not present

## 2018-01-26 DIAGNOSIS — I10 Essential (primary) hypertension: Secondary | ICD-10-CM | POA: Diagnosis not present

## 2018-01-26 DIAGNOSIS — E1142 Type 2 diabetes mellitus with diabetic polyneuropathy: Secondary | ICD-10-CM | POA: Diagnosis not present

## 2018-01-26 DIAGNOSIS — M25511 Pain in right shoulder: Secondary | ICD-10-CM | POA: Diagnosis not present

## 2018-01-26 DIAGNOSIS — G8911 Acute pain due to trauma: Secondary | ICD-10-CM | POA: Diagnosis not present

## 2018-01-30 DIAGNOSIS — M25511 Pain in right shoulder: Secondary | ICD-10-CM | POA: Diagnosis not present

## 2018-01-30 DIAGNOSIS — E1142 Type 2 diabetes mellitus with diabetic polyneuropathy: Secondary | ICD-10-CM | POA: Diagnosis not present

## 2018-01-30 DIAGNOSIS — I10 Essential (primary) hypertension: Secondary | ICD-10-CM | POA: Diagnosis not present

## 2018-01-30 DIAGNOSIS — R270 Ataxia, unspecified: Secondary | ICD-10-CM | POA: Diagnosis not present

## 2018-01-30 DIAGNOSIS — G8911 Acute pain due to trauma: Secondary | ICD-10-CM | POA: Diagnosis not present

## 2018-01-30 DIAGNOSIS — Z7984 Long term (current) use of oral hypoglycemic drugs: Secondary | ICD-10-CM | POA: Diagnosis not present

## 2018-01-31 ENCOUNTER — Telehealth: Payer: Self-pay | Admitting: Family Medicine

## 2018-01-31 NOTE — Telephone Encounter (Signed)
Pt states she is having ear pain, headache, right clavicle pain.Pt states that ear pain and throat pain is coming from shoulder.  Pt states she has been doing the exercises from last visit. Physical therapy comes out and helps her with exercises but patient states the exercises are painful. Please advise.

## 2018-01-31 NOTE — Telephone Encounter (Signed)
Patient is still having neck pain and wanting appointment thurs.or Friday. But we only have same days.

## 2018-01-31 NOTE — Telephone Encounter (Signed)
She may have appointment with myself or Ria Comment Thursday or Friday

## 2018-02-01 ENCOUNTER — Telehealth: Payer: Self-pay | Admitting: Family Medicine

## 2018-02-01 DIAGNOSIS — R270 Ataxia, unspecified: Secondary | ICD-10-CM | POA: Diagnosis not present

## 2018-02-01 DIAGNOSIS — G8911 Acute pain due to trauma: Secondary | ICD-10-CM | POA: Diagnosis not present

## 2018-02-01 DIAGNOSIS — E1142 Type 2 diabetes mellitus with diabetic polyneuropathy: Secondary | ICD-10-CM | POA: Diagnosis not present

## 2018-02-01 DIAGNOSIS — Z7984 Long term (current) use of oral hypoglycemic drugs: Secondary | ICD-10-CM | POA: Diagnosis not present

## 2018-02-01 DIAGNOSIS — I10 Essential (primary) hypertension: Secondary | ICD-10-CM | POA: Diagnosis not present

## 2018-02-01 DIAGNOSIS — M25511 Pain in right shoulder: Secondary | ICD-10-CM | POA: Diagnosis not present

## 2018-02-01 NOTE — Telephone Encounter (Signed)
Physical therapy nurse wanting you to know that patient fell yesterday at mail box but she is ok a little sore.She has appointment on Friday the 27 th

## 2018-02-01 NOTE — Telephone Encounter (Signed)
We will see her on Friday Obviously if she ever has a serious fall she will need to go to ER or urgent care

## 2018-02-03 ENCOUNTER — Ambulatory Visit (HOSPITAL_COMMUNITY)
Admission: RE | Admit: 2018-02-03 | Discharge: 2018-02-03 | Disposition: A | Discharge status: Home / Self Care | Payer: Medicare Other | Source: Ambulatory Visit | Attending: Family Medicine | Admitting: Family Medicine

## 2018-02-03 ENCOUNTER — Encounter: Payer: Self-pay | Admitting: Family Medicine

## 2018-02-03 ENCOUNTER — Ambulatory Visit (INDEPENDENT_AMBULATORY_CARE_PROVIDER_SITE_OTHER): Payer: Medicare Other | Admitting: Family Medicine

## 2018-02-03 VITALS — BP 130/82 | Ht 62.0 in | Wt 211.0 lb

## 2018-02-03 DIAGNOSIS — R270 Ataxia, unspecified: Secondary | ICD-10-CM | POA: Diagnosis not present

## 2018-02-03 DIAGNOSIS — M4802 Spinal stenosis, cervical region: Secondary | ICD-10-CM | POA: Diagnosis not present

## 2018-02-03 DIAGNOSIS — M542 Cervicalgia: Secondary | ICD-10-CM

## 2018-02-03 DIAGNOSIS — I10 Essential (primary) hypertension: Secondary | ICD-10-CM | POA: Diagnosis not present

## 2018-02-03 DIAGNOSIS — R0789 Other chest pain: Secondary | ICD-10-CM | POA: Insufficient documentation

## 2018-02-03 DIAGNOSIS — E1142 Type 2 diabetes mellitus with diabetic polyneuropathy: Secondary | ICD-10-CM | POA: Diagnosis not present

## 2018-02-03 DIAGNOSIS — G8911 Acute pain due to trauma: Secondary | ICD-10-CM | POA: Diagnosis not present

## 2018-02-03 DIAGNOSIS — Z7984 Long term (current) use of oral hypoglycemic drugs: Secondary | ICD-10-CM | POA: Diagnosis not present

## 2018-02-03 DIAGNOSIS — R079 Chest pain, unspecified: Secondary | ICD-10-CM | POA: Diagnosis not present

## 2018-02-03 DIAGNOSIS — M25511 Pain in right shoulder: Secondary | ICD-10-CM | POA: Diagnosis not present

## 2018-02-03 NOTE — Progress Notes (Signed)
   Subjective:    Patient ID: Shelly Jackson, female    DOB: June 26, 1934, 83 y.o.   MRN: [id number]  HPI Patient arrives with neck pain for a while. This patient having some intermittent neck pain also upper chest pain denies shoulder pain currently denies breathing issues denies chest pressure States it hurts to move her neck She also states she took a couple falls nothing serious but had significant contusions with it.  In addition to this she feels a little bit rundown but denies being depressed Patient tries to provide for herself   Review of Systems  Constitutional: Negative for activity change, appetite change and fatigue.  HENT: Negative for congestion and rhinorrhea.   Respiratory: Negative for cough and shortness of breath.   Cardiovascular: Negative for chest pain and leg swelling.  Gastrointestinal: Negative for abdominal pain and diarrhea.  Endocrine: Negative for polydipsia and polyphagia.  Skin: Negative for color change.  Neurological: Negative for dizziness and weakness.  Psychiatric/Behavioral: Negative for behavioral problems and confusion.       Objective:   Physical Exam  Constitutional: She appears well-nourished. No distress.  HENT:  Head: Normocephalic and atraumatic.  Eyes: Right eye exhibits no discharge. Left eye exhibits no discharge.  Neck: No tracheal deviation present.  Cardiovascular: Normal rate, regular rhythm and normal heart sounds.  No murmur heard. Pulmonary/Chest: Effort normal and breath sounds normal. No respiratory distress.  Musculoskeletal: She exhibits no edema.  Lymphadenopathy:    She has no cervical adenopathy.  Neurological: She is alert. Coordination normal.  Skin: Skin is warm and dry.  Psychiatric: She has a normal mood and affect. Her behavior is normal.  Vitals reviewed.  It is hard to discern at this point what is causing her upper chest discomfort she states there is a ache on the right side that radiates into the neck  she denies any cough wheezing fever chills sweats.   EKG no acute changes    Assessment & Plan:  Cervical neck pain Upper chest pain Noncardiac Chest x-ray neck x-ray ordered May need CT scan May use Tylenol as needed

## 2018-02-06 ENCOUNTER — Other Ambulatory Visit: Payer: Self-pay | Admitting: *Deleted

## 2018-02-06 ENCOUNTER — Other Ambulatory Visit: Payer: Self-pay | Admitting: Family Medicine

## 2018-02-06 DIAGNOSIS — R270 Ataxia, unspecified: Secondary | ICD-10-CM | POA: Diagnosis not present

## 2018-02-06 DIAGNOSIS — Z7984 Long term (current) use of oral hypoglycemic drugs: Secondary | ICD-10-CM | POA: Diagnosis not present

## 2018-02-06 DIAGNOSIS — E1142 Type 2 diabetes mellitus with diabetic polyneuropathy: Secondary | ICD-10-CM | POA: Diagnosis not present

## 2018-02-06 DIAGNOSIS — M25511 Pain in right shoulder: Secondary | ICD-10-CM | POA: Diagnosis not present

## 2018-02-06 DIAGNOSIS — G8911 Acute pain due to trauma: Secondary | ICD-10-CM | POA: Diagnosis not present

## 2018-02-06 DIAGNOSIS — I10 Essential (primary) hypertension: Secondary | ICD-10-CM | POA: Diagnosis not present

## 2018-02-06 MED ORDER — TRAMADOL HCL 50 MG PO TABS: 50.0000 mg | ORAL_TABLET | Freq: Three times a day (TID) | ORAL | 1 refills | Status: DC | PRN

## 2018-02-06 MED ORDER — TRAMADOL HCL 50 MG PO TABS: ORAL_TABLET | ORAL | 1 refills | Status: DC

## 2018-02-07 ENCOUNTER — Encounter: Payer: Self-pay | Admitting: Family Medicine

## 2018-02-07 DIAGNOSIS — I10 Essential (primary) hypertension: Secondary | ICD-10-CM | POA: Diagnosis not present

## 2018-02-07 DIAGNOSIS — G8911 Acute pain due to trauma: Secondary | ICD-10-CM | POA: Diagnosis not present

## 2018-02-07 DIAGNOSIS — R270 Ataxia, unspecified: Secondary | ICD-10-CM | POA: Diagnosis not present

## 2018-02-07 DIAGNOSIS — E1142 Type 2 diabetes mellitus with diabetic polyneuropathy: Secondary | ICD-10-CM | POA: Diagnosis not present

## 2018-02-07 DIAGNOSIS — M25511 Pain in right shoulder: Secondary | ICD-10-CM | POA: Diagnosis not present

## 2018-02-07 DIAGNOSIS — Z7984 Long term (current) use of oral hypoglycemic drugs: Secondary | ICD-10-CM | POA: Diagnosis not present

## 2018-02-09 DIAGNOSIS — G8911 Acute pain due to trauma: Secondary | ICD-10-CM | POA: Diagnosis not present

## 2018-02-09 DIAGNOSIS — E1142 Type 2 diabetes mellitus with diabetic polyneuropathy: Secondary | ICD-10-CM | POA: Diagnosis not present

## 2018-02-09 DIAGNOSIS — M25511 Pain in right shoulder: Secondary | ICD-10-CM | POA: Diagnosis not present

## 2018-02-09 DIAGNOSIS — I10 Essential (primary) hypertension: Secondary | ICD-10-CM | POA: Diagnosis not present

## 2018-02-09 DIAGNOSIS — Z7984 Long term (current) use of oral hypoglycemic drugs: Secondary | ICD-10-CM | POA: Diagnosis not present

## 2018-02-09 DIAGNOSIS — R270 Ataxia, unspecified: Secondary | ICD-10-CM | POA: Diagnosis not present

## 2018-02-13 DIAGNOSIS — E1142 Type 2 diabetes mellitus with diabetic polyneuropathy: Secondary | ICD-10-CM | POA: Diagnosis not present

## 2018-02-13 DIAGNOSIS — I10 Essential (primary) hypertension: Secondary | ICD-10-CM | POA: Diagnosis not present

## 2018-02-13 DIAGNOSIS — G8911 Acute pain due to trauma: Secondary | ICD-10-CM | POA: Diagnosis not present

## 2018-02-13 DIAGNOSIS — M25511 Pain in right shoulder: Secondary | ICD-10-CM | POA: Diagnosis not present

## 2018-02-13 DIAGNOSIS — Z7984 Long term (current) use of oral hypoglycemic drugs: Secondary | ICD-10-CM | POA: Diagnosis not present

## 2018-02-13 DIAGNOSIS — R270 Ataxia, unspecified: Secondary | ICD-10-CM | POA: Diagnosis not present

## 2018-02-14 DIAGNOSIS — Z7984 Long term (current) use of oral hypoglycemic drugs: Secondary | ICD-10-CM | POA: Diagnosis not present

## 2018-02-14 DIAGNOSIS — R270 Ataxia, unspecified: Secondary | ICD-10-CM | POA: Diagnosis not present

## 2018-02-14 DIAGNOSIS — I10 Essential (primary) hypertension: Secondary | ICD-10-CM | POA: Diagnosis not present

## 2018-02-14 DIAGNOSIS — M25511 Pain in right shoulder: Secondary | ICD-10-CM | POA: Diagnosis not present

## 2018-02-14 DIAGNOSIS — G8911 Acute pain due to trauma: Secondary | ICD-10-CM | POA: Diagnosis not present

## 2018-02-14 DIAGNOSIS — E1142 Type 2 diabetes mellitus with diabetic polyneuropathy: Secondary | ICD-10-CM | POA: Diagnosis not present

## 2018-02-15 ENCOUNTER — Ambulatory Visit (INDEPENDENT_AMBULATORY_CARE_PROVIDER_SITE_OTHER): Payer: Medicare Other | Admitting: Urology

## 2018-02-15 DIAGNOSIS — R3912 Poor urinary stream: Secondary | ICD-10-CM | POA: Diagnosis not present

## 2018-02-15 DIAGNOSIS — G8911 Acute pain due to trauma: Secondary | ICD-10-CM | POA: Diagnosis not present

## 2018-02-15 DIAGNOSIS — Z7984 Long term (current) use of oral hypoglycemic drugs: Secondary | ICD-10-CM | POA: Diagnosis not present

## 2018-02-15 DIAGNOSIS — I10 Essential (primary) hypertension: Secondary | ICD-10-CM | POA: Diagnosis not present

## 2018-02-15 DIAGNOSIS — M25511 Pain in right shoulder: Secondary | ICD-10-CM | POA: Diagnosis not present

## 2018-02-15 DIAGNOSIS — R270 Ataxia, unspecified: Secondary | ICD-10-CM | POA: Diagnosis not present

## 2018-02-15 DIAGNOSIS — E1142 Type 2 diabetes mellitus with diabetic polyneuropathy: Secondary | ICD-10-CM | POA: Diagnosis not present

## 2018-02-15 DIAGNOSIS — N200 Calculus of kidney: Secondary | ICD-10-CM | POA: Diagnosis not present

## 2018-03-01 ENCOUNTER — Ambulatory Visit (INDEPENDENT_AMBULATORY_CARE_PROVIDER_SITE_OTHER): Payer: Medicare Other | Admitting: Family Medicine

## 2018-03-01 ENCOUNTER — Encounter: Payer: Self-pay | Admitting: Family Medicine

## 2018-03-01 VITALS — BP 124/78 | Ht 62.0 in | Wt 211.8 lb

## 2018-03-01 DIAGNOSIS — E133399 Other specified diabetes mellitus with moderate nonproliferative diabetic retinopathy without macular edema, unspecified eye: Secondary | ICD-10-CM

## 2018-03-01 DIAGNOSIS — I10 Essential (primary) hypertension: Secondary | ICD-10-CM | POA: Diagnosis not present

## 2018-03-01 DIAGNOSIS — I700 Atherosclerosis of aorta: Secondary | ICD-10-CM

## 2018-03-01 DIAGNOSIS — E1142 Type 2 diabetes mellitus with diabetic polyneuropathy: Secondary | ICD-10-CM | POA: Diagnosis not present

## 2018-03-01 DIAGNOSIS — M542 Cervicalgia: Secondary | ICD-10-CM

## 2018-03-01 DIAGNOSIS — Z23 Encounter for immunization: Secondary | ICD-10-CM

## 2018-03-01 MED ORDER — LOSARTAN POTASSIUM-HCTZ 50-12.5 MG PO TABS: ORAL_TABLET | ORAL | 1 refills | Status: DC

## 2018-03-01 MED ORDER — TRAMADOL HCL 50 MG PO TABS: ORAL_TABLET | ORAL | 2 refills | Status: DC

## 2018-03-01 MED ORDER — NIFEDIPINE ER OSMOTIC RELEASE 60 MG PO TB24: 60.0000 mg | ORAL_TABLET | Freq: Every day | ORAL | 1 refills | Status: DC

## 2018-03-01 MED ORDER — ALPRAZOLAM 0.5 MG PO TABS: ORAL_TABLET | ORAL | 1 refills | Status: DC

## 2018-03-01 MED ORDER — ATORVASTATIN CALCIUM 20 MG PO TABS: 20.0000 mg | ORAL_TABLET | Freq: Every day | ORAL | 1 refills | Status: DC

## 2018-03-01 MED ORDER — OXYBUTYNIN CHLORIDE ER 5 MG PO TB24: 5.0000 mg | ORAL_TABLET | Freq: Every day | ORAL | 1 refills | Status: DC

## 2018-03-01 MED ORDER — METFORMIN HCL 1000 MG PO TABS: ORAL_TABLET | ORAL | 1 refills | Status: DC

## 2018-03-01 MED ORDER — INSULIN GLARGINE 100 UNIT/ML SOLOSTAR PEN: PEN_INJECTOR | SUBCUTANEOUS | 1 refills | Status: DC

## 2018-03-01 NOTE — Progress Notes (Signed)
   Subjective:    Patient ID: Shelly Jackson, female    DOB: Jun 20, 1934, 83 y.o.   MRN: [id number]  HPI  Patient arrives for follow up on neck pain. Patient states she is feeling some better with the medication she was given. Patient relates neck is somewhat better she states that medications seem to help she denies any problems with the medicine she relates some neck pain goes into the shoulders to some degree denies any other particular troubles.  Please see discussion below Review of Systems  Constitutional: Negative for activity change and appetite change.  HENT: Negative for congestion and rhinorrhea.   Respiratory: Negative for cough and shortness of breath.   Cardiovascular: Negative for chest pain and leg swelling.  Gastrointestinal: Negative for abdominal pain, nausea and vomiting.  Skin: Negative for color change.  Neurological: Negative for dizziness and weakness.  Psychiatric/Behavioral: Negative for agitation and confusion.       Objective:   Physical Exam  Constitutional: She appears well-nourished. No distress.  HENT:  Head: Normocephalic.  Cardiovascular: Normal rate, regular rhythm and normal heart sounds.  No murmur heard. Pulmonary/Chest: Effort normal and breath sounds normal.  Musculoskeletal: She exhibits no edema.  Lymphadenopathy:    She has no cervical adenopathy.  Neurological: She is alert.  Psychiatric: Her behavior is normal.  Vitals reviewed.         Assessment & Plan:  The patient was seen today as part of a comprehensive visit for diabetes. The importance of keeping her A1c at or below 7 was discussed.  Importance of regular physical activity was discussed.   The importance of adherence to medication as well as a controlled low starch/sugar diet was also discussed.  Standard follow-up visit recommended.  Also patient aware failure to keep diabetes under control increases the risk of complications.  HTN- Patient was seen today as part of a  visit regarding hypertension. The importance of healthy diet and regular physical activity was discussed. The importance of compliance with medications discussed.  Ideal goal is to keep blood pressure low elevated levels certainly below 250/53 when possible.  The patient was counseled that keeping blood pressure under control lessen his risk of complications.  The importance of regular follow-ups was discussed with the patient.  Low-salt diet such as DASH recommended.  Regular physical activity was recommended as well.  Patient was advised to keep regular follow-ups.  The patient was seen today as part of an evaluation regarding hyperlipidemia.  Recent lab work has been reviewed with the patient as well as the goals for good cholesterol care.  In addition to this medications have been discussed the importance of compliance with diet and medications discussed as well.  Finally the patient is aware that poor control of cholesterol, noncompliance can dramatically increase the risk of complications. The patient will keep regular office visits and the patient does agreed to periodic lab work.  Cervical neck pain actually doing better after trying some Tylenol and range of motion exercises x-rays reviewed with patient in detail  Aortic atherosclerosis best way to keep this under control keep cholesterol under good control diabetes under control blood pressure under control  Diabetic retinopathy followed by specialist on a regular basis

## 2018-03-02 DIAGNOSIS — Z79899 Other long term (current) drug therapy: Secondary | ICD-10-CM | POA: Diagnosis not present

## 2018-03-02 DIAGNOSIS — E782 Mixed hyperlipidemia: Secondary | ICD-10-CM | POA: Diagnosis not present

## 2018-03-02 DIAGNOSIS — E1142 Type 2 diabetes mellitus with diabetic polyneuropathy: Secondary | ICD-10-CM | POA: Diagnosis not present

## 2018-03-03 LAB — BASIC METABOLIC PANEL
BUN/Creatinine Ratio: 23 (ref 12–28)
BUN: 16 mg/dL (ref 8–27)
CHLORIDE: 102 mmol/L (ref 96–106)
CO2: 26 mmol/L (ref 20–29)
Calcium: 8.8 mg/dL (ref 8.7–10.3)
Creatinine, Ser: 0.69 mg/dL (ref 0.57–1.00)
GFR calc Af Amer: 94 mL/min/{1.73_m2} (ref >59)
GFR calc non Af Amer: 82 mL/min/{1.73_m2} (ref >59)
Glucose: 108 mg/dL — ABNORMAL HIGH (ref 65–99)
POTASSIUM: 3.6 mmol/L (ref 3.5–5.2)
Sodium: 141 mmol/L (ref 134–144)

## 2018-03-03 LAB — HEMOGLOBIN A1C
ESTIMATED AVERAGE GLUCOSE: 163 mg/dL
HEMOGLOBIN A1C: 7.3 % — AB (ref 4.8–5.6)

## 2018-03-03 LAB — HEPATIC FUNCTION PANEL
ALT: 12 IU/L (ref 0–32)
AST: 17 IU/L (ref 0–40)
Albumin: 4 g/dL (ref 3.5–4.7)
Alkaline Phosphatase: 54 IU/L (ref 39–117)
Bilirubin Total: 0.5 mg/dL (ref 0.0–1.2)
Bilirubin, Direct: 0.18 mg/dL (ref 0.00–0.40)
TOTAL PROTEIN: 6.3 g/dL (ref 6.0–8.5)

## 2018-03-03 LAB — LIPID PANEL
CHOLESTEROL TOTAL: 109 mg/dL (ref 100–199)
Chol/HDL Ratio: 2.7 ratio (ref 0.0–4.4)
HDL: 40 mg/dL (ref >39)
LDL CALC: 53 mg/dL (ref 0–99)
Triglycerides: 78 mg/dL (ref 0–149)
VLDL CHOLESTEROL CAL: 16 mg/dL (ref 5–40)

## 2018-03-04 ENCOUNTER — Encounter: Payer: Self-pay | Admitting: Family Medicine

## 2018-03-06 ENCOUNTER — Other Ambulatory Visit: Payer: Self-pay | Admitting: *Deleted

## 2018-03-06 MED ORDER — GLUCOSE BLOOD VI STRP: ORAL_STRIP | 1 refills | Status: DC

## 2018-03-22 ENCOUNTER — Encounter: Payer: Self-pay | Admitting: Family Medicine

## 2018-03-22 DIAGNOSIS — I10 Essential (primary) hypertension: Secondary | ICD-10-CM | POA: Diagnosis not present

## 2018-03-22 DIAGNOSIS — S098XXA Other specified injuries of head, initial encounter: Secondary | ICD-10-CM | POA: Diagnosis not present

## 2018-03-22 DIAGNOSIS — E119 Type 2 diabetes mellitus without complications: Secondary | ICD-10-CM | POA: Diagnosis not present

## 2018-03-22 DIAGNOSIS — R58 Hemorrhage, not elsewhere classified: Secondary | ICD-10-CM | POA: Diagnosis not present

## 2018-03-22 DIAGNOSIS — S0003XA Contusion of scalp, initial encounter: Secondary | ICD-10-CM | POA: Diagnosis not present

## 2018-03-22 DIAGNOSIS — W1781XA Fall down embankment (hill), initial encounter: Secondary | ICD-10-CM | POA: Diagnosis not present

## 2018-03-22 DIAGNOSIS — W19XXXA Unspecified fall, initial encounter: Secondary | ICD-10-CM | POA: Diagnosis not present

## 2018-03-22 DIAGNOSIS — I509 Heart failure, unspecified: Secondary | ICD-10-CM | POA: Diagnosis not present

## 2018-03-24 ENCOUNTER — Telehealth: Payer: Self-pay | Admitting: Family Medicine

## 2018-03-24 NOTE — Telephone Encounter (Signed)
Patient fell yesterday down a hill with a cut on head went to ER in [place] but they didn't even bandage her head.She wanting to know what she can put on it to keep it clean and what she could take for her headache has appointment on Tuesday for [facility] follow up with Dr.Scott.

## 2018-03-24 NOTE — Telephone Encounter (Signed)
autun give her advice plz

## 2018-03-24 NOTE — Telephone Encounter (Signed)
good

## 2018-03-24 NOTE — Telephone Encounter (Signed)
Patient states she fell while at the [facility] in [place] yesterday. She say the took her to [facility] in [place] and they took an x ray of her head and sent her home.She says they did not clean the wound. She is not having any headaches and she is not running a fever. She is cleaning it with peroxide and that is it. She has an appt next week with Dr. Nicki Reaper and wants to know what she can do to clean it. She is taking Tylenol to keep pain away.Please advise.

## 2018-03-24 NOTE — Telephone Encounter (Signed)
Patient advised of proper wound care and warning signs. Patient advised to return to ER if any warning signs develop or if she feels worse.

## 2018-03-28 ENCOUNTER — Ambulatory Visit (INDEPENDENT_AMBULATORY_CARE_PROVIDER_SITE_OTHER): Payer: Medicare Other | Admitting: Family Medicine

## 2018-03-28 ENCOUNTER — Encounter: Payer: Self-pay | Admitting: Family Medicine

## 2018-03-28 VITALS — BP 136/82 | Ht 62.0 in | Wt 207.8 lb

## 2018-03-28 DIAGNOSIS — R51 Headache: Secondary | ICD-10-CM

## 2018-03-28 DIAGNOSIS — W01198A Fall on same level from slipping, tripping and stumbling with subsequent striking against other object, initial encounter: Secondary | ICD-10-CM | POA: Diagnosis not present

## 2018-03-28 DIAGNOSIS — S0003XD Contusion of scalp, subsequent encounter: Secondary | ICD-10-CM

## 2018-03-28 NOTE — Progress Notes (Signed)
   Subjective:    Patient ID: Shelly Jackson, female    DOB: 12-03-1934, 83 y.o.   MRN: [id number]  HPI  Patient arrives for a follow up on a recent ER visit for a fall. Patient states she was started by the back of her Lucianne Lei coming up fast and fell backwards and someone tried to catch her and fell with her. Patient went to ER in [place].  She relates significant headache and neck pain after this injury she went to the ER they did a CAT scan of her head and neck they told her she had some arthritis and a bruise on her head no sign of bleeding no sign of stroke denies any other problems.  PMH benign  Review of Systems  Constitutional: Negative for activity change and appetite change.  HENT: Negative for congestion and rhinorrhea.   Respiratory: Negative for cough and shortness of breath.   Cardiovascular: Negative for chest pain and leg swelling.  Gastrointestinal: Negative for abdominal pain, nausea and vomiting.  Musculoskeletal: Positive for arthralgias and back pain.  Skin: Negative for color change.  Neurological: Negative for dizziness and weakness.  Psychiatric/Behavioral: Negative for agitation and confusion.       Objective:   Physical Exam  Constitutional: She appears well-nourished. No distress.  HENT:  Head: Normocephalic.  Cardiovascular: Normal rate, regular rhythm and normal heart sounds.  No murmur heard. Pulmonary/Chest: Effort normal and breath sounds normal.  Musculoskeletal: She exhibits no edema.  Lymphadenopathy:    She has no cervical adenopathy.  Neurological: She is alert.  Psychiatric: Her behavior is normal.  Vitals reviewed.         Assessment & Plan:  Patient with a concussion in addition this patient has small cut on her scalp healing well We will get the copies of the CAT scan report to review over she signed a release so the [facility] ER will send it to [place] she currently is functioning well but I told her to use either a walker or cane with all  of her walking she will follow-up by springtime for her diabetes sooner if any problems

## 2018-03-29 ENCOUNTER — Other Ambulatory Visit: Payer: Self-pay

## 2018-04-10 ENCOUNTER — Telehealth: Payer: Self-pay | Admitting: Family Medicine

## 2018-04-10 NOTE — Telephone Encounter (Signed)
I recommend losartan 50 mg, #90, 3 refills, 1 daily HCTZ 12.5 mg, #90, 1 every morning, 3 refills  Please make changes in epic please send the prescriptions in for the patient thank you

## 2018-04-10 NOTE — Telephone Encounter (Signed)
This is the pt Autumn spoke with you about and you requested it be resent to you.

## 2018-04-10 NOTE — Telephone Encounter (Signed)
Pharmacy requesting alternate to Losartan HCTZ 50-12.5mg . Pharmacy states that this med is currently out of stock. Pharmacy states that an alternative can be sent in or it can be called to a local pharmacy but patient may need to pay.

## 2018-04-10 NOTE — Telephone Encounter (Signed)
it will be necessary to call her pharmacy  is losartan by itself in stock?  Is valsartan in stock?  Thanks

## 2018-04-11 MED ORDER — HYDROCHLOROTHIAZIDE 12.5 MG PO CAPS: 12.5000 mg | ORAL_CAPSULE | Freq: Every day | ORAL | 3 refills | Status: DC

## 2018-04-11 MED ORDER — LOSARTAN POTASSIUM 50 MG PO TABS: 50.0000 mg | ORAL_TABLET | Freq: Every day | ORAL | 3 refills | Status: DC

## 2018-04-11 NOTE — Telephone Encounter (Signed)
Patient is aware 

## 2018-04-11 NOTE — Addendum Note (Signed)
Addended by: Karle Barr on: 04/11/2018 08:59 AM   Modules accepted: Orders

## 2018-04-29 ENCOUNTER — Other Ambulatory Visit: Payer: Self-pay | Admitting: Family Medicine

## 2018-05-01 NOTE — Telephone Encounter (Signed)
May have this +1 refill 

## 2018-05-16 DIAGNOSIS — E113512 Type 2 diabetes mellitus with proliferative diabetic retinopathy with macular edema, left eye: Secondary | ICD-10-CM | POA: Diagnosis not present

## 2018-05-16 DIAGNOSIS — E113511 Type 2 diabetes mellitus with proliferative diabetic retinopathy with macular edema, right eye: Secondary | ICD-10-CM | POA: Diagnosis not present

## 2018-05-16 DIAGNOSIS — H43811 Vitreous degeneration, right eye: Secondary | ICD-10-CM | POA: Diagnosis not present

## 2018-06-05 ENCOUNTER — Telehealth: Payer: Self-pay | Admitting: Family Medicine

## 2018-06-05 NOTE — Telephone Encounter (Signed)
Pharmacy requesting refill on Xanax 0.5mg  tablet. Take one tablet by mouth at bedtime prn sleep

## 2018-06-06 MED ORDER — ALPRAZOLAM 0.25 MG PO TABS: ORAL_TABLET | ORAL | 1 refills | Status: DC

## 2018-06-06 NOTE — Telephone Encounter (Signed)
I recommend this be reduced Xanax 0 2.5 mg #90 with 1 refill please see request fax

## 2018-06-06 NOTE — Addendum Note (Signed)
Addended by: Karle Barr on: 06/06/2018 08:17 AM   Modules accepted: Orders

## 2018-06-09 ENCOUNTER — Telehealth: Payer: Self-pay | Admitting: Family Medicine

## 2018-06-09 NOTE — Telephone Encounter (Signed)
Fax from [facility] needing clarification on which strength pt is to be on. Provider informed that Xanax 0.25 would be safer.  Contacted patient to inform her that Xanax was because decreased from 0.5 to 0.25 mg take one by mouth at bedtime as needed. Informed patient that this was safer/less risk of night time falls. Pt verbalized understanding.

## 2018-06-10 DIAGNOSIS — R197 Diarrhea, unspecified: Secondary | ICD-10-CM | POA: Diagnosis not present

## 2018-06-10 DIAGNOSIS — R109 Unspecified abdominal pain: Secondary | ICD-10-CM | POA: Diagnosis not present

## 2018-06-11 ENCOUNTER — Telehealth: Payer: Self-pay | Admitting: Family Medicine

## 2018-06-11 NOTE — Telephone Encounter (Signed)
Apparently [facility] already sent out the 0.5 mg nightly Xanax to the patient at the end of January On the next prescription they will send out the 0.25 in 3 months Nurses-please talk with the patient It is in the patient's best interest to reduce the Xanax to reduce the risk of falls Therefore I recommend that she reduce the evening Xanax the 0.5 mg tablet that she takes she ought to reduce it to a half a tablet each evening to lessen the risk of accidental falls  Please inform the patient her new prescriptions in the future will be the 0.25 mg

## 2018-06-12 ENCOUNTER — Telehealth: Payer: Self-pay | Admitting: Family Medicine

## 2018-06-12 MED ORDER — AMOXICILLIN 500 MG PO CAPS: 500.0000 mg | ORAL_CAPSULE | Freq: Three times a day (TID) | ORAL | 0 refills | Status: DC

## 2018-06-12 NOTE — Telephone Encounter (Signed)
Prescription sent electronically to pharmacy. Patient advised to stop Reglan and start Amoxil and we are more than happy to recheck her and if she would like to schedule a follow-up this week If patient starts having high fevers difficulty breathing or worse needs to get seen immediately. Patient verbalized understanding.

## 2018-06-12 NOTE — Telephone Encounter (Signed)
1.  Stop Reglan #2 amoxicillin 500 mg 1 take 3 times daily #30 3.  Please let the patient know we are more than happy to recheck her and if she would like to schedule a follow-up this week I will be happy to see her If patient starts having high fevers difficulty breathing or worse needs to get seen immediately

## 2018-06-12 NOTE — Telephone Encounter (Signed)
Pt was seen by a doctor Saturday. They done an abd xray and prescribed her mitcocloruance she has been taking it for 3 days and it is not helping.   She is wanting to know if Dr. Nicki Reaper would call her something in. She states she is having a head cold, headache, and nauseous when eating x 1 week.

## 2018-06-12 NOTE — Telephone Encounter (Signed)
Patient states she saw dr at urgent care Saturday for head congestion and cough with nausea- head congestion is terrible  They x rayed her stomach and gave her Reglan 4 times a day but that is not helping at all- they did not give her an antibiotic or anything for her cold and she said the nausea is from drainage not a stomach issue and would like something called in - please advise

## 2018-06-13 NOTE — Telephone Encounter (Signed)
Left message to return call 

## 2018-06-16 NOTE — Telephone Encounter (Signed)
Left message to return call 

## 2018-06-20 ENCOUNTER — Other Ambulatory Visit: Payer: Self-pay | Admitting: Family Medicine

## 2018-06-20 NOTE — Telephone Encounter (Signed)
Pt has been notified.

## 2018-06-28 ENCOUNTER — Telehealth: Payer: Self-pay | Admitting: Family Medicine

## 2018-06-28 NOTE — Telephone Encounter (Signed)
[facility] for pt  Pt needs OV to discuss use & benefit of CPAP therapy so that OV note can be sent with order for supplies to [facility] (OV note has to be within last 6 months)  (fax rec'd from [facility] needing order for CPAP supplies, OV note showing pt's use & benefit)

## 2018-07-03 NOTE — Telephone Encounter (Signed)
Spoke with pt, states will discuss at her March OV with Dr. Nicki Reaper

## 2018-07-03 NOTE — Telephone Encounter (Signed)
Pt LMOVM for me, returned my call on 2/21  [facility] - need to schedule OV to discuss use & benefit of CPAP so the OV note can be sent with order for CPAP supplies

## 2018-07-20 ENCOUNTER — Other Ambulatory Visit: Payer: Self-pay | Admitting: Family Medicine

## 2018-07-20 DIAGNOSIS — E785 Hyperlipidemia, unspecified: Secondary | ICD-10-CM | POA: Diagnosis not present

## 2018-07-20 DIAGNOSIS — I2510 Atherosclerotic heart disease of native coronary artery without angina pectoris: Secondary | ICD-10-CM | POA: Diagnosis not present

## 2018-07-20 DIAGNOSIS — I359 Nonrheumatic aortic valve disorder, unspecified: Secondary | ICD-10-CM | POA: Diagnosis not present

## 2018-07-20 DIAGNOSIS — I119 Hypertensive heart disease without heart failure: Secondary | ICD-10-CM | POA: Diagnosis not present

## 2018-07-26 DIAGNOSIS — R0602 Shortness of breath: Secondary | ICD-10-CM | POA: Diagnosis not present

## 2018-07-26 DIAGNOSIS — I359 Nonrheumatic aortic valve disorder, unspecified: Secondary | ICD-10-CM | POA: Diagnosis not present

## 2018-08-07 ENCOUNTER — Ambulatory Visit: Payer: Medicare Other | Admitting: Family Medicine

## 2018-08-10 ENCOUNTER — Encounter: Payer: Self-pay | Admitting: Family Medicine

## 2018-08-10 ENCOUNTER — Other Ambulatory Visit: Payer: Self-pay | Admitting: Family Medicine

## 2018-08-10 ENCOUNTER — Ambulatory Visit (INDEPENDENT_AMBULATORY_CARE_PROVIDER_SITE_OTHER): Payer: Medicare Other | Admitting: Family Medicine

## 2018-08-10 ENCOUNTER — Other Ambulatory Visit: Payer: Self-pay

## 2018-08-10 DIAGNOSIS — E782 Mixed hyperlipidemia: Secondary | ICD-10-CM | POA: Diagnosis not present

## 2018-08-10 DIAGNOSIS — R197 Diarrhea, unspecified: Secondary | ICD-10-CM | POA: Diagnosis not present

## 2018-08-10 DIAGNOSIS — E1142 Type 2 diabetes mellitus with diabetic polyneuropathy: Secondary | ICD-10-CM | POA: Diagnosis not present

## 2018-08-10 DIAGNOSIS — Z79899 Other long term (current) drug therapy: Secondary | ICD-10-CM

## 2018-08-10 DIAGNOSIS — I10 Essential (primary) hypertension: Secondary | ICD-10-CM | POA: Diagnosis not present

## 2018-08-10 NOTE — Progress Notes (Signed)
Labs orders placed and mailed to patient with instructions to not have lab work done until mid July.

## 2018-08-10 NOTE — Progress Notes (Signed)
   Subjective:    Patient ID: Shelly Jackson, female    DOB: [birth year], 84 y.o.   MRN: [id number]  Diabetes  She presents for her follow-up diabetic visit. She has type 2 diabetes mellitus. She is following a generally healthy diet. Exercise: walks around the house inside. Home blood sugar record trend: under 140 mostly around 100. She does not see a podiatrist.Eye exam is current (3 months ago. due to go back in may).  No low sugar spells sugars been under good control continue current measures.  Patient doing well with her other medicines.  All of these were reviewed in detail and updates were sent to her pharmacy Diarrhea for 2 months off and on. No fever, has changed diet. 4 -5 times a day until she gets it under control then goes back to her regular bm daily. No blood or mucus in stool.  Requesting refill on amoxil that she got in February.  We discussed this at length amoxicillin really would not help her situation it is just intermittent diarrhea I recommend over-the-counter antidiarrhea measures and nausea medicine when necessary  Discuss use and benefit of cpap ( required for RX of supplies) No trouble with cpap. States she sleeps good with cpap.  We did discuss a CPAP it is helping her.  We will provide her a prescription for the supplies.  Patient uses it on a regular basis.  Virtual Visit via Telephone Note Patient did not have video capability I connected with [name] on 08/10/18 at 10:00 AM EDT by telephone and verified that I am speaking with the correct person using two identifiers.   I discussed the limitations, risks, security and privacy concerns of performing an evaluation and management service by telephone and the availability of in person appointments. I also discussed with the patient that there may be a patient responsible charge related to this service. The patient expressed understanding and agreed to proceed.   History of Present Illness:     Observations/Objective:   Assessment and Plan:   Follow Up Instructions:    I discussed the assessment and treatment plan with the patient. The patient was provided an opportunity to ask questions and all were answered. The patient agreed with the plan and demonstrated an understanding of the instructions.   The patient was advised to call back or seek an in-person evaluation if the symptoms worsen or if the condition fails to improve as anticipated.  I provided 24 minutes of non-face-to-face time during this encounter.   Dayton Bailiff, LPN    Review of Systems     Objective:   Physical Exam Physical exam unable to be accomplished via phone call       Assessment & Plan:  Diabetes stable Blood pressure stable Continue medication watch diet alert [place] if any low sugar spells Patient will do comprehensive lab work later in the summer with follow-up office visit Hyperlipidemia continue medication previous labs reviewed

## 2018-08-16 ENCOUNTER — Telehealth: Payer: Self-pay | Admitting: Family Medicine

## 2018-08-16 MED ORDER — PANTOPRAZOLE SODIUM 40 MG PO TBEC: 40.0000 mg | DELAYED_RELEASE_TABLET | Freq: Every day | ORAL | 3 refills | Status: DC

## 2018-08-16 MED ORDER — SUCRALFATE 1 G PO TABS: ORAL_TABLET | ORAL | 0 refills | Status: DC

## 2018-08-16 NOTE — Telephone Encounter (Signed)
Patient states stomach is upset and wanting something called in. She has been taking pepo bismol but not helping had phone visit 4/2.[place] [address]

## 2018-08-16 NOTE — Telephone Encounter (Signed)
Medication sent in. Pt is aware. Pt transferred up front to be placed on schedule for next Wednesday

## 2018-08-16 NOTE — Telephone Encounter (Signed)
I would recommend trying pantoprazole 40 mg 1 daily, #30, 3 refills in addition to this I recommend Carafate 1 g tablet 1 3 times daily for the next 2 weeks Please put this patient on the appointment list for next week Wednesday or Thursday or Friday as a follow-up discussion on her abdominal pain-preferably Wednesday

## 2018-08-16 NOTE — Telephone Encounter (Signed)
Pt states she talked with you about this issue on her phone visit on 4/2. She states she is not sure if you were going to send in a med or not.  States she has been having abominal pain after eating for about a little over one week now. No fever, no nausea, no vomiting, no diarrhea. Pain is all the time but worse after eating. Pain is below her navel.

## 2018-08-23 ENCOUNTER — Encounter: Payer: Self-pay | Admitting: Family Medicine

## 2018-08-23 ENCOUNTER — Other Ambulatory Visit: Payer: Self-pay

## 2018-08-23 ENCOUNTER — Ambulatory Visit (INDEPENDENT_AMBULATORY_CARE_PROVIDER_SITE_OTHER): Payer: Medicare Other | Admitting: Family Medicine

## 2018-08-23 VITALS — Wt 196.0 lb

## 2018-08-23 DIAGNOSIS — K2900 Acute gastritis without bleeding: Secondary | ICD-10-CM | POA: Diagnosis not present

## 2018-08-23 MED ORDER — TRAMADOL HCL 50 MG PO TABS: ORAL_TABLET | ORAL | 4 refills | Status: DC

## 2018-08-23 NOTE — Progress Notes (Signed)
   Subjective:    Patient ID: Shelly Jackson, female    DOB: 02/04/1935, 84 y.o.   MRN: [id number]  HPI Pt here for discussion regarding stomach issues. Pt states she is taking Protonix daily. This has helped. Pt is also taking Carafate 3 times a day. Pt states her stomach has gotten better.   This patient relates that the stomach discomfort is doing much better since taking the pantoprazole on a daily basis and also the sucralfate 3 times a day she denies any signs of any bleeding no hematemesis or hematochezia denies any significant abdominal discomforts.  Feeling much better  She does have intermittent back pain and joint pain which makes it difficult for her she states the tramadol does help it is not recommended for this patient to be on any type of anti-inflammatories because of the risk of bleeds plus also her age puts her in a higher risk of ulcers Virtual Visit via Telephone Note  I connected with Shelly Jackson on 08/23/18 at  9:00 AM EDT by telephone and verified that I am speaking with the correct person using two identifiers.   I discussed the limitations, risks, security and privacy concerns of performing an evaluation and management service by telephone and the availability of in person appointments. I also discussed with the patient that there may be a patient responsible charge related to this service. The patient expressed understanding and agreed to proceed.   History of Present Illness:    Observations/Objective:   Assessment and Plan:   Follow Up Instructions:    I discussed the assessment and treatment plan with the patient. The patient was provided an opportunity to ask questions and all were answered. The patient agreed with the plan and demonstrated an understanding of the instructions.   The patient was advised to call back or seek an in-person evaluation if the symptoms worsen or if the condition fails to improve as anticipated.  I provided 15 minutes of  non-face-to-face time during this encounter.   Vicente Males, LPN    Review of Systems     Objective:   Physical Exam        Assessment & Plan:  Gastritis-doing better continue to pantoprazole it is not necessary to stick with the sucralfate long-term.  We will follow-up again in approximately 4 months sooner if any problems  Arthralgias and joint discomforts it is okay for her to use tramadol up to a maximum of 3/day hopefully most days she can stick with 1 or 2 it seems to be helping her in does not have any major drawbacks she denies any drowsiness  She will do lab work later in the summer and follow-up in August

## 2018-08-28 ENCOUNTER — Other Ambulatory Visit: Payer: Self-pay | Admitting: Family Medicine

## 2018-08-31 ENCOUNTER — Other Ambulatory Visit: Payer: Self-pay | Admitting: Family Medicine

## 2018-09-19 DIAGNOSIS — H43811 Vitreous degeneration, right eye: Secondary | ICD-10-CM | POA: Diagnosis not present

## 2018-09-19 DIAGNOSIS — E113511 Type 2 diabetes mellitus with proliferative diabetic retinopathy with macular edema, right eye: Secondary | ICD-10-CM | POA: Diagnosis not present

## 2018-09-19 DIAGNOSIS — E113512 Type 2 diabetes mellitus with proliferative diabetic retinopathy with macular edema, left eye: Secondary | ICD-10-CM | POA: Diagnosis not present

## 2018-09-21 ENCOUNTER — Other Ambulatory Visit: Payer: Self-pay | Admitting: Family Medicine

## 2018-09-21 DIAGNOSIS — N3000 Acute cystitis without hematuria: Secondary | ICD-10-CM | POA: Diagnosis not present

## 2018-09-21 DIAGNOSIS — R3912 Poor urinary stream: Secondary | ICD-10-CM | POA: Diagnosis not present

## 2018-09-21 DIAGNOSIS — N200 Calculus of kidney: Secondary | ICD-10-CM | POA: Diagnosis not present

## 2018-09-28 ENCOUNTER — Other Ambulatory Visit: Payer: Self-pay | Admitting: Family Medicine

## 2018-10-17 ENCOUNTER — Telehealth: Payer: Self-pay | Admitting: Family Medicine

## 2018-10-17 MED ORDER — HYDROCHLOROTHIAZIDE 12.5 MG PO CAPS: 12.5000 mg | ORAL_CAPSULE | Freq: Every day | ORAL | 1 refills | Status: DC

## 2018-10-17 MED ORDER — ATORVASTATIN CALCIUM 20 MG PO TABS: 20.0000 mg | ORAL_TABLET | Freq: Every day | ORAL | 1 refills | Status: DC

## 2018-10-17 MED ORDER — LOSARTAN POTASSIUM 50 MG PO TABS: 50.0000 mg | ORAL_TABLET | Freq: Every day | ORAL | 1 refills | Status: DC

## 2018-10-17 MED ORDER — NIFEDIPINE ER OSMOTIC RELEASE 60 MG PO TB24: 60.0000 mg | ORAL_TABLET | Freq: Every day | ORAL | 1 refills | Status: DC

## 2018-10-17 MED ORDER — OXYBUTYNIN CHLORIDE ER 5 MG PO TB24: 5.0000 mg | ORAL_TABLET | Freq: Every day | ORAL | 1 refills | Status: DC

## 2018-10-17 MED ORDER — CITALOPRAM HYDROBROMIDE 20 MG PO TABS: 20.0000 mg | ORAL_TABLET | Freq: Every day | ORAL | 1 refills | Status: DC

## 2018-10-17 NOTE — Telephone Encounter (Signed)
Medication refills sent to pharmacy 

## 2018-10-17 NOTE — Telephone Encounter (Signed)
Ok six mo worth all

## 2018-10-17 NOTE — Telephone Encounter (Signed)
Fax from [facility]. Contacted patient to verify she was switching pharmacy. Pt verified that as of June 1st 2020 she was switching to [facility]. [employer] is requesting the following medications sent to them:Oxybutynin 5 mg Take one tablet by mouth daily Nifedipine (Procardia) 60 mg take one tablet by mouth daily  Citalopram 20 mg take one tablet by mouth daily  Losartan 50 mg tablet take one tablet by mouth daily  Atorvastatin 20 mg take one tablet by mouth daily HCTZ 12.5 mg capsule take one capsule by mouth daily.  Please advise. Thank you

## 2018-10-17 NOTE — Addendum Note (Signed)
Addended by: Vicente Males on: 10/17/2018 11:41 AM   Modules accepted: Orders

## 2018-10-19 ENCOUNTER — Other Ambulatory Visit: Payer: Self-pay | Admitting: Family Medicine

## 2018-10-24 ENCOUNTER — Other Ambulatory Visit: Payer: Self-pay | Admitting: *Deleted

## 2018-10-24 MED ORDER — OXYBUTYNIN CHLORIDE ER 5 MG PO TB24: 5.0000 mg | ORAL_TABLET | Freq: Every day | ORAL | 1 refills | Status: DC

## 2018-10-24 MED ORDER — LOSARTAN POTASSIUM 50 MG PO TABS: 50.0000 mg | ORAL_TABLET | Freq: Every day | ORAL | 1 refills | Status: DC

## 2018-10-24 MED ORDER — ATORVASTATIN CALCIUM 20 MG PO TABS: 20.0000 mg | ORAL_TABLET | Freq: Every day | ORAL | 1 refills | Status: DC

## 2018-10-24 MED ORDER — NIFEDIPINE ER OSMOTIC RELEASE 60 MG PO TB24: 60.0000 mg | ORAL_TABLET | Freq: Every day | ORAL | 1 refills | Status: DC

## 2018-10-24 MED ORDER — CITALOPRAM HYDROBROMIDE 20 MG PO TABS: 20.0000 mg | ORAL_TABLET | Freq: Every day | ORAL | 1 refills | Status: DC

## 2018-10-24 MED ORDER — ALPRAZOLAM 0.25 MG PO TABS: ORAL_TABLET | ORAL | 1 refills | Status: DC

## 2018-10-24 MED ORDER — HYDROCHLOROTHIAZIDE 12.5 MG PO CAPS: 12.5000 mg | ORAL_CAPSULE | Freq: Every day | ORAL | 1 refills | Status: DC

## 2018-11-14 DIAGNOSIS — H35043 Retinal micro-aneurysms, unspecified, bilateral: Secondary | ICD-10-CM | POA: Diagnosis not present

## 2018-11-14 DIAGNOSIS — E103512 Type 1 diabetes mellitus with proliferative diabetic retinopathy with macular edema, left eye: Secondary | ICD-10-CM | POA: Diagnosis not present

## 2018-11-14 DIAGNOSIS — E113513 Type 2 diabetes mellitus with proliferative diabetic retinopathy with macular edema, bilateral: Secondary | ICD-10-CM | POA: Diagnosis not present

## 2018-11-14 DIAGNOSIS — H43811 Vitreous degeneration, right eye: Secondary | ICD-10-CM | POA: Diagnosis not present

## 2018-11-27 ENCOUNTER — Telehealth: Payer: Self-pay | Admitting: Family Medicine

## 2018-11-27 NOTE — Telephone Encounter (Signed)
Patient made an appt for August 19th but will need refills on medications prior to appt.  Needs Lantus (pre-filled pens) and one touch test strips.  Patient said she has a new pharmacy, [facility] mail order pharmacy. (patient seemed confused about all of this.)

## 2018-11-27 NOTE — Telephone Encounter (Signed)
Pt last seen 4/15/202; please advise. Thank you

## 2018-11-30 MED ORDER — BLOOD GLUCOSE METER KIT: PACK | 0 refills | Status: DC

## 2018-11-30 MED ORDER — LANTUS SOLOSTAR 100 UNIT/ML ~~LOC~~ SOPN: PEN_INJECTOR | SUBCUTANEOUS | 1 refills | Status: DC

## 2018-11-30 MED ORDER — GLUCOSE BLOOD VI STRP: ORAL_STRIP | 1 refills | Status: DC

## 2018-11-30 NOTE — Telephone Encounter (Signed)
Please go ahead and give any refills as requested by patient and we will do follow-up in August-it would be best for the patient to also bring her medications when she comes in August so we can make sure we are on the same page as she is

## 2018-11-30 NOTE — Telephone Encounter (Signed)
Pt states she need refills on Lantus and test strips. She also states she needs a new meter because the one she has doesn't work well. Have sent refills in to [facility]. Also printed out script for new blood glucose monitor.

## 2018-12-05 ENCOUNTER — Telehealth: Payer: Self-pay | Admitting: Family Medicine

## 2018-12-05 NOTE — Telephone Encounter (Signed)
PA attempted for One Touch Ultra Strips. Sent to plan 12/05/2018. Await decision

## 2018-12-06 ENCOUNTER — Other Ambulatory Visit: Payer: Self-pay | Admitting: Family Medicine

## 2018-12-06 NOTE — Telephone Encounter (Signed)
PA denied under Medicare Part D but approved under Medicare part B. Approved through 05/10/2019.

## 2018-12-27 ENCOUNTER — Other Ambulatory Visit: Payer: Self-pay

## 2018-12-27 ENCOUNTER — Encounter: Payer: Self-pay | Admitting: Family Medicine

## 2018-12-27 ENCOUNTER — Ambulatory Visit (INDEPENDENT_AMBULATORY_CARE_PROVIDER_SITE_OTHER): Payer: Medicare PPO | Admitting: Family Medicine

## 2018-12-27 VITALS — BP 130/78 | Temp 97.0°F | Wt 191.4 lb

## 2018-12-27 DIAGNOSIS — E1142 Type 2 diabetes mellitus with diabetic polyneuropathy: Secondary | ICD-10-CM | POA: Diagnosis not present

## 2018-12-27 DIAGNOSIS — I10 Essential (primary) hypertension: Secondary | ICD-10-CM

## 2018-12-27 DIAGNOSIS — Z79899 Other long term (current) drug therapy: Secondary | ICD-10-CM | POA: Diagnosis not present

## 2018-12-27 DIAGNOSIS — I700 Atherosclerosis of aorta: Secondary | ICD-10-CM | POA: Diagnosis not present

## 2018-12-27 DIAGNOSIS — E133399 Other specified diabetes mellitus with moderate nonproliferative diabetic retinopathy without macular edema, unspecified eye: Secondary | ICD-10-CM

## 2018-12-27 MED ORDER — ATORVASTATIN CALCIUM 20 MG PO TABS: 20.0000 mg | ORAL_TABLET | Freq: Every day | ORAL | 1 refills | Status: DC

## 2018-12-27 MED ORDER — INSULIN SYRINGES (DISPOSABLE) U-100 1 ML MISC: 5 refills | Status: DC

## 2018-12-27 MED ORDER — FREESTYLE LIBRE 14 DAY SENSOR MISC: 2.0000 | 0 refills | Status: DC

## 2018-12-27 MED ORDER — CITALOPRAM HYDROBROMIDE 20 MG PO TABS: 20.0000 mg | ORAL_TABLET | Freq: Every day | ORAL | 1 refills | Status: DC

## 2018-12-27 MED ORDER — BLOOD GLUCOSE METER KIT: PACK | 0 refills | Status: DC

## 2018-12-27 MED ORDER — HYDROCHLOROTHIAZIDE 12.5 MG PO CAPS: 12.5000 mg | ORAL_CAPSULE | Freq: Every day | ORAL | 1 refills | Status: DC

## 2018-12-27 MED ORDER — LOSARTAN POTASSIUM 50 MG PO TABS: 50.0000 mg | ORAL_TABLET | Freq: Every day | ORAL | 1 refills | Status: DC

## 2018-12-27 MED ORDER — PANTOPRAZOLE SODIUM 40 MG PO TBEC: DELAYED_RELEASE_TABLET | ORAL | 1 refills | Status: DC

## 2018-12-27 MED ORDER — NIFEDIPINE ER OSMOTIC RELEASE 60 MG PO TB24: 60.0000 mg | ORAL_TABLET | Freq: Every day | ORAL | 1 refills | Status: DC

## 2018-12-27 MED ORDER — OXYBUTYNIN CHLORIDE ER 5 MG PO TB24: 5.0000 mg | ORAL_TABLET | Freq: Every day | ORAL | 1 refills | Status: DC

## 2018-12-27 MED ORDER — GLUCOSE BLOOD VI STRP: ORAL_STRIP | 1 refills | Status: AC

## 2018-12-27 MED ORDER — LANTUS SOLOSTAR 100 UNIT/ML ~~LOC~~ SOPN: PEN_INJECTOR | SUBCUTANEOUS | 1 refills | Status: DC

## 2018-12-27 MED ORDER — ALPRAZOLAM 0.25 MG PO TABS: ORAL_TABLET | ORAL | 1 refills | Status: DC

## 2018-12-27 MED ORDER — FREESTYLE LIBRE 14 DAY READER DEVI: 1.0000 | 0 refills | Status: DC

## 2018-12-27 MED ORDER — METFORMIN HCL 1000 MG PO TABS: ORAL_TABLET | ORAL | 1 refills | Status: DC

## 2018-12-27 NOTE — Progress Notes (Signed)
Subjective:    Patient ID: Shelly Jackson, female    DOB: 1935/04/19, 84 y.o.   MRN: [id number] Very nice patient Here today for inpatient visit 25 minutes was spent with the patient.  This statement verifies that 25 minutes was indeed spent with the patient.  More than 50% of this visit-total duration of the visit-was spent in counseling and coordination of care. The issues that the patient came in for today as reflected in the diagnosis (s) please refer to documentation for further details. Complex related issues HPI Pt here today to for medication refills. Pt states that she received refills on most of her meds on Monday.   Pt states that she would be interested in getting a meter that attaches to skin because she has trouble with her meter.  She relates she has a hard time putting her finger reading the machine she is hoping the one that would go on her skin would do better  We did go through all of her medications today.  She does use Xanax at nighttime to help her rest denies it causing any difficulties She uses her insulin on a regular basis watches her diet states most of her sugars are in the low 100s to mid 100s denies low sugar spells She still takes metformin and tolerates it well we try to check her labs on a regular basis she will be doing some soon She does take a acid blocker on a regular basis without it she has significant dyspepsia and burning in her esophagus she denies nausea vomiting diarrhea She does take tramadol to help her with day-to-day discomforts.  She states it does not cause drowsiness with her She does use her losartan on a regular basis to help her with blood pressure she denies excessive salt She does take her citalopram on a regular basis states it helps her with her mood she would like to continue it but she denies being depressed Patient does state that her diuretic seems to do a decent job for her  Review of Systems  Constitutional: Negative for  activity change, appetite change and fatigue.  HENT: Negative for congestion and rhinorrhea.   Respiratory: Negative for cough and shortness of breath.   Cardiovascular: Negative for chest pain and leg swelling.  Gastrointestinal: Negative for abdominal pain and diarrhea.  Endocrine: Negative for polydipsia and polyphagia.  Skin: Negative for color change.  Neurological: Negative for dizziness and weakness.  Psychiatric/Behavioral: Negative for behavioral problems and confusion.       Objective:   Physical Exam Vitals signs reviewed.  Constitutional:      General: She is not in acute distress. HENT:     Head: Normocephalic and atraumatic.  Eyes:     General:        Right eye: No discharge.        Left eye: No discharge.  Neck:     Trachea: No tracheal deviation.  Cardiovascular:     Rate and Rhythm: Normal rate and regular rhythm.     Heart sounds: Normal heart sounds. No murmur.  Pulmonary:     Effort: Pulmonary effort is normal. No respiratory distress.     Breath sounds: Normal breath sounds.  Lymphadenopathy:     Cervical: No cervical adenopathy.  Skin:    General: Skin is warm and dry.  Neurological:     Mental Status: She is alert.     Coordination: Coordination normal.  Psychiatric:  Behavior: Behavior normal.    See diabetic foot exam       Assessment & Plan:  1. HTN (hypertension), benign HTN- Patient was seen today as part of a visit regarding hypertension. The importance of healthy diet and regular physical activity was discussed. The importance of compliance with medications discussed.  Ideal goal is to keep blood pressure low elevated levels certainly below 197/58 when possible.  The patient was counseled that keeping blood pressure under control lessen his risk of complications.  The importance of regular follow-ups was discussed with the patient.  Low-salt diet such as DASH recommended.  Regular physical activity was recommended as well.   Patient was advised to keep regular follow-ups. I checked her blood pressure today I feel it is doing well for her she is to keep up her off some work  2. Type 2 diabetes mellitus with diabetic polyneuropathy, unspecified whether long term insulin use ([place]) So she does have diabetic neuropathy but is not causing her any severe pain and she uses a walker to get around no falls recently Her diabetes seemingly under good control we will check lab work await the results of all this she will continue her medicine watching her diet  3. Diabetic peripheral neuropathy ([facility]) Mild neuropathy is not severe she has decent fitting shoes no signs of any ulcers and uses a walker she keeps up a fantastic effort  4. 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 Aortic atherosclerosis ([place]) She does have a history of aortic atherosclerosis and she knows the importance of keeping her cholesterol under good control-she does take her medicine on a regular basis and should be doing some lab work today  We will see her back in 3 months

## 2018-12-28 LAB — HEPATIC FUNCTION PANEL
ALT: 14 IU/L (ref 0–32)
AST: 17 IU/L (ref 0–40)
Albumin: 4.2 g/dL (ref 3.6–4.6)
Alkaline Phosphatase: 71 IU/L (ref 39–117)
Bilirubin Total: 0.6 mg/dL (ref 0.0–1.2)
Bilirubin, Direct: 0.21 mg/dL (ref 0.00–0.40)
Total Protein: 6.5 g/dL (ref 6.0–8.5)

## 2018-12-28 LAB — LIPID PANEL
Chol/HDL Ratio: 2.5 ratio (ref 0.0–4.4)
Cholesterol, Total: 117 mg/dL (ref 100–199)
HDL: 47 mg/dL (ref >39)
LDL Calculated: 55 mg/dL (ref 0–99)
Triglycerides: 75 mg/dL (ref 0–149)
VLDL Cholesterol Cal: 15 mg/dL (ref 5–40)

## 2018-12-28 LAB — BASIC METABOLIC PANEL
BUN/Creatinine Ratio: 30 — ABNORMAL HIGH (ref 12–28)
BUN: 24 mg/dL (ref 8–27)
CO2: 27 mmol/L (ref 20–29)
Calcium: 9.2 mg/dL (ref 8.7–10.3)
Chloride: 101 mmol/L (ref 96–106)
Creatinine, Ser: 0.8 mg/dL (ref 0.57–1.00)
GFR calc Af Amer: 80 mL/min/{1.73_m2} (ref >59)
GFR calc non Af Amer: 69 mL/min/{1.73_m2} (ref >59)
Glucose: 87 mg/dL (ref 65–99)
Potassium: 3.4 mmol/L — ABNORMAL LOW (ref 3.5–5.2)
Sodium: 143 mmol/L (ref 134–144)

## 2018-12-28 LAB — HEMOGLOBIN A1C
Est. average glucose Bld gHb Est-mCnc: 148 mg/dL
Hgb A1c MFr Bld: 6.8 % — ABNORMAL HIGH (ref 4.8–5.6)

## 2018-12-30 ENCOUNTER — Encounter: Payer: Self-pay | Admitting: Family Medicine

## 2019-01-04 ENCOUNTER — Other Ambulatory Visit: Payer: Self-pay | Admitting: Family Medicine

## 2019-01-09 DIAGNOSIS — H3561 Retinal hemorrhage, right eye: Secondary | ICD-10-CM | POA: Diagnosis not present

## 2019-01-09 DIAGNOSIS — E113513 Type 2 diabetes mellitus with proliferative diabetic retinopathy with macular edema, bilateral: Secondary | ICD-10-CM | POA: Diagnosis not present

## 2019-01-09 DIAGNOSIS — E113512 Type 2 diabetes mellitus with proliferative diabetic retinopathy with macular edema, left eye: Secondary | ICD-10-CM | POA: Diagnosis not present

## 2019-01-09 DIAGNOSIS — H43811 Vitreous degeneration, right eye: Secondary | ICD-10-CM | POA: Diagnosis not present

## 2019-01-10 ENCOUNTER — Telehealth: Payer: Self-pay | Admitting: Family Medicine

## 2019-01-10 NOTE — Telephone Encounter (Signed)
Nurses I will need additional information How often is the nosebleed? Is a nosebleed lasting for a long time or a few minutes?  How that she get it to stop?  What has she tried so far?  There really is no prescription medicine for nosebleeds but there are some advice I can give once I have this additional information  Occasionally we will have to refer her to ENT

## 2019-01-10 NOTE — Telephone Encounter (Signed)
Left message to return call 

## 2019-01-10 NOTE — Telephone Encounter (Signed)
Patient was seen 8/19 and forgot to mention that she had been having nose bleed from a fall back in December of 2019.She wanting you to call somerthing into [facility] main [place]

## 2019-01-10 NOTE — Telephone Encounter (Signed)
Please advise. Thank you

## 2019-01-12 NOTE — Telephone Encounter (Signed)
Pt states headaches and nose bleeds started after her fall in December. The blood dose not pour out but she feels it building up in her nose and she gets a kleenex cleans out her nose about 2 -3 times a day. She states she saw eye dr Tuesday and he put a needle in her eye and then her nose did start to pour blood but that was the only time that happened she asked eye dr about it and he told her he didn't know much about nose bleeds just eyes. She thinks it is something from the fall.

## 2019-01-12 NOTE — Telephone Encounter (Signed)
Discussed with pt and pt states she does not want to go anywhere right now and will call back when she is ready

## 2019-01-12 NOTE — Telephone Encounter (Signed)
I understand her point of view stating that it is from the fall but this needs further looking into I recommend consultation with ENT Please set up referral to Dr.Teoh

## 2019-01-12 NOTE — Telephone Encounter (Signed)
Discussed with pt. Pt verbalized understanding.  °

## 2019-01-12 NOTE — Telephone Encounter (Signed)
So noted Use saline nasal spray Squeeze the nose with direct pressure when bleeding occurs If ongoing troubles I do recommend ENT

## 2019-01-17 DIAGNOSIS — G4733 Obstructive sleep apnea (adult) (pediatric): Secondary | ICD-10-CM | POA: Diagnosis not present

## 2019-01-18 ENCOUNTER — Other Ambulatory Visit: Payer: Self-pay

## 2019-01-18 MED ORDER — METFORMIN HCL 1000 MG PO TABS: ORAL_TABLET | ORAL | 1 refills | Status: DC

## 2019-02-05 ENCOUNTER — Telehealth: Payer: Self-pay | Admitting: Family Medicine

## 2019-02-05 NOTE — Telephone Encounter (Signed)
Patient states has problems with urination all the time now and wondering if its her medication she was put on for her stomach issues causing this problem. She states that she even wets the bed at night. She has appointment with her kidney doctor on 9/30. She is currently taking pantoprazole 40 mg and tramadol 50 mg will that have affect on her kidneys. Please advice

## 2019-02-05 NOTE — Telephone Encounter (Signed)
Tried to call no answer

## 2019-02-05 NOTE — Telephone Encounter (Signed)
Nurses please call patient I reviewed over her medications I do not feel the medications are causing her problem.  When she sees the urologist more than likely they will do a urinalysis and culture and hopefully come up with a plan or solution for her issues.  Certainly if she is having ongoing troubles despite their efforts to follow-up with [place]

## 2019-02-07 ENCOUNTER — Ambulatory Visit (INDEPENDENT_AMBULATORY_CARE_PROVIDER_SITE_OTHER): Payer: Medicare PPO | Admitting: Urology

## 2019-02-07 ENCOUNTER — Other Ambulatory Visit (HOSPITAL_COMMUNITY)
Admission: AD | Admit: 2019-02-07 | Discharge: 2019-02-07 | Disposition: A | Discharge status: Home / Self Care | Payer: Medicare PPO | Source: Skilled Nursing Facility | Attending: Urology | Admitting: Urology

## 2019-02-07 DIAGNOSIS — R3912 Poor urinary stream: Secondary | ICD-10-CM | POA: Diagnosis not present

## 2019-02-07 DIAGNOSIS — N3000 Acute cystitis without hematuria: Secondary | ICD-10-CM | POA: Insufficient documentation

## 2019-02-07 NOTE — Telephone Encounter (Signed)
Discussed with pt. Pt verbalized understanding.  °

## 2019-02-09 LAB — URINE CULTURE: Culture: >=100000 — AB

## 2019-02-12 ENCOUNTER — Other Ambulatory Visit (HOSPITAL_COMMUNITY): Payer: Self-pay | Admitting: Urology

## 2019-02-12 ENCOUNTER — Other Ambulatory Visit: Payer: Self-pay | Admitting: Urology

## 2019-02-12 DIAGNOSIS — N200 Calculus of kidney: Secondary | ICD-10-CM

## 2019-03-15 DIAGNOSIS — I2510 Atherosclerotic heart disease of native coronary artery without angina pectoris: Secondary | ICD-10-CM | POA: Diagnosis not present

## 2019-03-15 DIAGNOSIS — I359 Nonrheumatic aortic valve disorder, unspecified: Secondary | ICD-10-CM | POA: Diagnosis not present

## 2019-03-15 DIAGNOSIS — I119 Hypertensive heart disease without heart failure: Secondary | ICD-10-CM | POA: Diagnosis not present

## 2019-03-15 DIAGNOSIS — E785 Hyperlipidemia, unspecified: Secondary | ICD-10-CM | POA: Diagnosis not present

## 2019-03-20 DIAGNOSIS — E113511 Type 2 diabetes mellitus with proliferative diabetic retinopathy with macular edema, right eye: Secondary | ICD-10-CM | POA: Diagnosis not present

## 2019-03-20 DIAGNOSIS — H35043 Retinal micro-aneurysms, unspecified, bilateral: Secondary | ICD-10-CM | POA: Diagnosis not present

## 2019-03-20 DIAGNOSIS — H43811 Vitreous degeneration, right eye: Secondary | ICD-10-CM | POA: Diagnosis not present

## 2019-03-20 DIAGNOSIS — E113513 Type 2 diabetes mellitus with proliferative diabetic retinopathy with macular edema, bilateral: Secondary | ICD-10-CM | POA: Diagnosis not present

## 2019-03-20 DIAGNOSIS — E113512 Type 2 diabetes mellitus with proliferative diabetic retinopathy with macular edema, left eye: Secondary | ICD-10-CM | POA: Diagnosis not present

## 2019-03-21 ENCOUNTER — Ambulatory Visit (INDEPENDENT_AMBULATORY_CARE_PROVIDER_SITE_OTHER): Payer: Medicare PPO | Admitting: Urology

## 2019-03-21 DIAGNOSIS — R3912 Poor urinary stream: Secondary | ICD-10-CM | POA: Diagnosis not present

## 2019-03-21 DIAGNOSIS — N3000 Acute cystitis without hematuria: Secondary | ICD-10-CM

## 2019-03-27 ENCOUNTER — Other Ambulatory Visit: Payer: Self-pay

## 2019-03-27 MED ORDER — ACCU-CHEK SOFTCLIX LANCETS MISC: 1 refills | Status: DC

## 2019-03-27 MED ORDER — GLUCOSE BLOOD VI STRP: ORAL_STRIP | 1 refills | Status: DC

## 2019-03-27 MED ORDER — BLOOD GLUCOSE METER KIT: PACK | 0 refills | Status: DC

## 2019-03-29 ENCOUNTER — Ambulatory Visit: Payer: Medicare PPO | Admitting: Family Medicine

## 2019-03-30 ENCOUNTER — Ambulatory Visit (INDEPENDENT_AMBULATORY_CARE_PROVIDER_SITE_OTHER): Payer: Medicare PPO | Admitting: Family Medicine

## 2019-03-30 ENCOUNTER — Other Ambulatory Visit: Payer: Self-pay

## 2019-03-30 DIAGNOSIS — I10 Essential (primary) hypertension: Secondary | ICD-10-CM | POA: Diagnosis not present

## 2019-03-30 DIAGNOSIS — E1142 Type 2 diabetes mellitus with diabetic polyneuropathy: Secondary | ICD-10-CM | POA: Diagnosis not present

## 2019-03-30 MED ORDER — LANTUS SOLOSTAR 100 UNIT/ML ~~LOC~~ SOPN: PEN_INJECTOR | SUBCUTANEOUS | 1 refills | Status: DC

## 2019-03-30 MED ORDER — PRAVASTATIN SODIUM 10 MG PO TABS: 10.0000 mg | ORAL_TABLET | Freq: Every day | ORAL | 1 refills | Status: DC

## 2019-03-30 MED ORDER — NIFEDIPINE ER OSMOTIC RELEASE 60 MG PO TB24: 60.0000 mg | ORAL_TABLET | Freq: Every day | ORAL | 1 refills | Status: DC

## 2019-03-30 MED ORDER — CITALOPRAM HYDROBROMIDE 20 MG PO TABS: 20.0000 mg | ORAL_TABLET | Freq: Every day | ORAL | 1 refills | Status: DC

## 2019-03-30 MED ORDER — ALPRAZOLAM 0.25 MG PO TABS: ORAL_TABLET | ORAL | 1 refills | Status: DC

## 2019-03-30 MED ORDER — HYDROCHLOROTHIAZIDE 12.5 MG PO CAPS: 12.5000 mg | ORAL_CAPSULE | Freq: Every day | ORAL | 1 refills | Status: DC

## 2019-03-30 MED ORDER — TRIAMCINOLONE ACETONIDE 0.1 % EX CREA: TOPICAL_CREAM | CUTANEOUS | 5 refills | Status: DC

## 2019-03-30 MED ORDER — METFORMIN HCL 1000 MG PO TABS: ORAL_TABLET | ORAL | 1 refills | Status: DC

## 2019-03-30 MED ORDER — LOSARTAN POTASSIUM 50 MG PO TABS: 50.0000 mg | ORAL_TABLET | Freq: Every day | ORAL | 1 refills | Status: DC

## 2019-03-30 NOTE — Progress Notes (Signed)
   Subjective:    Patient ID: Shelly Jackson, female    DOB: [birth year], 84 y.o.   MRN: [id number]  Diabetes She presents for her follow-up diabetic visit. She has type 2 diabetes mellitus. Pertinent negatives for hypoglycemia include no confusion or dizziness. Pertinent negatives for diabetes include no chest pain, no fatigue, no polydipsia, no polyphagia and no weakness. She is compliant with treatment all of the time. Home blood sugar record trend: around 115.   Pt needs refill on triamcinolone. Uses for itching around the umbilicus.   Needs refill on xanax. Uses for sleep.   Pt states she is taking atorvastatin 20mg  and pravastatin 10mg .    Virtual Visit via Telephone Note  I connected with Shelly Jackson on 03/30/19 at 10:00 AM EST by telephone and verified that I am speaking with the correct person using two identifiers.  Location: Patient: home Provider: office   I discussed the limitations, risks, security and privacy concerns of performing an evaluation and management service by telephone and the availability of in person appointments. I also discussed with the patient that there may be a patient responsible charge related to this service. The patient expressed understanding and agreed to proceed.   History of Present Illness:    Observations/Objective:   Assessment and Plan:   Follow Up Instructions:    I discussed the assessment and treatment plan with the patient. The patient was provided an opportunity to ask questions and all were answered. The patient agreed with the plan and demonstrated an understanding of the instructions.   The patient was advised to call back or seek an in-person evaluation if the symptoms worsen or if the condition fails to improve as anticipated.  I provided 15 minutes of non-face-to-face time during this encounter.   Review of Systems  Constitutional: Negative for activity change, appetite change and fatigue.  HENT: Negative for  congestion and rhinorrhea.   Respiratory: Negative for cough and shortness of breath.   Cardiovascular: Negative for chest pain and leg swelling.  Gastrointestinal: Negative for abdominal pain and diarrhea.  Endocrine: Negative for polydipsia and polyphagia.  Skin: Negative for color change.  Neurological: Negative for dizziness and weakness.  Psychiatric/Behavioral: Negative for behavioral problems and confusion.       Objective:   Physical Exam  Patient had virtual visit Appears to be in no distress Atraumatic Neuro able to relate and oriented No apparent resp distress Color normal       Assessment & Plan:  Very difficult to evaluate this patient by phone she would like to be seen for multiple joint pains and complaints she is hoping she can get a x-ray of her whole body I told her that that really was not something we would do but we would be willing to see her in person in several weeks she agreed to that  Renewal of her medicines 15 minutes was spent with patient today discussing healthcare issues which they came.  More than 50% of this visit-total duration of visit-was spent in counseling and coordination of care.  Please see diagnosis regarding the focus of this coordination and care

## 2019-04-20 ENCOUNTER — Other Ambulatory Visit: Payer: Self-pay | Admitting: Family Medicine

## 2019-04-23 NOTE — Telephone Encounter (Signed)
Last med check nov 2020

## 2019-04-25 ENCOUNTER — Ambulatory Visit: Payer: Medicare PPO | Admitting: Family Medicine

## 2019-05-01 ENCOUNTER — Telehealth: Payer: Self-pay | Admitting: Family Medicine

## 2019-05-01 DIAGNOSIS — E1142 Type 2 diabetes mellitus with diabetic polyneuropathy: Secondary | ICD-10-CM

## 2019-05-01 DIAGNOSIS — E782 Mixed hyperlipidemia: Secondary | ICD-10-CM

## 2019-05-01 DIAGNOSIS — Z79899 Other long term (current) drug therapy: Secondary | ICD-10-CM

## 2019-05-01 DIAGNOSIS — I10 Essential (primary) hypertension: Secondary | ICD-10-CM

## 2019-05-01 NOTE — Telephone Encounter (Signed)
Pt has appt tomorrow and wants to know if she needs to go by the lab first.

## 2019-05-01 NOTE — Telephone Encounter (Signed)
Lab orders placed and pt is aware. Pt states she will come early in the morning to go to lab.

## 2019-05-01 NOTE — Telephone Encounter (Signed)
Lipid, liver, metabolic 7, [id number], urine ACR-please give the patient a heads up that we would like to collect a urine as well she could do these labs today and we would have most of the results back tomorrow if she is unable to do them today she could do the labs tomorrow and we would call her with the results

## 2019-05-01 NOTE — Telephone Encounter (Signed)
Last labs 12/27/18 lipid, liver, bmp, a1c

## 2019-05-02 ENCOUNTER — Other Ambulatory Visit: Payer: Self-pay

## 2019-05-02 ENCOUNTER — Encounter: Payer: Self-pay | Admitting: Family Medicine

## 2019-05-02 ENCOUNTER — Ambulatory Visit (INDEPENDENT_AMBULATORY_CARE_PROVIDER_SITE_OTHER): Payer: Medicare PPO | Admitting: Family Medicine

## 2019-05-02 VITALS — BP 130/76 | Temp 97.1°F | Wt 203.6 lb

## 2019-05-02 DIAGNOSIS — R300 Dysuria: Secondary | ICD-10-CM | POA: Diagnosis not present

## 2019-05-02 DIAGNOSIS — E782 Mixed hyperlipidemia: Secondary | ICD-10-CM | POA: Diagnosis not present

## 2019-05-02 DIAGNOSIS — Z23 Encounter for immunization: Secondary | ICD-10-CM

## 2019-05-02 DIAGNOSIS — M25519 Pain in unspecified shoulder: Secondary | ICD-10-CM

## 2019-05-02 DIAGNOSIS — M549 Dorsalgia, unspecified: Secondary | ICD-10-CM

## 2019-05-02 DIAGNOSIS — I10 Essential (primary) hypertension: Secondary | ICD-10-CM | POA: Diagnosis not present

## 2019-05-02 DIAGNOSIS — G4733 Obstructive sleep apnea (adult) (pediatric): Secondary | ICD-10-CM | POA: Diagnosis not present

## 2019-05-02 DIAGNOSIS — Z79899 Other long term (current) drug therapy: Secondary | ICD-10-CM | POA: Diagnosis not present

## 2019-05-02 DIAGNOSIS — E1142 Type 2 diabetes mellitus with diabetic polyneuropathy: Secondary | ICD-10-CM | POA: Diagnosis not present

## 2019-05-02 LAB — POCT URINALYSIS DIPSTICK
Spec Grav, UA: 1.02 (ref 1.010–1.025)
Urobilinogen, UA: 0.2 E.U./dL
pH, UA: 5 (ref 5.0–8.0)

## 2019-05-02 NOTE — Progress Notes (Signed)
Subjective:    Patient ID: Shelly Jackson, female    DOB: 1934/08/10, 84 y.o.   MRN: [id number]  HPI Pt states she had a fall in early December at [place]. Pt states her ears, head and knees are hurting. Pt states that her right ear is the one mostly bothering her. Pt also feel out of bathtub and hit head. Pt son installed new shower.  Patient relates some pain in the back of her neck also relates bilateral in the knees also relates the right shoulder with difficulty raising arm above her shoulder although this related to some falls  We spent time today going over all of her medicines I looked at every bottle she had we talked about the importance of staying healthy and taking your medications as directed.  Apparently somehow she had 3 bottles of statins we narrowed it down to the 1 that she is on.  25 minutes was spent with the patient.  This statement verifies that 25 minutes was indeed spent with the patient.  More than 50% of this visit-total duration of the visit-was spent in counseling and coordination of care. The issues that the patient came in for today as reflected in the diagnosis (s) please refer to documentation for further details.  Review of Systems  Constitutional: Negative for activity change, appetite change and fatigue.  HENT: Negative for congestion and rhinorrhea.   Respiratory: Negative for cough and shortness of breath.   Cardiovascular: Negative for chest pain and leg swelling.  Gastrointestinal: Negative for abdominal pain and diarrhea.  Endocrine: Negative for polydipsia and polyphagia.  Skin: Negative for color change.  Neurological: Negative for dizziness and weakness.  Psychiatric/Behavioral: Negative for behavioral problems and confusion.       Objective:   Physical Exam Vitals reviewed.  Constitutional:      General: She is not in acute distress. HENT:     Head: Normocephalic and atraumatic.  Eyes:     General:        Right eye: No discharge.         Left eye: No discharge.  Neck:     Trachea: No tracheal deviation.  Cardiovascular:     Rate and Rhythm: Normal rate and regular rhythm.     Heart sounds: Normal heart sounds. No murmur.  Pulmonary:     Effort: Pulmonary effort is normal. No respiratory distress.     Breath sounds: Normal breath sounds.  Lymphadenopathy:     Cervical: No cervical adenopathy.  Skin:    General: Skin is warm and dry.  Neurological:     Mental Status: She is alert.     Coordination: Coordination normal.  Psychiatric:        Behavior: Behavior normal.    Fall Risk  05/02/2019 03/30/2019 03/29/2018 12/12/2017 06/29/2016  Falls in the past year? 1 0 1 Yes Yes  Comment - - Emmi Telephone Survey: data to providers prior to load Emmi Telephone Survey: data to providers prior to load -  Number falls in past yr: 1 - 1 2 or more 1  Comment - - Emmi Telephone Survey Actual Response = 1 Emmi Telephone Survey Actual Response = 3 -  Injury with Fall? 1 - 1 No No  Risk for fall due to : Impaired balance/gait;Impaired mobility;History of fall(s) Impaired balance/gait;Impaired mobility - - Impaired vision  Follow up Education provided;Falls evaluation completed Falls evaluation completed - - Falls prevention discussed          Assessment &  Plan:  1. Need for vaccination Flu shot today - Flu Vaccine QUAD 6+ mos PF IM (Fluarix Quad PF)  2. Dysuria Urine overall looks good I find no evidence of infection - POCT Urinalysis Dipstick  3. Back pain, unspecified back location, unspecified back pain laterality, unspecified chronicity She took a fall has pain in the back of her head intermittently I doubt that she has any type of intracerebral bleeding no neurologic symptoms x-ray of the spine recommended - DG Cervical Spine Complete  4. Shoulder pain, unspecified chronicity, unspecified laterality Shoulder pain due to the fall we will do x-rays to see how this is - DG Shoulder Right  Hypertension she does have  some relative drop in blood pressure when she stands I recommend that she go ahead with stopping tamsulosin which she was taking daily   Diabetes reportedly good control Sugar looks good today lab work is pending await the results of all of this  I reviewed over all of her medicines somehow she was taking more than one statin this is been cut back to just pravastatin 10 mg daily  I also recommend that she stop oxybutynin  Her moods overall are doing well continue the Celexa  Insomnia and anxiety issues uses Xanax at nighttime to help her relax very low dose she has been on this for years we will continue for now

## 2019-05-03 LAB — BASIC METABOLIC PANEL
BUN/Creatinine Ratio: 22 (ref 12–28)
BUN: 17 mg/dL (ref 8–27)
CO2: 26 mmol/L (ref 20–29)
Calcium: 8.9 mg/dL (ref 8.7–10.3)
Chloride: 102 mmol/L (ref 96–106)
Creatinine, Ser: 0.78 mg/dL (ref 0.57–1.00)
GFR calc Af Amer: 82 mL/min/{1.73_m2} (ref >59)
GFR calc non Af Amer: 71 mL/min/{1.73_m2} (ref >59)
Glucose: 91 mg/dL (ref 65–99)
Potassium: 4.1 mmol/L (ref 3.5–5.2)
Sodium: 142 mmol/L (ref 134–144)

## 2019-05-03 LAB — LIPID PANEL
Chol/HDL Ratio: 2.5 ratio (ref 0.0–4.4)
Cholesterol, Total: 121 mg/dL (ref 100–199)
HDL: 48 mg/dL (ref >39)
LDL Chol Calc (NIH): 56 mg/dL (ref 0–99)
Triglycerides: 91 mg/dL (ref 0–149)
VLDL Cholesterol Cal: 17 mg/dL (ref 5–40)

## 2019-05-03 LAB — HEPATIC FUNCTION PANEL
ALT: 16 IU/L (ref 0–32)
AST: 16 IU/L (ref 0–40)
Albumin: 4.3 g/dL (ref 3.6–4.6)
Alkaline Phosphatase: 70 IU/L (ref 39–117)
Bilirubin Total: 0.6 mg/dL (ref 0.0–1.2)
Bilirubin, Direct: 0.22 mg/dL (ref 0.00–0.40)
Total Protein: 6.5 g/dL (ref 6.0–8.5)

## 2019-05-03 LAB — MICROALBUMIN / CREATININE URINE RATIO
Creatinine, Urine: 118.5 mg/dL
Microalb/Creat Ratio: 6 mg/g creat (ref 0–29)
Microalbumin, Urine: 7.6 ug/mL

## 2019-05-03 LAB — HEMOGLOBIN A1C
Est. average glucose Bld gHb Est-mCnc: 154 mg/dL
Hgb A1c MFr Bld: 7 % — ABNORMAL HIGH (ref 4.8–5.6)

## 2019-05-06 ENCOUNTER — Encounter: Payer: Self-pay | Admitting: Family Medicine

## 2019-06-01 ENCOUNTER — Ambulatory Visit (HOSPITAL_COMMUNITY)
Admission: RE | Admit: 2019-06-01 | Discharge: 2019-06-01 | Disposition: A | Discharge status: Home / Self Care | Payer: Medicare Other | Source: Ambulatory Visit | Attending: Family Medicine | Admitting: Family Medicine

## 2019-06-01 ENCOUNTER — Other Ambulatory Visit: Payer: Self-pay

## 2019-06-01 DIAGNOSIS — M25519 Pain in unspecified shoulder: Secondary | ICD-10-CM | POA: Insufficient documentation

## 2019-06-01 DIAGNOSIS — M549 Dorsalgia, unspecified: Secondary | ICD-10-CM | POA: Insufficient documentation

## 2019-06-18 ENCOUNTER — Telehealth: Payer: Self-pay | Admitting: Family Medicine

## 2019-06-18 MED ORDER — INSULIN SYRINGES (DISPOSABLE) U-100 1 ML MISC: 5 refills | Status: DC

## 2019-06-18 MED ORDER — PANTOPRAZOLE SODIUM 40 MG PO TBEC: DELAYED_RELEASE_TABLET | ORAL | 1 refills | Status: DC

## 2019-06-18 MED ORDER — METFORMIN HCL 1000 MG PO TABS: ORAL_TABLET | ORAL | 1 refills | Status: DC

## 2019-06-18 MED ORDER — NIFEDIPINE ER OSMOTIC RELEASE 60 MG PO TB24: 60.0000 mg | ORAL_TABLET | Freq: Every day | ORAL | 1 refills | Status: DC

## 2019-06-18 MED ORDER — CITALOPRAM HYDROBROMIDE 20 MG PO TABS: 20.0000 mg | ORAL_TABLET | Freq: Every day | ORAL | 1 refills | Status: DC

## 2019-06-18 MED ORDER — AMOXICILLIN 500 MG PO CAPS: 500.0000 mg | ORAL_CAPSULE | Freq: Three times a day (TID) | ORAL | 0 refills | Status: DC

## 2019-06-18 MED ORDER — LOSARTAN POTASSIUM 50 MG PO TABS: 50.0000 mg | ORAL_TABLET | Freq: Every day | ORAL | 1 refills | Status: DC

## 2019-06-18 MED ORDER — PRAVASTATIN SODIUM 10 MG PO TABS: 10.0000 mg | ORAL_TABLET | Freq: Every day | ORAL | 1 refills | Status: DC

## 2019-06-18 MED ORDER — LANTUS SOLOSTAR 100 UNIT/ML ~~LOC~~ SOPN: PEN_INJECTOR | SUBCUTANEOUS | 1 refills | Status: DC

## 2019-06-18 MED ORDER — HYDROCHLOROTHIAZIDE 12.5 MG PO CAPS: 12.5000 mg | ORAL_CAPSULE | Freq: Every day | ORAL | 1 refills | Status: DC

## 2019-06-18 NOTE — Telephone Encounter (Signed)
Patient notified and verbalized understanding. Patient will call back for follow up office visit if continued symptoms.

## 2019-06-18 NOTE — Telephone Encounter (Signed)
The number given is the number to [facility] customer service and they state they can not take prescriptions. Patient looked on card and they use [facility]    Patient states she is still having headaches and nose bleeds and ear aches and thinks she needs the xray agin to check on everything- patient said she had it before about a year ago after a fall

## 2019-06-18 NOTE — Telephone Encounter (Signed)
1.  She may have 90-day refills on her medications through [facility] along with 1 additional refill #2 I would recommend trying amoxicillin 500 mg 1 taken 3 times daily for the next 7 days for possible sinus infection #3 I recommend saline nasal spray on a regular basis #4 if ongoing trouble with headaches follow-up I would not recommend doing x-ray it really would not likely show anything further evaluation would be needed before deciding on any further tests

## 2019-06-18 NOTE — Telephone Encounter (Signed)
Pt is requesting refill on ALL medications she states we need to call [facility] and they will be mailed to her. [id number].  Also would like to talk to nurse about needing xray from where ears are still hurting and still having nose bleeds.

## 2019-06-22 ENCOUNTER — Telehealth: Payer: Self-pay | Admitting: Family Medicine

## 2019-06-22 MED ORDER — ALPRAZOLAM 0.25 MG PO TABS: ORAL_TABLET | ORAL | 0 refills | Status: DC

## 2019-06-22 NOTE — Telephone Encounter (Signed)
Prescription faxed to pharmacy. Patient notified  Patient states the weather has been causing her arthritis to act up and wondered if she could get a prescription for pain medication sent in to use. Patient states she will use Tylenol over the weekend and wait to see if Dr Nicki Reaper thougt pain medication would be ok for her

## 2019-06-22 NOTE — Telephone Encounter (Signed)
Ok plus one ref 

## 2019-06-22 NOTE — Telephone Encounter (Signed)
Patient needs refill on xanax 0.25 called into [place]

## 2019-06-22 NOTE — Telephone Encounter (Signed)
Check up 05/02/19

## 2019-07-01 NOTE — Telephone Encounter (Signed)
Unfortunately pain medication could cause increased risk of falls therefore patient needs to stick with Tylenol as best as possible thank you

## 2019-07-02 ENCOUNTER — Other Ambulatory Visit: Payer: Self-pay

## 2019-07-02 ENCOUNTER — Encounter: Payer: Medicare Other | Admitting: Family Medicine

## 2019-07-02 NOTE — Progress Notes (Signed)
   Subjective:    Patient ID: Shelly Jackson, female    DOB: [birth year], 85 y.o.   MRN: [id number]  Sinusitis This is a new problem.      Review of Systems     Objective:   Physical Exam        Assessment & Plan:   This encounter was created in error - please disregard.

## 2019-07-02 NOTE — Telephone Encounter (Signed)
Called and discussed with pt and she verbalized understanding.

## 2019-07-04 ENCOUNTER — Telehealth: Payer: Self-pay | Admitting: Family Medicine

## 2019-07-04 NOTE — Telephone Encounter (Signed)
Pt states she feels terrible but is doing better. States health dept called her and told her she could go out of quarantine on Monday. No fever, no sob, no pain. Diarrhea has finally stopped. She is able to eat and keep it down. States she does not need anything at this time. Told pt to call back if not continuing to improve or if getting worse. She states she will.

## 2019-07-04 NOTE — Telephone Encounter (Signed)
FYI-patient wanted you to know she tested positive on 2/22 for Covid.

## 2019-07-05 NOTE — Progress Notes (Signed)
This encounter was created in error - please disregard.

## 2019-07-11 ENCOUNTER — Ambulatory Visit (INDEPENDENT_AMBULATORY_CARE_PROVIDER_SITE_OTHER): Payer: Medicare Other | Admitting: Family Medicine

## 2019-07-11 ENCOUNTER — Encounter: Payer: Self-pay | Admitting: Family Medicine

## 2019-07-11 ENCOUNTER — Other Ambulatory Visit: Payer: Self-pay

## 2019-07-11 DIAGNOSIS — J310 Chronic rhinitis: Secondary | ICD-10-CM | POA: Diagnosis not present

## 2019-07-11 DIAGNOSIS — J329 Chronic sinusitis, unspecified: Secondary | ICD-10-CM | POA: Diagnosis not present

## 2019-07-11 MED ORDER — CEFDINIR 300 MG PO CAPS: 300.0000 mg | ORAL_CAPSULE | Freq: Two times a day (BID) | ORAL | 0 refills | Status: DC

## 2019-07-11 NOTE — Progress Notes (Signed)
   Subjective:  Audio only  Patient ID: ANALIESE DEMARTINI, female    DOB: November 07, 1934, 85 y.o.   MRN: [id number]  HPIpt states she was positive for covid about one week ago. Pt states she is about the same. Having diarrhea. Taking immodium. Feeling crappy. Has a cough. No sob. No fever. No chest pain.   Virtual Visit via Telephone Note  I connected with [name] on 07/11/19 at  4:10 PM EST by telephone and verified that I am speaking with the correct person using two identifiers.  Location: Patient: home Provider: office   I discussed the limitations, risks, security and privacy concerns of performing an evaluation and management service by telephone and the availability of in person appointments. I also discussed with the patient that there may be a patient responsible charge related to this service. The patient expressed understanding and agreed to proceed.   History of Present Illness:    Observations/Objective:   Assessment and Plan:   Follow Up Instructions:    I discussed the assessment and treatment plan with the patient. The patient was provided an opportunity to ask questions and all were answered. The patient agreed with the plan and demonstrated an understanding of the instructions.   The patient was advised to call back or seek an in-person evaluation if the symptoms worsen or if the condition fails to improve as anticipated.  I provide 22 minutes of non-face-to-face time during this encounter. Patient is on approximately day 10 of the Covid diagnosis.  Her son also had a.  He has now improved and is back to work as a Actor.  Patient notes no chest pain.  No shortness of breath.  No fever.  She does note headache frontal in nature.  Substantial nasal congestion    Review of Systems No vomiting no diarrhea no rash    Objective:   Physical Exam  Virtual     Assessment & Plan:  Impression Covid with persistent symptoms.  Claims no shortness  of breath.  Able to speak in full sentences easily on the phone.  Substantial sinus symptoms.  We will add an antibiotic.  Warning signs discussed carefully.

## 2019-07-25 LAB — HM DIABETES EYE EXAM

## 2019-07-31 ENCOUNTER — Telehealth: Payer: Self-pay | Admitting: Family Medicine

## 2019-07-31 MED ORDER — LANTUS SOLOSTAR 100 UNIT/ML ~~LOC~~ SOPN: PEN_INJECTOR | SUBCUTANEOUS | 1 refills | Status: DC

## 2019-07-31 MED ORDER — INSULIN SYRINGES (DISPOSABLE) U-100 1 ML MISC: 5 refills | Status: AC

## 2019-07-31 NOTE — Telephone Encounter (Signed)
Prescriptions sent electronically to pharmacy. Left message to return call 

## 2019-07-31 NOTE — Telephone Encounter (Signed)
Patient is requesting refill on insulin glargine 100 units and insulin syringes,disposable sent into [facility]. Almost out of insulin.

## 2019-07-31 NOTE — Telephone Encounter (Signed)
May have 6 months worth of refill

## 2019-08-02 NOTE — Telephone Encounter (Signed)
Patient notified

## 2019-09-03 ENCOUNTER — Other Ambulatory Visit: Payer: Self-pay

## 2019-09-03 ENCOUNTER — Telehealth: Payer: Self-pay | Admitting: *Deleted

## 2019-09-03 ENCOUNTER — Ambulatory Visit (INDEPENDENT_AMBULATORY_CARE_PROVIDER_SITE_OTHER): Payer: Medicare Other | Admitting: Family Medicine

## 2019-09-03 ENCOUNTER — Encounter: Payer: Self-pay | Admitting: Family Medicine

## 2019-09-03 VITALS — BP 130/80 | Temp 97.3°F | Wt 203.0 lb

## 2019-09-03 DIAGNOSIS — E1142 Type 2 diabetes mellitus with diabetic polyneuropathy: Secondary | ICD-10-CM

## 2019-09-03 DIAGNOSIS — E782 Mixed hyperlipidemia: Secondary | ICD-10-CM | POA: Diagnosis not present

## 2019-09-03 DIAGNOSIS — I10 Essential (primary) hypertension: Secondary | ICD-10-CM | POA: Diagnosis not present

## 2019-09-03 DIAGNOSIS — R300 Dysuria: Secondary | ICD-10-CM | POA: Diagnosis not present

## 2019-09-03 LAB — POCT URINALYSIS DIPSTICK
Spec Grav, UA: 1.01 (ref 1.010–1.025)
pH, UA: 6 (ref 5.0–8.0)

## 2019-09-03 LAB — POCT GLYCOSYLATED HEMOGLOBIN (HGB A1C): Hemoglobin A1C: 5.7 % — AB (ref 4.0–5.6)

## 2019-09-03 MED ORDER — PANTOPRAZOLE SODIUM 40 MG PO TBEC: DELAYED_RELEASE_TABLET | ORAL | 1 refills | Status: DC

## 2019-09-03 MED ORDER — LOSARTAN POTASSIUM 25 MG PO TABS: 50.0000 mg | ORAL_TABLET | Freq: Every day | ORAL | 1 refills | Status: DC

## 2019-09-03 MED ORDER — PRAVASTATIN SODIUM 10 MG PO TABS: 10.0000 mg | ORAL_TABLET | Freq: Every day | ORAL | 1 refills | Status: DC

## 2019-09-03 MED ORDER — METFORMIN HCL 1000 MG PO TABS: ORAL_TABLET | ORAL | 1 refills | Status: DC

## 2019-09-03 MED ORDER — ALPRAZOLAM 0.25 MG PO TABS: ORAL_TABLET | ORAL | 1 refills | Status: DC

## 2019-09-03 MED ORDER — NIFEDIPINE ER OSMOTIC RELEASE 60 MG PO TB24: 60.0000 mg | ORAL_TABLET | Freq: Every day | ORAL | 1 refills | Status: DC

## 2019-09-03 MED ORDER — CITALOPRAM HYDROBROMIDE 20 MG PO TABS: 20.0000 mg | ORAL_TABLET | Freq: Every day | ORAL | 1 refills | Status: DC

## 2019-09-03 MED ORDER — CEFDINIR 300 MG PO CAPS: 300.0000 mg | ORAL_CAPSULE | Freq: Two times a day (BID) | ORAL | 0 refills | Status: DC

## 2019-09-03 MED ORDER — LOSARTAN POTASSIUM 25 MG PO TABS: 50.0000 mg | ORAL_TABLET | Freq: Every day | ORAL | 3 refills | Status: DC

## 2019-09-03 MED ORDER — LOSARTAN POTASSIUM 25 MG PO TABS: 25.0000 mg | ORAL_TABLET | Freq: Every day | ORAL | 1 refills | Status: DC

## 2019-09-03 NOTE — Progress Notes (Signed)
Subjective:    Patient ID: Shelly Jackson, female    DOB: [birth year], 85 y.o.   MRN: [id number]  Diabetes She presents for her follow-up diabetic visit. She has type 2 diabetes mellitus. There are no hypoglycemic associated symptoms. Pertinent negatives for hypoglycemia include no confusion or dizziness. There are no diabetic associated symptoms. Pertinent negatives for diabetes include no chest pain, no fatigue, no polydipsia, no polyphagia and no weakness. There are no hypoglycemic complications. There are no diabetic complications. Risk factors for coronary artery disease include hypertension. She does not see a podiatrist.Eye exam is current.  Hypertension This is a chronic problem. Pertinent negatives include no chest pain or shortness of breath. Risk factors for coronary artery disease include diabetes mellitus. There are no compliance problems.    Pt also brought in urine sample. Pt states she has kidney stones at times and when she feels one coming on, she tries to catch them  Patient has history of kidney stones She denies any major setbacks currently She did have Covid earlier this spring Her energy level is subpar at times   Review of Systems  Constitutional: Negative for activity change, appetite change and fatigue.  HENT: Negative for congestion and rhinorrhea.   Respiratory: Negative for cough and shortness of breath.   Cardiovascular: Negative for chest pain and leg swelling.  Gastrointestinal: Negative for abdominal pain and diarrhea.  Endocrine: Negative for polydipsia and polyphagia.  Skin: Negative for color change.  Neurological: Negative for dizziness and weakness.  Psychiatric/Behavioral: Negative for behavioral problems and confusion.       Objective:   Physical Exam Vitals reviewed.  Constitutional:      General: She is not in acute distress. HENT:     Head: Normocephalic and atraumatic.  Eyes:     General:        Right eye: No discharge.        Left  eye: No discharge.  Neck:     Trachea: No tracheal deviation.  Cardiovascular:     Rate and Rhythm: Normal rate and regular rhythm.     Heart sounds: Normal heart sounds. No murmur.  Pulmonary:     Effort: Pulmonary effort is normal. No respiratory distress.     Breath sounds: Normal breath sounds.  Lymphadenopathy:     Cervical: No cervical adenopathy.  Skin:    General: Skin is warm and dry.  Neurological:     Mental Status: She is alert.     Coordination: Coordination normal.  Psychiatric:        Behavior: Behavior normal.    Fall Risk  05/02/2019 03/30/2019 03/29/2018 12/12/2017 06/29/2016  Falls in the past year? 1 0 1 Yes Yes  Comment - - Emmi Telephone Survey: data to providers prior to load Emmi Telephone Survey: data to providers prior to load -  Number falls in past yr: 1 - 1 2 or more 1  Comment - - Emmi Telephone Survey Actual Response = 1 Emmi Telephone Survey Actual Response = 3 -  Injury with Fall? 1 - 1 No No  Risk for fall due to : Impaired balance/gait;Impaired mobility;History of fall(s) Impaired balance/gait;Impaired mobility - - Impaired vision  Follow up Education provided;Falls evaluation completed Falls evaluation completed - - Falls prevention discussed          Assessment & Plan:  1. Type 2 diabetes mellitus with diabetic polyneuropathy, unspecified whether long term insulin use (HCC) A1c under decent control watch diet stay active - POCT  HgB A1C Medications were gone over with the patient one by one to help her with her understanding and her compliance 2. Dysuria Urine under the microscope does not show any sign of urinary tract infection - POCT Urinalysis Dipstick  3. HTN (hypertension), benign Blood pressure under good control currently continue current measures  4. Hyperlipemia, mixed No new labs needed.  Continue current medication.  Moderate obesity patient will watch diet 30 minutes spent with patient reviewing over multiple issues.   Continue current measures. Patient did have her Covid vaccine

## 2019-09-04 NOTE — Telephone Encounter (Signed)
error 

## 2019-09-14 ENCOUNTER — Other Ambulatory Visit: Payer: Self-pay

## 2019-09-14 ENCOUNTER — Ambulatory Visit (INDEPENDENT_AMBULATORY_CARE_PROVIDER_SITE_OTHER): Payer: Medicare Other | Admitting: Family Medicine

## 2019-09-14 ENCOUNTER — Encounter: Payer: Self-pay | Admitting: Family Medicine

## 2019-09-14 ENCOUNTER — Ambulatory Visit (HOSPITAL_COMMUNITY)
Admission: RE | Admit: 2019-09-14 | Discharge: 2019-09-14 | Disposition: A | Discharge status: Home / Self Care | Payer: Medicare Other | Source: Ambulatory Visit | Attending: Family Medicine | Admitting: Family Medicine

## 2019-09-14 VITALS — BP 130/80 | HR 52 | Temp 97.6°F | Ht 62.0 in | Wt 205.6 lb

## 2019-09-14 DIAGNOSIS — M79644 Pain in right finger(s): Secondary | ICD-10-CM

## 2019-09-14 DIAGNOSIS — W19XXXA Unspecified fall, initial encounter: Secondary | ICD-10-CM

## 2019-09-14 DIAGNOSIS — M79641 Pain in right hand: Secondary | ICD-10-CM | POA: Insufficient documentation

## 2019-09-14 MED ORDER — TRAMADOL HCL 50 MG PO TABS: 50.0000 mg | ORAL_TABLET | Freq: Three times a day (TID) | ORAL | 0 refills | Status: AC | PRN

## 2019-09-14 NOTE — Progress Notes (Signed)
Patient ID: Shelly Jackson, female    DOB: 12/24/1934, 85 y.o.   MRN: [id number]   Chief Complaint  Patient presents with  . Finger Injury    Pt here today for broken finger. Pt fell last Sunday and landed on right hand. Pt states that she was getting up to use the bathroom and when she sat down she fell foward. pt states that her finger did not start bothering her until Tuesday.    Subjective:    HPI Pt stating she was sleeping and woke up to use bedside commode and went forward and fell on rt hand when trying to go to bathroom.  Injury occurred 6 days ago.  Not feeling dizzy or lightheaded at time of fall.  No head injury or loc. Pt stating feeling well otherwise.  Has rt hand swelling and rt 5th digit pain. Pain in rt hand didn't start till 2 days ago.    Pt using rolling walker at baseline for ambulation. Pt using tylenol as needed for pain and use using warm epson salt soaks on the hand.  Minimal relief.   On review of chart pt has had fall in 11/19. Went to ER and had head contusion.  At that recommended using walker or cane for ambulation. Her xanax was decreased at that time from 0.68m to 0.275mxanax.   Medical History [place] a past medical history of Acid reflux, Anemia, Anxiety, Arthritis, CHF (congestive heart failure) ([facility] Diabetes mellitus, Diabetic retinopathy ([facility] Diverticulitis (07/24/11), History of kidney stones, Hypertension, Obesity, Rheumatoid arthritis(714.0), Sleep apnea, [place]rombocytopenia ([facility] [place] and Tubular adenoma of colon (07/24/11).   Outpatient Encounter Medications as of 09/14/2019  Medication Sig  . Accu-Chek Softclix Lancets lancets Use as instructed  . ALPRAZolam (XANAX) 0.25 MG tablet One po Qhs prn  . aspirin 81 MG tablet Take 81 mg by mouth daily.  . B-D UF III MINI PEN NEEDLES 31G X 5 MM MISC USE AS DIRECTED  . blood glucose meter kit and supplies Dispense based on patient and insurance preference. Use to test blood sugar 3 times a day (FOR  ICD-10 E10.9, E11.9).  . cefdinir (OMNICEF) 300 MG capsule Take 1 capsule (300 mg total) by mouth 2 (two) times daily.  . citalopram (CELEXA) 20 MG tablet Take 1 tablet (20 mg total) by mouth daily.  . Marland Kitchenlucose blood (ONE TOUCH ULTRA TEST) test strip TEST BLOOD SUGAR THREE TIMES DAILY  . glucose blood test strip Use as instructed  . insulin glargine (LANTUS SOLOSTAR) 100 UNIT/ML Solostar Pen INJECT SUBCUTANEOUSLY 48  UNITS AT BEDTIME  . Insulin Syringes, Disposable, U-100 1 ML MISC Use to administer insulin  . losartan (COZAAR) 25 MG tablet Take 1 tablet (25 mg total) by mouth daily.  . metFORMIN (GLUCOPHAGE) 1000 MG tablet TAKE 1 TABLET BY MOUTH IN  THE MORNING AND  1/2 TABLET AT NIGHT  . NIFEdipine (PROCARDIA XL/NIFEDICAL XL) 60 MG 24 hr tablet Take 1 tablet (60 mg total) by mouth daily.  . pantoprazole (PROTONIX) 40 MG tablet TAKE 1 TABLET(40 MG) BY MOUTH DAILY  . pravastatin (PRAVACHOL) 10 MG tablet Take 1 tablet (10 mg total) by mouth daily.  . Tamsulosin HCl (FLOMAX PO) Take by mouth.  . triamcinolone cream (KENALOG) 0.1 % Apply thin amount twice a day as needed to help with itching around the umbilicus  . [DISCONTINUED] traMADol (ULTRAM) 50 MG tablet TAKE 1 TABLET BY MOUTH THREE TIMES DAILY AS NEEDED FOR SEVERE PAIN. USE SPARINGLY  .  traMADol (ULTRAM) 50 MG tablet Take 1 tablet (50 mg total) by mouth every 8 (eight) hours as needed for up to 5 days for severe pain.   No facility-administered encounter medications on file as of 09/14/2019.     Review of Systems  Constitutional: Negative for chills and fever.  Musculoskeletal: Negative for back pain, gait problem, myalgias and neck pain.       Rt hand/5th digit pain.  Skin: Negative for rash and wound.  Neurological: Negative for dizziness, speech difficulty, weakness, light-headedness and headaches.     Vitals BP 130/80   Pulse (!) 52   Temp 97.6 F (36.4 C)   Ht '5\' 2"'  (1.575 m)   Wt 205 lb 9.6 oz (93.3 kg)   SpO2 98%   BMI  37.60 kg/m   Objective:   Physical Exam Vitals and nursing note reviewed.  Constitutional:      Appearance: Normal appearance.  Musculoskeletal:        General: Tenderness and signs of injury present.     Comments: +dec rom with rt 5th digit, +swelling.  +ecchymosis and swelling of the rt lateral hand, ttp over mcp, 5th.  Normal rom of rt wrist/elbow.  Nvi, cap refill nl, and +2 radial pulse.   Skin:    General: Skin is warm and dry.     Capillary Refill: Capillary refill takes less than 2 seconds.  Neurological:     General: No focal deficit present.     Mental Status: She is alert and oriented to person, place, and time.     Cranial Nerves: No cranial nerve deficit.     Sensory: No sensory deficit.     Gait: Gait abnormal (using rolling walker).  Psychiatric:        Mood and Affect: Mood normal.        Behavior: Behavior normal.      Assessment and Plan   1. Hand pain, right - DG Hand Complete Right; Future - DG Finger Little Right; Future - traMADol (ULTRAM) 50 MG tablet; Take 1 tablet (50 mg total) by mouth every 8 (eight) hours as needed for up to 5 days for severe pain.  Dispense: 30 tablet; Refill: 0  2. Fall at home, initial encounter  3. Finger pain, right - DG Finger Little Right; Future    Advising to take tramadol, has tolerated in past.  Pt stating has ran out of medication, last filled 12/20. Add tylenol prn. Ice, rest, hand, 10-20 mins 3x per day. Will call pt with results. Pt will call with cousins number she wants her results called to.  Concern for increased falls- May need to re-visit her xanax at night, and decrease or discontinue based on recent falls.    [place], DO 09/14/2019

## 2019-09-18 ENCOUNTER — Telehealth: Payer: Self-pay | Admitting: Family Medicine

## 2019-09-18 NOTE — Telephone Encounter (Signed)
Nurses Based on previous message make sure to let the pharmacy know that we are no longer using the Xanax

## 2019-09-18 NOTE — Telephone Encounter (Signed)
Nurses Recently it was brought to my attention that the patient fell at nighttime She was seen by Dr. Lovena Le Please let the patient know that current studies show that taking sleep medicine at nighttime is increased risk of falls especially in older adults and therefore can be dangerous Because of this we will no longer be able to continue the Xanax.  Please cancel the Xanax. I certainly sympathize with older adults who cannot sleep but sleeping pills and nerve pills for sleep is no longer recommended. The patient can follow-up this summer to discuss further-June

## 2019-09-19 NOTE — Telephone Encounter (Signed)
Prescription refills for xanax cancelled at pharmacy.

## 2019-09-19 NOTE — Telephone Encounter (Addendum)
Prescription refills for xanax cancelled at pharmacy.  Telephone call -voicemail is full

## 2019-09-19 NOTE — Addendum Note (Signed)
Addended by: Dairl Ponder on: 09/19/2019 10:50 AM   Modules accepted: Orders

## 2019-09-20 NOTE — Telephone Encounter (Signed)
Patient advised per Dr Nicki Reaper:  that current studies show that taking sleep medicine at nighttime is increased risk of falls especially in older adults and therefore can be dangerous Because of this we will no longer be able to continue the Xanax.  Please cancel the Xanax. Dr Nicki Reaper can certainly sympathize with older adults who cannot sleep but sleeping pills and nerve pills for sleep is no longer recommended. The patient can follow-up this summer to discuss further-June  Patient verbalized understanding.

## 2019-09-21 ENCOUNTER — Encounter: Payer: Self-pay | Admitting: Family Medicine

## 2019-09-21 ENCOUNTER — Other Ambulatory Visit: Payer: Self-pay

## 2019-09-21 ENCOUNTER — Ambulatory Visit (HOSPITAL_COMMUNITY)
Admission: RE | Admit: 2019-09-21 | Discharge: 2019-09-21 | Disposition: A | Discharge status: Home / Self Care | Payer: Medicare Other | Source: Ambulatory Visit | Attending: Family Medicine | Admitting: Family Medicine

## 2019-09-21 ENCOUNTER — Telehealth: Payer: Self-pay | Admitting: Family Medicine

## 2019-09-21 ENCOUNTER — Ambulatory Visit (INDEPENDENT_AMBULATORY_CARE_PROVIDER_SITE_OTHER): Payer: Medicare Other | Admitting: Family Medicine

## 2019-09-21 VITALS — BP 122/82 | HR 92 | Ht 62.0 in | Wt 205.0 lb

## 2019-09-21 DIAGNOSIS — S20222A Contusion of left back wall of thorax, initial encounter: Secondary | ICD-10-CM

## 2019-09-21 DIAGNOSIS — I110 Hypertensive heart disease with heart failure: Secondary | ICD-10-CM | POA: Diagnosis not present

## 2019-09-21 DIAGNOSIS — I509 Heart failure, unspecified: Secondary | ICD-10-CM | POA: Diagnosis not present

## 2019-09-21 DIAGNOSIS — S0003XA Contusion of scalp, initial encounter: Secondary | ICD-10-CM | POA: Insufficient documentation

## 2019-09-21 DIAGNOSIS — M25552 Pain in left hip: Secondary | ICD-10-CM

## 2019-09-21 DIAGNOSIS — W19XXXA Unspecified fall, initial encounter: Secondary | ICD-10-CM | POA: Diagnosis not present

## 2019-09-21 DIAGNOSIS — R2689 Other abnormalities of gait and mobility: Secondary | ICD-10-CM

## 2019-09-21 DIAGNOSIS — I6782 Cerebral ischemia: Secondary | ICD-10-CM | POA: Insufficient documentation

## 2019-09-21 DIAGNOSIS — R42 Dizziness and giddiness: Secondary | ICD-10-CM | POA: Diagnosis not present

## 2019-09-21 DIAGNOSIS — E118 Type 2 diabetes mellitus with unspecified complications: Secondary | ICD-10-CM | POA: Diagnosis not present

## 2019-09-21 NOTE — Telephone Encounter (Signed)
Dr Follow up appt made for 6/4

## 2019-09-21 NOTE — Telephone Encounter (Signed)
Shelly Jackson called and wanted to talk to the nurse about her x-ray she had done today.

## 2019-09-21 NOTE — Progress Notes (Signed)
Patient ID: Shelly Jackson, female    DOB: [birth year], 85 y.o.   MRN: [id number]   Chief Complaint  Patient presents with  . Fall    earlier in the week- left leg hurts  . Hip Pain    left   Subjective:    HPI Pt seen with cousin/caregiver. Pt stating has left hip pain/left lower abd pain from recent fall 5 days ago.  Just had a fall last week and had fracture to rt hand and currently has rt hand cast. Pt stating not sure why she is falling.  She feels dizzy at times and once she starts falling she goes down, unable to catch her balance.  Walking okay and not hurting in the back/hip. Hurting to bend down or flex at hip. Fall- 1 wk ago broken the rt finger. Feel Monday night again- getting out of car and ""swung around too fast" and fell btw car and porch. Landed on left side. Laid there an hour and forgot her life alert. Finally hit life alert. Ems came and pt declined the ER visit. Didn't hurt time and now hurting from the fall 4 days later.  Only complaining of left hip pain.  PCP called last week and advising to stop the xanax at night. Due to increase in falls.  Only taking prn in the last few months. Pt stating she threw away medication this week.  Pt received a call on 09/18/19- needing to stop taking xanax at night.  When falling, "can't stop." Falling backwards or to the side at times, when at church.   Pt mentioned that her son told her "if she keeps having falls she will have to go to a nursing home."    Medical History Alette has a past medical history of Acid reflux, Anemia, Anxiety, Arthritis, CHF (congestive heart failure) ([place]), Diabetes mellitus, Diabetic retinopathy ([place]), Diverticulitis (07/24/11), History of kidney stones, Hypertension, Obesity, Rheumatoid arthritis(714.0), Sleep apnea, Thrombocytopenia ([place]), and Tubular adenoma of colon (07/24/11).   Outpatient Encounter Medications as of 09/21/2019  Medication Sig  . Accu-Chek Softclix Lancets lancets Use  as instructed  . aspirin 81 MG tablet Take 81 mg by mouth daily.  . B-D UF III MINI PEN NEEDLES 31G X 5 MM MISC USE AS DIRECTED  . blood glucose meter kit and supplies Dispense based on patient and insurance preference. Use to test blood sugar 3 times a day (FOR ICD-10 E10.9, E11.9).  . cefdinir (OMNICEF) 300 MG capsule Take 1 capsule (300 mg total) by mouth 2 (two) times daily.  . citalopram (CELEXA) 20 MG tablet Take 1 tablet (20 mg total) by mouth daily.  Marland Kitchen glucose blood (ONE TOUCH ULTRA TEST) test strip TEST BLOOD SUGAR THREE TIMES DAILY  . glucose blood test strip Use as instructed  . insulin glargine (LANTUS SOLOSTAR) 100 UNIT/ML Solostar Pen INJECT SUBCUTANEOUSLY 48  UNITS AT BEDTIME  . Insulin Syringes, Disposable, U-100 1 ML MISC Use to administer insulin  . losartan (COZAAR) 25 MG tablet Take 1 tablet (25 mg total) by mouth daily.  . metFORMIN (GLUCOPHAGE) 1000 MG tablet TAKE 1 TABLET BY MOUTH IN  THE MORNING AND  1/2 TABLET AT NIGHT  . NIFEdipine (PROCARDIA XL/NIFEDICAL XL) 60 MG 24 hr tablet Take 1 tablet (60 mg total) by mouth daily.  . pantoprazole (PROTONIX) 40 MG tablet TAKE 1 TABLET(40 MG) BY MOUTH DAILY  . pravastatin (PRAVACHOL) 10 MG tablet Take 1 tablet (10 mg total) by mouth daily.  . Tamsulosin  HCl (FLOMAX PO) Take by mouth.  . triamcinolone cream (KENALOG) 0.1 % Apply thin amount twice a day as needed to help with itching around the umbilicus   No facility-administered encounter medications on file as of 09/21/2019.     Review of Systems  Constitutional: Negative for chills and fever.  HENT: Negative for congestion, rhinorrhea and sore throat.        +healing scalp laceration  Eyes: Negative for visual disturbance.  Respiratory: Negative for cough, shortness of breath and wheezing.   Cardiovascular: Negative for chest pain and leg swelling.  Gastrointestinal: Negative for abdominal pain, diarrhea, nausea and vomiting.       Left lower inguinal pain/left hip pain.    Genitourinary: Negative for dysuria and frequency.  Musculoskeletal: Negative for arthralgias, back pain and neck pain.       +left hip pain.  Skin: Negative for rash.       Bruising on left upper back/mid back.    Neurological: Positive for dizziness. Negative for tremors, seizures, syncope, facial asymmetry, speech difficulty, weakness, light-headedness, numbness and headaches.     Vitals BP 122/82   Pulse 92   Ht 5' 2" (1.575 m)   Wt 205 lb (93 kg)   SpO2 96%   BMI 37.49 kg/m   Objective:   Physical Exam Vitals and nursing note reviewed.  Constitutional:      General: She is not in acute distress.    Appearance: Normal appearance. She is not ill-appearing.  HENT:     Head: Normocephalic.     Comments: +1cm laceration with scabbing on right posterior occiput. No hematoma or swelling.    Nose: Nose normal.     Mouth/Throat:     Mouth: Mucous membranes are moist.     Pharynx: Oropharynx is clear.  Eyes:     Extraocular Movements: Extraocular movements intact.     Conjunctiva/sclera: Conjunctivae normal.     Pupils: Pupils are equal, round, and reactive to light.  Cardiovascular:     Rate and Rhythm: Normal rate and regular rhythm.     Pulses: Normal pulses.     Heart sounds: Normal heart sounds. No murmur.  Pulmonary:     Effort: Pulmonary effort is normal.     Breath sounds: Normal breath sounds. No wheezing, rhonchi or rales.  Abdominal:     General: There is no distension.     Palpations: Abdomen is soft. There is no mass.     Tenderness: There is abdominal tenderness (left inguinal area and left hip). There is no guarding or rebound.     Hernia: No hernia is present.     Comments: No ecchymosis or hematoma on abdomen/flank  Musculoskeletal:        General: Signs of injury (left hip, left back, posterior scalp) present. No swelling or tenderness. Normal range of motion.     Cervical back: Normal range of motion and neck supple. No tenderness.     Right lower  leg: No edema.     Left lower leg: No edema.     Comments: +dec rom with flexion of hip on left. +rt hand cast in place.  Skin:    General: Skin is warm and dry.     Findings: Bruising (left thoracic back) present. No erythema, lesion or rash.  Neurological:     General: No focal deficit present.     Mental Status: She is alert and oriented to person, place, and time.     Cranial Nerves:   No cranial nerve deficit.     Sensory: No sensory deficit.     Motor: No weakness.     Gait: Gait abnormal (at baseline using rolling walker).  Psychiatric:        Mood and Affect: Mood normal.        Behavior: Behavior normal.      Assessment and Plan   1. Fall, initial encounter - CT Head Wo Contrast - DG Thoracic Spine W/Swimmers - DG HIP UNILAT WITH PELVIS 2-3 VIEWS LEFT  2. Contusion of scalp, initial encounter - CT Head Wo Contrast  3. Contusion of left side of back, initial encounter - DG Thoracic Spine W/Swimmers  4. Hip pain, left - DG HIP UNILAT WITH PELVIS 2-3 VIEWS LEFT  5. Imbalance - CT Head Wo Contrast  6. Dizziness - CT Head Wo Contrast    Multiple falls since 12/20. More recently in past 2 weeks increase in falls.   2nd fall in the last 2 wks- with left hip pain, left back bruising, and contusion to head. Ordered CT head, xray thoracic and left hip.  Continue to use rolling walker and cane. No further driving till fully evaluated from the falls.  Statements from her son stating she may need nursing home, may be reason for pt not coming for evaluation of falls/pain for days-week later, worried that may need nursing home and not wanting to have change in living environment/loss of independence.  Advising pt to avoid taking tramadol and just take tylenol 548m q4-6hrs prn pain.  May take tramadol if she tells her son/caregiver and they are at home to monitor and if tylenol is not working.  Pt and cousin in agreement with plan.  F/u 2-3 wk with pcp for follow up on  falls.

## 2019-09-21 NOTE — Telephone Encounter (Signed)
See result note.  

## 2019-09-24 ENCOUNTER — Telehealth: Payer: Self-pay | Admitting: Family Medicine

## 2019-09-24 NOTE — Telephone Encounter (Signed)
Left side pain has become steady. Pt has been using pain pills and that helps her sleep but when she wakes up the pain is still there. Pt has been using heat also and that helps her. Pt would like something called in to help with the pain. Please advise. Thank you  [facility] [address]

## 2019-09-24 NOTE — Telephone Encounter (Signed)
Pain in her side and she is in a lot of pain and needs to speak with a nurse. Patient was seen on Friday said the pain is back.

## 2019-09-25 MED ORDER — DICLOFENAC SODIUM 75 MG PO TBEC
DELAYED_RELEASE_TABLET | ORAL | 0 refills | Status: DC
Start: 2019-09-25 — End: 2019-10-12

## 2019-09-25 NOTE — Telephone Encounter (Signed)
Medication sent to pharmacy and pt is aware 

## 2019-09-25 NOTE — Telephone Encounter (Signed)
All notes/labs/imaging reviewed. May add prescription strength anti inflam short term for injuries. Voltaren 75 one bid with food, numb 20, may still take tylenol with this

## 2019-10-12 ENCOUNTER — Other Ambulatory Visit: Payer: Self-pay

## 2019-10-12 ENCOUNTER — Telehealth: Payer: Self-pay | Admitting: *Deleted

## 2019-10-12 ENCOUNTER — Encounter: Payer: Self-pay | Admitting: Family Medicine

## 2019-10-12 ENCOUNTER — Ambulatory Visit (INDEPENDENT_AMBULATORY_CARE_PROVIDER_SITE_OTHER): Payer: Medicare Other | Admitting: Family Medicine

## 2019-10-12 VITALS — BP 136/70 | Temp 97.4°F | Ht 62.0 in | Wt 195.0 lb

## 2019-10-12 DIAGNOSIS — I951 Orthostatic hypotension: Secondary | ICD-10-CM

## 2019-10-12 DIAGNOSIS — R296 Repeated falls: Secondary | ICD-10-CM

## 2019-10-12 DIAGNOSIS — R109 Unspecified abdominal pain: Secondary | ICD-10-CM | POA: Diagnosis not present

## 2019-10-12 LAB — POCT URINALYSIS DIPSTICK
Spec Grav, UA: >=1.03 — AB (ref 1.010–1.025)
pH, UA: 5 (ref 5.0–8.0)

## 2019-10-12 MED ORDER — AMLODIPINE BESYLATE 2.5 MG PO TABS
2.5000 mg | ORAL_TABLET | Freq: Every day | ORAL | 1 refills | Status: DC
Start: 2019-10-12 — End: 2019-10-12

## 2019-10-12 MED ORDER — AMLODIPINE BESYLATE 2.5 MG PO TABS: 2.5000 mg | ORAL_TABLET | Freq: Every day | ORAL | 0 refills | Status: DC

## 2019-10-12 NOTE — Telephone Encounter (Signed)
Left message to return call to let pt know mail order said it would be about 5 days to get amlodipine rx from [facility] so a 7 day supply was sent to [facility] in [place].

## 2019-10-12 NOTE — Progress Notes (Addendum)
   Subjective:    Patient ID: Shelly Jackson, female    DOB: [birth year], 85 y.o.   MRN: [id number]  HPIFollow up on falls. Pt states she has had 3 falls in the past year with some injuries. She does have life alert.  Patient is trying to be more careful when she moves around Fall Risk  10/12/2019 09/03/2019 05/02/2019 03/30/2019 03/29/2018  Falls in the past year? 1 1 1  0 1  Comment - - - - Emmi Telephone Survey: data to providers prior to load  Number falls in past yr: 1 0 1 - 1  Comment - - - - Emmi Telephone Survey Actual Response = 1  Injury with Fall? 1 0 1 - 1  Risk for fall due to : History of fall(s);Impaired balance/gait;Impaired mobility Impaired balance/gait;Impaired mobility;Impaired vision Impaired balance/gait;Impaired mobility;History of fall(s) Impaired balance/gait;Impaired mobility -  Follow up Falls evaluation completed;Education provided Falls evaluation completed;Education provided Education provided;Falls evaluation completed Falls evaluation completed -     Pt brought in a urine specimen. Having pain on both sides but also states it started after her fall 3 -4 weeks ago. Pt states DR. Cambell her ortho states he will xray her sides next visit in 2 weeks. She is seeing him every 3 weeks.   Patient does relate getting dizzy at times when she is standing Results for orders placed or performed in visit on 10/12/19  POCT urinalysis dipstick  Result Value Ref Range   Color, UA     Clarity, UA     Glucose, UA     Bilirubin, UA ++    Ketones, UA     Spec Grav, UA >=1.030 (A) 1.010 - 1.025   Blood, UA     pH, UA 5.0 5.0 - 8.0   Protein, UA     Urobilinogen, UA     Nitrite, UA     Leukocytes, UA     Appearance     Odor          Review of Systems Dizziness fatigue tiredness frequent falls    Objective:   Physical Exam  Lungs clear respiratory rate normal heart regular blood pressure sitting and standing shows significant drop consistent with orthostatic  hypotension       Assessment & Plan:  Orthostatic hypotension stop nifedipine. Recommend amlodipine 2.5 mg tablet daily recommend follow-up in 4 to 6 weeks  Frequent falls hopefully this will get better with less orthostasis use walker Recheck again in 4 to 6 weeks

## 2019-10-15 NOTE — Telephone Encounter (Signed)
Pt.notified

## 2019-10-23 ENCOUNTER — Telehealth: Payer: Self-pay | Admitting: Family Medicine

## 2019-10-23 ENCOUNTER — Other Ambulatory Visit: Payer: Self-pay

## 2019-10-23 MED ORDER — BD PEN NEEDLE MINI U/F 31G X 5 MM MISC: 1 refills | Status: DC

## 2019-10-23 NOTE — Telephone Encounter (Signed)
Need the needles for insulin glargine (LANTUS SOLOSTAR) 100 UNIT/ML Solostar Pe needs to be called in or sent to [facility] in [place]. She is out of needles. She been out last night and today.  PT Call back [phone]

## 2019-10-23 NOTE — Telephone Encounter (Signed)
Pt needing just needles sent to pharmacy. Needles sent to [facility] [address] and pt is aware

## 2019-10-25 ENCOUNTER — Telehealth: Payer: Self-pay | Admitting: Family Medicine

## 2019-10-25 DIAGNOSIS — M810 Age-related osteoporosis without current pathological fracture: Secondary | ICD-10-CM

## 2019-10-25 NOTE — Telephone Encounter (Signed)
Patient due for bone density please set up for this summer   It is fine if the patient wants to do this in July or August Reason of osteoporosis

## 2019-10-26 NOTE — Telephone Encounter (Signed)
Bone density test scheduled at [facility] 12/03/19 arrive at 1:15 for 1:30 appointment. No calcium supplement 24-48 hrs before, comfortable 2 pc clothing and complete med list.  Pt contacted and verbalized understanding

## 2019-10-26 NOTE — Telephone Encounter (Signed)
Left message to return call 

## 2019-10-26 NOTE — Addendum Note (Signed)
Addended by: Vicente Males on: 10/26/2019 09:41 AM   Modules accepted: Orders

## 2019-10-26 NOTE — Addendum Note (Signed)
Addended by: Vicente Males on: 10/26/2019 09:32 AM   Modules accepted: Orders

## 2019-11-19 ENCOUNTER — Ambulatory Visit (INDEPENDENT_AMBULATORY_CARE_PROVIDER_SITE_OTHER): Payer: Medicare Other | Admitting: Ophthalmology

## 2019-11-19 ENCOUNTER — Other Ambulatory Visit: Payer: Self-pay

## 2019-11-19 ENCOUNTER — Encounter (INDEPENDENT_AMBULATORY_CARE_PROVIDER_SITE_OTHER): Payer: Self-pay | Admitting: Ophthalmology

## 2019-11-19 DIAGNOSIS — E113512 Type 2 diabetes mellitus with proliferative diabetic retinopathy with macular edema, left eye: Secondary | ICD-10-CM | POA: Insufficient documentation

## 2019-11-19 DIAGNOSIS — E113511 Type 2 diabetes mellitus with proliferative diabetic retinopathy with macular edema, right eye: Secondary | ICD-10-CM | POA: Insufficient documentation

## 2019-11-19 DIAGNOSIS — H3563 Retinal hemorrhage, bilateral: Secondary | ICD-10-CM | POA: Diagnosis not present

## 2019-11-19 NOTE — Progress Notes (Signed)
11/19/2019     CHIEF COMPLAINT Patient presents for Retina Follow Up   HISTORY OF PRESENT ILLNESS: Shelly Jackson is a 85 y.o. female who presents to the clinic today for:   HPI    Retina Follow Up    Patient presents with  Diabetic Retinopathy.  In both eyes.  Duration of 4 months.  Since onset it is stable.          Comments    4 month follow up - OCT OU Patient denies change in vision and overall has no complaints. A1C 5.7 /// LBS 127        Last edited by Gerda Diss on 11/19/2019  1:40 PM. (History)      Referring physician: Kathyrn Drown, MD [address] [place],  [place] [address]  HISTORICAL INFORMATION:   Selected notes from the MEDICAL RECORD NUMBER    Lab Results  Component Value Date   HGBA1C 5.7 (A) 09/03/2019     CURRENT MEDICATIONS: No current outpatient medications on file. (Ophthalmic Drugs)   No current facility-administered medications for this visit. (Ophthalmic Drugs)   Current Outpatient Medications (Other)  Medication Sig  . Accu-Chek Softclix Lancets lancets Use as instructed  . amLODipine (NORVASC) 2.5 MG tablet Take 1 tablet (2.5 mg total) by mouth daily.  Marland Kitchen aspirin 81 MG tablet Take 81 mg by mouth daily.  . blood glucose meter kit and supplies Dispense based on patient and insurance preference. Use to test blood sugar 3 times a day (FOR ICD-10 E10.9, E11.9).  . citalopram (CELEXA) 20 MG tablet Take 1 tablet (20 mg total) by mouth daily.  Marland Kitchen glucose blood (ONE TOUCH ULTRA TEST) test strip TEST BLOOD SUGAR THREE TIMES DAILY  . glucose blood test strip Use as instructed  . insulin glargine (LANTUS SOLOSTAR) 100 UNIT/ML Solostar Pen INJECT SUBCUTANEOUSLY 48  UNITS AT BEDTIME  . Insulin Pen Needle (B-D UF III MINI PEN NEEDLES) 31G X 5 MM MISC USE AS DIRECTED  . Insulin Syringes, Disposable, U-100 1 ML MISC Use to administer insulin  . losartan (COZAAR) 25 MG tablet Take 1 tablet (25 mg total) by mouth daily.  . metFORMIN  (GLUCOPHAGE) 1000 MG tablet TAKE 1 TABLET BY MOUTH IN  THE MORNING AND  1/2 TABLET AT NIGHT  . pantoprazole (PROTONIX) 40 MG tablet TAKE 1 TABLET(40 MG) BY MOUTH DAILY  . pravastatin (PRAVACHOL) 10 MG tablet Take 1 tablet (10 mg total) by mouth daily.  Marland Kitchen triamcinolone cream (KENALOG) 0.1 % Apply thin amount twice a day as needed to help with itching around the umbilicus   No current facility-administered medications for this visit. (Other)      REVIEW OF SYSTEMS:    ALLERGIES Allergies  Allergen Reactions  . Daypro [Oxaprozin]     Indigestion     PAST MEDICAL HISTORY Past Medical History:  Diagnosis Date  . Acid reflux   . Anemia    Secondary to acute blood loss  . Anxiety   . Arthritis    RA  . CHF (congestive heart failure) ([place])   . Diabetes mellitus   . Diabetic retinopathy ([place])   . Diverticulitis 07/24/11   Inpatient [facility]  . History of kidney stones   . Hypertension   . Obesity   . Rheumatoid arthritis(714.0)   . Sleep apnea   . Thrombocytopenia ([place])   . Tubular adenoma of colon 07/24/11   Past Surgical History:  Procedure Laterality Date  . APPENDECTOMY    .  CATARACT EXTRACTION Bilateral (right 08/2014) ( left 2005)  . CHOLECYSTECTOMY    . COLONOSCOPY  07/24/2011   Dr. Deatra Ina MM polyp at proximal ascending colon could not be removed because of difficult approach, pancolonic diverticulosis with diverticulitis involving the ascending colon, and suspected diverticular bleed, 7 mm polyp snared from proximal sigmoid colon with hemorrhagic surface found to be a tubular adenoma, small external hemorrhoids  . COLONOSCOPY  08/2007   Dr. Malva Limes hyperplastic polyp  . COLONOSCOPY N/A 12/14/2012   Procedure: COLONOSCOPY;  Surgeon: Daneil Dolin, MD;  Location[facility];  Service: Endoscopy;  Laterality: N/A;  8:30  . CYSTOSCOPY/RETROGRADE/URETEROSCOPY Right 06/27/2017   Procedure: CYSTOSCOPY/RIGHT RETROGRADE PYELOGRAM/RIGHT URETEROSCOPY;  Surgeon: Cleon Gustin, MD;  Location: [facility];  Service: Urology;  Laterality: Right;  . HOLMIUM LASER APPLICATION Right 01/23/568   Procedure: HOLMIUM LASER LITHOTRIPSY RIGHT RENAL CALCULUS;  Surgeon: Cleon Gustin, MD;  Location: [facility];  Service: Urology;  Laterality: Right;  . TONSILLECTOMY      FAMILY HISTORY Family History  Problem Relation Age of Onset  . Coronary artery disease Father   . Heart attack Father   . Cirrhosis Mother   . Hypertension Mother   . Colon cancer Neg Hx     SOCIAL HISTORY Social History   Tobacco Use  . Smoking status: Never Smoker  . Smokeless tobacco: Never Used  . Tobacco comment: Never  Vaping Use  . Vaping Use: Never used  Substance Use Topics  . Alcohol use: No  . Drug use: No         OPHTHALMIC EXAM: Base Eye Exam    Visual Acuity (Snellen - Linear)      Right Left   Dist [place] 20/60+2 20/200+1   Dist ph [place] 20/40+2 20/70+2       Tonometry (Tonopen, 1:50 PM)      Right Left   Pressure 15 16       Pupils      Pupils Dark Light Shape React APD   Right PERRL 5 4 Round Slow None   Left PERRL 3 3 Round Slow None       Visual Fields (Counting fingers)      Left Right    Full Full       Extraocular Movement      Right Left    Full Full       Neuro/Psych    Oriented x3: Yes   Mood/Affect: Normal       Dilation    Both eyes: 1.0% Mydriacyl, 2.5% Phenylephrine @ 1:50 PM        Slit Lamp and Fundus Exam    External Exam      Right Left   External Normal Normal       Slit Lamp Exam      Right Left   Lids/Lashes Normal Normal   Conjunctiva/Sclera White and quiet White and quiet   Cornea Clear Clear   Anterior Chamber Deep and quiet Deep and quiet   Iris Round and reactive Round and reactive   Lens Posterior chamber intraocular lens Posterior chamber intraocular lens   Anterior Vitreous Normal Normal       Fundus Exam      Right Left   Posterior Vitreous Posterior vitreous detachment Posterior vitreous detachment     Disc Normal Normal   C/D Ratio 0.25 0.25   Macula Microaneurysms Microaneurysms   Vessels PDR-quiet PDR-quiet   Periphery PRP,  attached, PRP,  attached,          IMAGING AND PROCEDURES  Imaging and Procedures for 11/19/19  OCT, Retina - OU - Both Eyes       Right Eye Quality was good. Scan locations included subfoveal. Central Foveal Thickness: 319. Findings include normal observations.   Left Eye Quality was good. Scan locations included subfoveal. Central Foveal Thickness: 306. Findings include normal observations.   Notes No active CSME, stable over time.  Will observe                ASSESSMENT/PLAN:  No problem-specific Assessment & Plan notes found for this encounter.      ICD-10-CM   1. Diabetic macular edema of right eye with proliferative retinopathy associated with type 2 diabetes mellitus (HCC)  E11.3511 OCT, Retina - OU - Both Eyes  2. Proliferative diabetic retinopathy of left eye with macular edema associated with type 2 diabetes mellitus (HCC)  J68.1157 OCT, Retina - OU - Both Eyes  3. Retinal hemorrhage, bilateral  H35.63 OCT, Retina - OU - Both Eyes    1.  2.  3.  Ophthalmic Meds Ordered this visit:  No orders of the defined types were placed in this encounter.      No follow-ups on file.  There are no Patient Instructions on file for this visit.   Explained the diagnoses, plan, and follow up with the patient and they expressed understanding.  Patient expressed understanding of the importance of proper follow up care.   Clent Demark Taelor Moncada M.D. Diseases & Surgery of the Retina and Vitreous [facility] 11/19/19     Abbreviations: M myopia (nearsighted); A astigmatism; H hyperopia (farsighted); P presbyopia; Mrx spectacle prescription;  CTL contact lenses; OD right eye; OS left eye; OU both eyes  XT exotropia; ET esotropia; PEK punctate epithelial keratitis; PEE punctate epithelial erosions; DES dry eye syndrome;  MGD meibomian gland dysfunction; ATs artificial tears; PFAT's preservative free artificial tears; [place] nuclear sclerotic cataract; PSC posterior subcapsular cataract; ERM epi-retinal membrane; PVD posterior vitreous detachment; RD retinal detachment; DM diabetes mellitus; DR diabetic retinopathy; NPDR non-proliferative diabetic retinopathy; PDR proliferative diabetic retinopathy; CSME clinically significant macular edema; DME diabetic macular edema; dbh dot blot hemorrhages; CWS cotton wool spot; POAG primary open angle glaucoma; C/D cup-to-disc ratio; HVF humphrey visual field; GVF goldmann visual field; OCT optical coherence tomography; IOP intraocular pressure; BRVO Branch retinal vein occlusion; CRVO central retinal vein occlusion; CRAO central retinal artery occlusion; BRAO branch retinal artery occlusion; RT retinal tear; SB scleral buckle; PPV pars plana vitrectomy; VH Vitreous hemorrhage; PRP panretinal laser photocoagulation; IVK intravitreal kenalog; VMT vitreomacular traction; MH Macular hole;  NVD neovascularization of the disc; NVE neovascularization elsewhere; AREDS age related eye disease study; ARMD age related macular degeneration; POAG primary open angle glaucoma; EBMD epithelial/anterior basement membrane dystrophy; ACIOL anterior chamber intraocular lens; IOL intraocular lens; PCIOL posterior chamber intraocular lens; Phaco/IOL phacoemulsification with intraocular lens placement; [place] photorefractive keratectomy; LASIK laser assisted in situ keratomileusis; HTN hypertension; DM diabetes mellitus; COPD chronic obstructive pulmonary disease

## 2019-11-22 ENCOUNTER — Other Ambulatory Visit: Payer: Self-pay | Admitting: Family Medicine

## 2019-11-22 DIAGNOSIS — E1142 Type 2 diabetes mellitus with diabetic polyneuropathy: Secondary | ICD-10-CM

## 2019-11-22 DIAGNOSIS — Z79899 Other long term (current) drug therapy: Secondary | ICD-10-CM

## 2019-11-22 DIAGNOSIS — E782 Mixed hyperlipidemia: Secondary | ICD-10-CM

## 2019-11-22 DIAGNOSIS — I10 Essential (primary) hypertension: Secondary | ICD-10-CM

## 2019-11-22 NOTE — Telephone Encounter (Signed)
May have 66 months old medicine Needs to do A1c, lipid, liver, metabolic 7, needs follow-up visit by September

## 2019-12-03 ENCOUNTER — Other Ambulatory Visit: Payer: Self-pay

## 2019-12-03 ENCOUNTER — Ambulatory Visit (HOSPITAL_COMMUNITY)
Admission: RE | Admit: 2019-12-03 | Discharge: 2019-12-03 | Disposition: A | Discharge status: Home / Self Care | Payer: Medicare Other | Source: Ambulatory Visit | Attending: Family Medicine | Admitting: Family Medicine

## 2019-12-03 DIAGNOSIS — M810 Age-related osteoporosis without current pathological fracture: Secondary | ICD-10-CM | POA: Diagnosis present

## 2019-12-04 ENCOUNTER — Encounter: Payer: Self-pay | Admitting: Family Medicine

## 2020-02-04 ENCOUNTER — Other Ambulatory Visit: Payer: Self-pay | Admitting: Family Medicine

## 2020-02-11 ENCOUNTER — Ambulatory Visit (HOSPITAL_COMMUNITY)
Admission: RE | Admit: 2020-02-11 | Discharge: 2020-02-11 | Disposition: A | Discharge status: Home / Self Care | Payer: Medicare Other | Source: Ambulatory Visit | Attending: Urology | Admitting: Urology

## 2020-02-11 ENCOUNTER — Other Ambulatory Visit: Payer: Self-pay

## 2020-02-11 DIAGNOSIS — N200 Calculus of kidney: Secondary | ICD-10-CM

## 2020-02-14 ENCOUNTER — Other Ambulatory Visit: Payer: Self-pay | Admitting: Family Medicine

## 2020-02-15 NOTE — Telephone Encounter (Signed)
Tried calling patient --no answer

## 2020-02-19 ENCOUNTER — Telehealth: Payer: Self-pay

## 2020-02-19 NOTE — Telephone Encounter (Signed)
Patient is scheduled for Nov 03-11-2020. Please refill meds.

## 2020-02-19 NOTE — Telephone Encounter (Signed)
[facility], also Dr Conaway Gourd that they will be able to see the patient super quick but that an options Obviously patient will want to utilize a eye doctor who is part of [employer] Until she gets approved by the eye doctor there is no way to approve her driving If she wants referral go ahead with referral If she would rather check with her insurance company then go from there she can

## 2020-02-19 NOTE — Telephone Encounter (Signed)
Patient went to renew her license and failed the vision test.  She has tried to call her eye doctor and he is on vacation for 4 weeks.  The number they gave her for the covering physician is disconnected.  Her other eye doctor in [place] has retired.  She wants to know if we can suggest anyone because she needs a retina specialist.  Michela Pitcher her license will expire soon.

## 2020-02-20 NOTE — Telephone Encounter (Signed)
Pt contacted. Pt states that she is going to a place close by that checks vision and makes glasses the same day. Pt has until her birthday to get her license renewed.

## 2020-02-25 ENCOUNTER — Other Ambulatory Visit: Payer: Self-pay | Admitting: Family Medicine

## 2020-03-11 ENCOUNTER — Telehealth: Payer: Self-pay | Admitting: *Deleted

## 2020-03-11 ENCOUNTER — Encounter: Payer: Self-pay | Admitting: Family Medicine

## 2020-03-11 ENCOUNTER — Ambulatory Visit (INDEPENDENT_AMBULATORY_CARE_PROVIDER_SITE_OTHER): Payer: Medicare Other | Admitting: Family Medicine

## 2020-03-11 VITALS — Temp 98.3°F | Ht 62.0 in | Wt 200.8 lb

## 2020-03-11 DIAGNOSIS — Z23 Encounter for immunization: Secondary | ICD-10-CM

## 2020-03-11 DIAGNOSIS — I10 Essential (primary) hypertension: Secondary | ICD-10-CM

## 2020-03-11 DIAGNOSIS — E1142 Type 2 diabetes mellitus with diabetic polyneuropathy: Secondary | ICD-10-CM | POA: Diagnosis not present

## 2020-03-11 DIAGNOSIS — I700 Atherosclerosis of aorta: Secondary | ICD-10-CM

## 2020-03-11 MED ORDER — CITALOPRAM HYDROBROMIDE 20 MG PO TABS
20.0000 mg | ORAL_TABLET | Freq: Every day | ORAL | 1 refills | Status: DC
Start: 2020-03-11 — End: 2020-04-28

## 2020-03-11 MED ORDER — PRAVASTATIN SODIUM 10 MG PO TABS
10.0000 mg | ORAL_TABLET | Freq: Every day | ORAL | 1 refills | Status: DC
Start: 2020-03-11 — End: 2020-04-28

## 2020-03-11 MED ORDER — AMLODIPINE BESYLATE 2.5 MG PO TABS
2.5000 mg | ORAL_TABLET | Freq: Every day | ORAL | 1 refills | Status: DC
Start: 2020-03-11 — End: 2020-04-28

## 2020-03-11 MED ORDER — LOSARTAN POTASSIUM 50 MG PO TABS
50.0000 mg | ORAL_TABLET | Freq: Every day | ORAL | 1 refills | Status: DC
Start: 2020-03-11 — End: 2020-04-28

## 2020-03-11 MED ORDER — METFORMIN HCL 1000 MG PO TABS
ORAL_TABLET | ORAL | 1 refills | Status: DC
Start: 2020-03-11 — End: 2020-04-28

## 2020-03-11 MED ORDER — PANTOPRAZOLE SODIUM 40 MG PO TBEC
DELAYED_RELEASE_TABLET | ORAL | 1 refills | Status: DC
Start: 2020-03-11 — End: 2020-04-28

## 2020-03-11 NOTE — Telephone Encounter (Signed)
Pt states she never got results of her renal US that kidney dr ordered.

## 2020-03-11 NOTE — Progress Notes (Signed)
   Subjective:    Patient ID: Shelly Jackson, female    DOB: 03-Mar-1935, 85 y.o.   MRN: [id number]  Diabetes She presents for her follow-up diabetic visit. She has type 2 diabetes mellitus. Pertinent negatives for hypoglycemia include no confusion or dizziness. Pertinent negatives for diabetes include no chest pain, no fatigue, no polydipsia, no polyphagia and no weakness. Risk factors for coronary artery disease include diabetes mellitus, dyslipidemia, hypertension, post-menopausal and sedentary lifestyle. Current diabetic treatment includes insulin injections and oral agent (monotherapy). She is compliant with treatment all of the time. Her weight is stable. She is following a diabetic diet.      Review of Systems  Constitutional: Negative for activity change, appetite change and fatigue.  HENT: Negative for congestion and rhinorrhea.   Respiratory: Negative for cough and shortness of breath.   Cardiovascular: Negative for chest pain and leg swelling.  Gastrointestinal: Negative for abdominal pain and diarrhea.  Endocrine: Negative for polydipsia and polyphagia.  Skin: Negative for color change.  Neurological: Negative for dizziness and weakness.  Psychiatric/Behavioral: Negative for behavioral problems and confusion.       Objective:   Physical Exam Vitals reviewed.  Constitutional:      General: She is not in acute distress. HENT:     Head: Normocephalic and atraumatic.  Eyes:     General:        Right eye: No discharge.        Left eye: No discharge.  Neck:     Trachea: No tracheal deviation.  Cardiovascular:     Rate and Rhythm: Normal rate and regular rhythm.     Heart sounds: Normal heart sounds. No murmur heard.   Pulmonary:     Effort: Pulmonary effort is normal. No respiratory distress.     Breath sounds: Normal breath sounds.  Lymphadenopathy:     Cervical: No cervical adenopathy.  Skin:    General: Skin is warm and dry.  Neurological:     Mental Status:  She is alert.     Coordination: Coordination normal.  Psychiatric:        Behavior: Behavior normal.      Diabetic foot exam with bunions and neuropathy at risk of infections and ulcers     Assessment & Plan:  1. Need for vaccination today - Flu Vaccine QUAD High Dose(Fluad)  2. HTN (hypertension), benign HTN- patient seen for follow-up regarding HTN.  Diet, medication compliance, appropriate labs and refills were completed.  Importance of keeping blood pressure under good control to lessen the risk of complications discussed   3. Aortic atherosclerosis (HCC) Seen on ct keep chol under control use statin   4. Type 2 diabetes mellitus with diabetic polyneuropathy, unspecified whether long term insulin use (HCC) Pt will do labs follow up in 4 months will notify her of results  Will [facility]

## 2020-03-11 NOTE — Telephone Encounter (Signed)
Lmtc

## 2020-03-11 NOTE — Telephone Encounter (Signed)
The urologist ordered an ultrasound.  This ultrasound showed some cysts on both kidneys which none of them are at a concerning level.  No sign of kidney stones.  If ongoing troubles to follow-up.  No additional testing recommended at this time.

## 2020-03-12 LAB — BASIC METABOLIC PANEL
BUN/Creatinine Ratio: 29 — ABNORMAL HIGH (ref 12–28)
BUN: 20 mg/dL (ref 8–27)
CO2: 28 mmol/L (ref 20–29)
Calcium: 8.8 mg/dL (ref 8.7–10.3)
Chloride: 106 mmol/L (ref 96–106)
Creatinine, Ser: 0.7 mg/dL (ref 0.57–1.00)
GFR calc Af Amer: 93 mL/min/{1.73_m2} (ref >59)
GFR calc non Af Amer: 80 mL/min/{1.73_m2} (ref >59)
Glucose: 58 mg/dL — ABNORMAL LOW (ref 65–99)
Potassium: 3.4 mmol/L — ABNORMAL LOW (ref 3.5–5.2)
Sodium: 146 mmol/L — ABNORMAL HIGH (ref 134–144)

## 2020-03-12 LAB — HEMOGLOBIN A1C
Est. average glucose Bld gHb Est-mCnc: 140 mg/dL
Hgb A1c MFr Bld: 6.5 % — ABNORMAL HIGH (ref 4.8–5.6)

## 2020-03-12 LAB — LIPID PANEL
Chol/HDL Ratio: 3.1 ratio (ref 0.0–4.4)
Cholesterol, Total: 163 mg/dL (ref 100–199)
HDL: 53 mg/dL (ref >39)
LDL Chol Calc (NIH): 91 mg/dL (ref 0–99)
Triglycerides: 105 mg/dL (ref 0–149)
VLDL Cholesterol Cal: 19 mg/dL (ref 5–40)

## 2020-03-12 LAB — HEPATIC FUNCTION PANEL
ALT: 14 IU/L (ref 0–32)
AST: 13 IU/L (ref 0–40)
Albumin: 4.1 g/dL (ref 3.6–4.6)
Alkaline Phosphatase: 77 IU/L (ref 44–121)
Bilirubin Total: 0.6 mg/dL (ref 0.0–1.2)
Bilirubin, Direct: 0.18 mg/dL (ref 0.00–0.40)
Total Protein: 6.3 g/dL (ref 6.0–8.5)

## 2020-03-12 NOTE — Progress Notes (Signed)
Script written out and form filled out for diabetic shoes through [employer]. Will fax once signed.

## 2020-03-13 ENCOUNTER — Other Ambulatory Visit: Payer: Self-pay | Admitting: *Deleted

## 2020-03-13 DIAGNOSIS — I10 Essential (primary) hypertension: Secondary | ICD-10-CM

## 2020-03-13 MED ORDER — POTASSIUM CHLORIDE ER 10 MEQ PO TBCR: EXTENDED_RELEASE_TABLET | ORAL | 3 refills | Status: DC

## 2020-03-13 NOTE — Telephone Encounter (Signed)
Patient notified

## 2020-03-17 ENCOUNTER — Telehealth: Payer: Self-pay

## 2020-03-17 NOTE — Telephone Encounter (Signed)
Patient was seen 11/2 and states something was suppose to be called in for arthritis to [facility]

## 2020-03-18 ENCOUNTER — Other Ambulatory Visit: Payer: Self-pay | Admitting: Family Medicine

## 2020-03-18 MED ORDER — TRAMADOL HCL 50 MG PO TABS: 50.0000 mg | ORAL_TABLET | Freq: Three times a day (TID) | ORAL | 0 refills | Status: AC | PRN

## 2020-03-18 NOTE — Telephone Encounter (Signed)
Lmtc

## 2020-03-18 NOTE — Telephone Encounter (Signed)
This is a controlled medication I only recommend in the evening hours because it can cause some drowsiness by law I can only send in a few at a time thank you sent to her local pharmacy thank you

## 2020-03-18 NOTE — Telephone Encounter (Signed)
Nurses We did send to the [facility] for her shoes Given her underlying diabetes, being on 81 mg aspirin, retinal issues I would not recommend an anti-inflammatory for arthritis I would recommend plain Tylenol If plain Tylenol is not doing enough I can prescribe her a small amount of tramadol that she could use when pain is severe but only use it when she is at home Please talk with patient

## 2020-03-18 NOTE — Telephone Encounter (Signed)
Pt contacted and verbalized understanding. Pt states that she would like Tramadol sent in because sometimes the pain is so bad she can not sleep. Please advise. Thank you

## 2020-03-19 NOTE — Telephone Encounter (Signed)
Discussed with pt. Pt verbalized understanding.  °

## 2020-04-14 IMAGING — DX DG SHOULDER 2+V*R*
3 series · 3 of 3 positions shown · non-contrast
Comparison: None.

CLINICAL DATA: Fall in [REDACTED] with right shoulder pain

EXAM:
RIGHT SHOULDER - 2+ VIEW

[shoulder grashey]
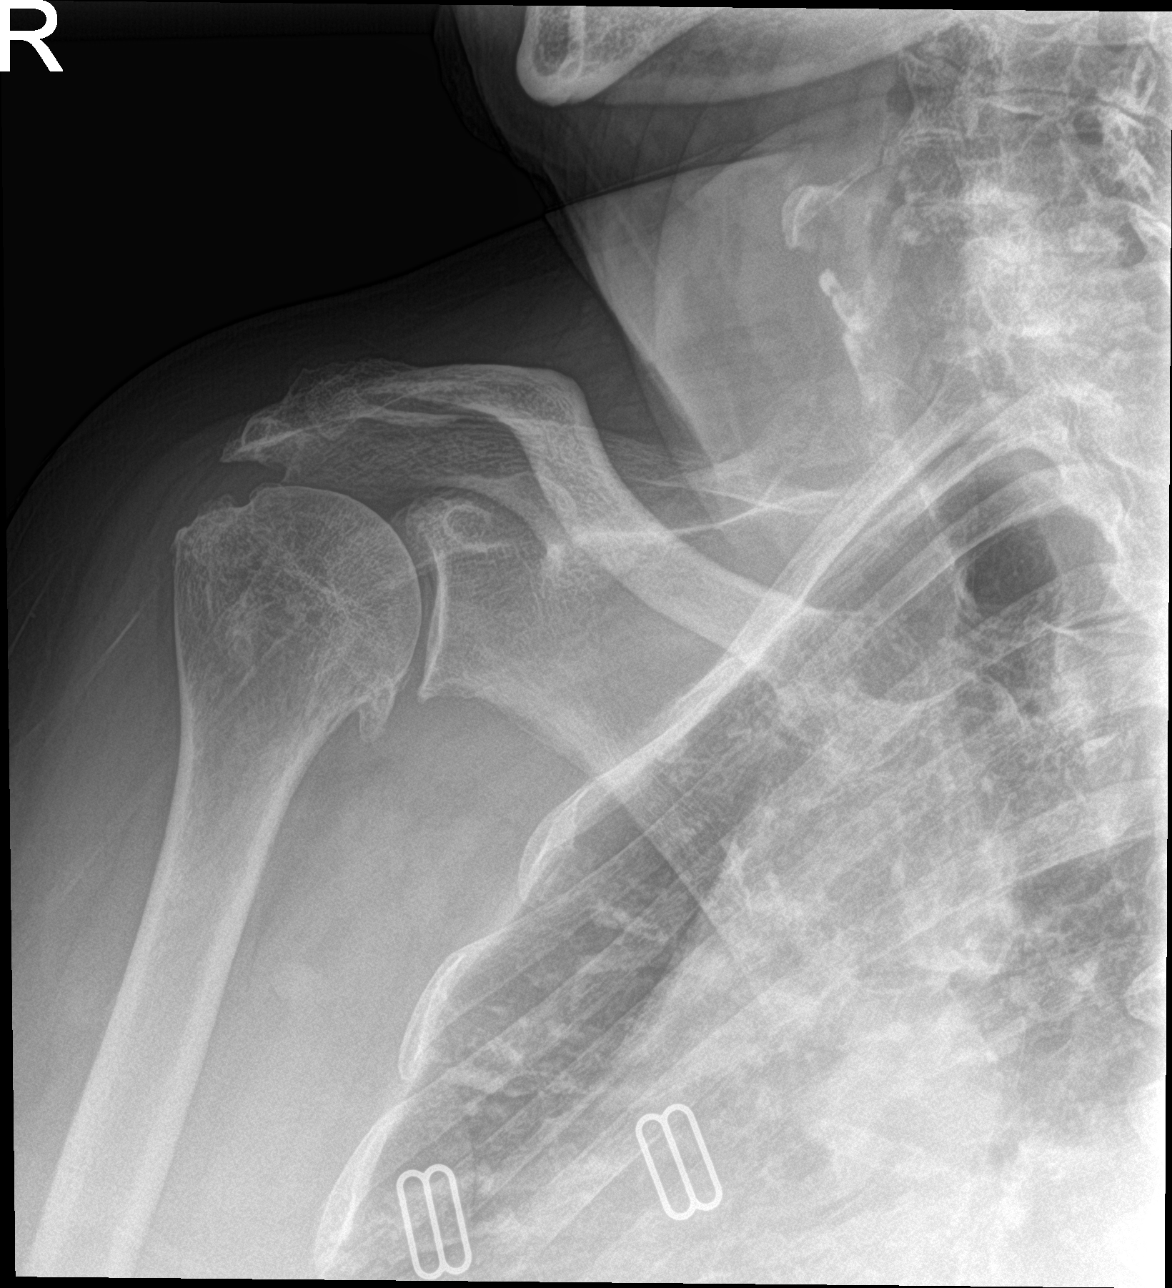

[shoulder y view]
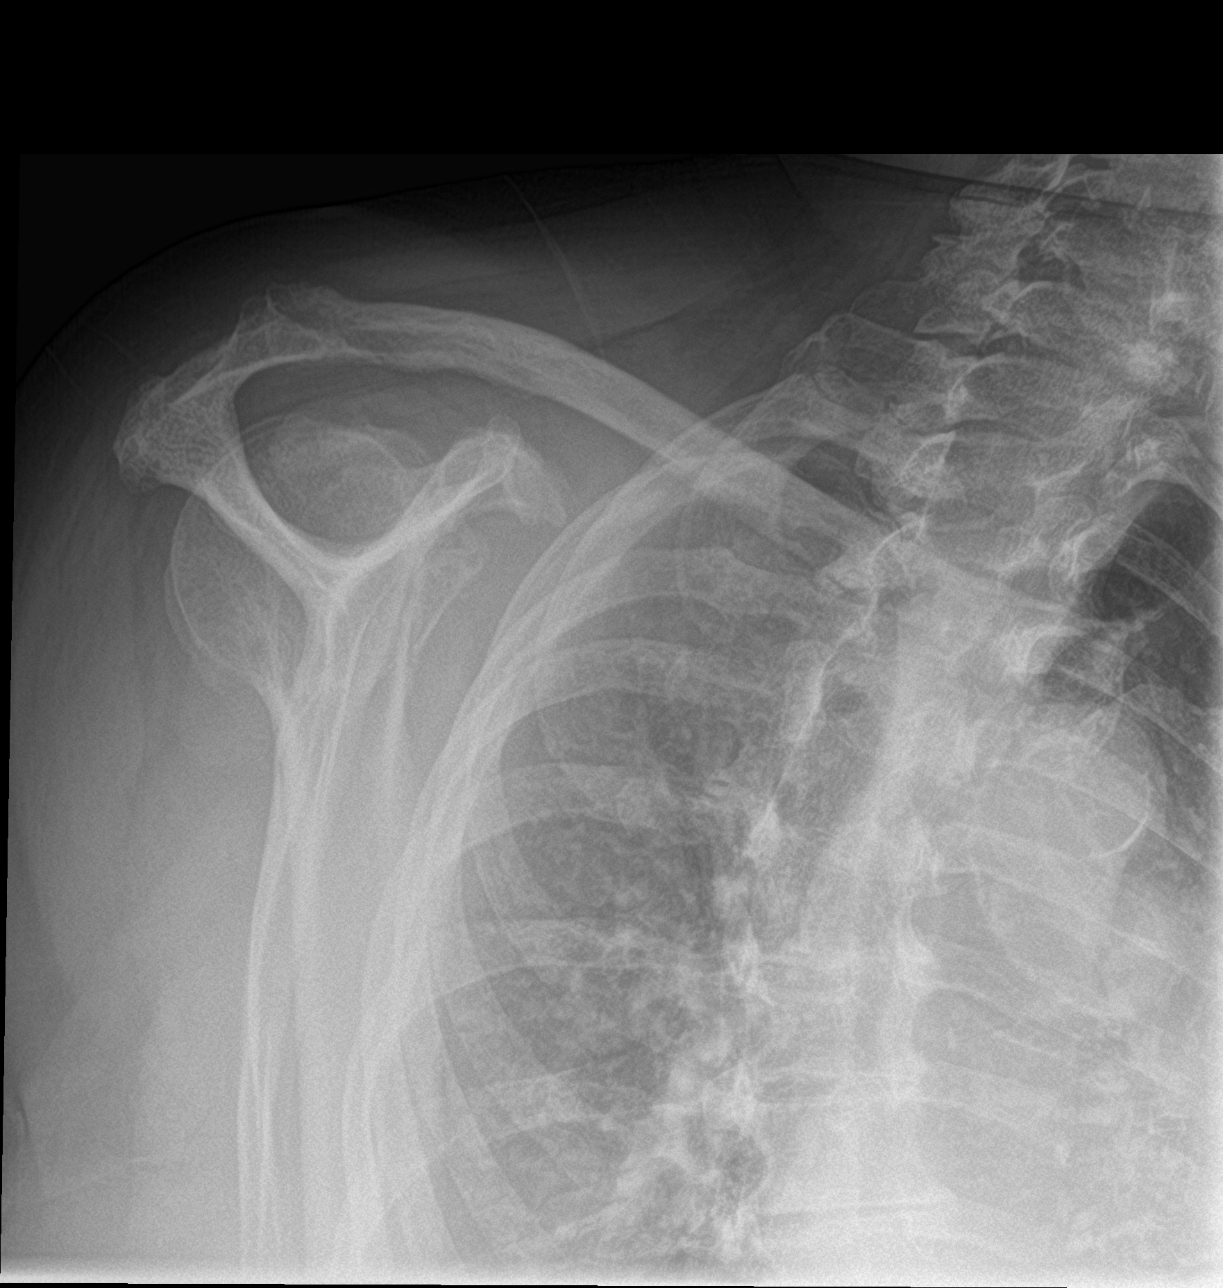

[shoulder axillary]
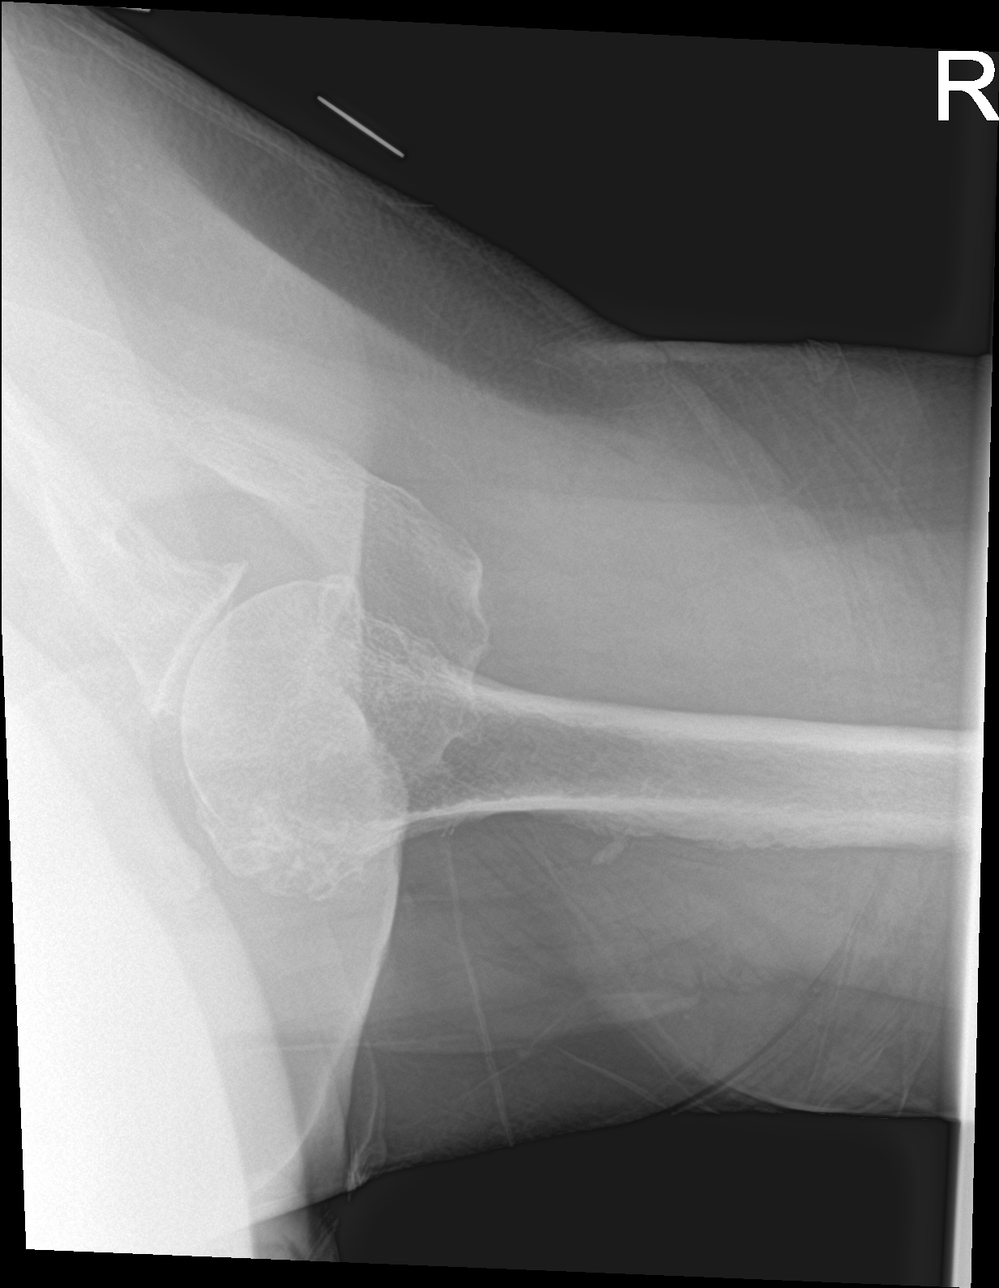

[3 of 3 positions shown; findings below may reference images not displayed]

FINDINGS: Moderate AC joint degenerative change. No acute displaced fracture
or dislocation. Prominent inferior spurring at the glenohumeral
interval. Probable small amount of calcific tendinitis.
IMPRESSION: 1. No acute osseous abnormality.
2. Degenerative changes of the right shoulder with probable small
amount of calcific tendinitis

## 2020-04-18 ENCOUNTER — Telehealth: Payer: Self-pay

## 2020-04-22 ENCOUNTER — Other Ambulatory Visit: Payer: Self-pay

## 2020-04-22 MED ORDER — LANTUS SOLOSTAR 100 UNIT/ML ~~LOC~~ SOPN
PEN_INJECTOR | SUBCUTANEOUS | 3 refills | Status: DC
Start: 2020-04-22 — End: 2021-03-23

## 2020-04-22 NOTE — Telephone Encounter (Signed)
   JD            [name] Female, 85 y.o., 04/03/1935  MRN:  [id number] Phone:  [phone] (H)       PCP:  Kathyrn Drown, MD Primary [facility] [employer] Medicare Choice Ppo  Next Appt With [facility] Specialists 05/22/2020 at 1:45 PM             To Close This Visit  Required Items  No additional encounter notes found.      Advice Only (Patient is requesting refill on her lantus to be called into [facility]. She is requesting renewnal on prescription also. She wanting nurse to call when order.)  Rheta, [place] [phone]  You 4 days ago       Questionnaires  No completed forms available for this encounter.

## 2020-04-22 NOTE — Telephone Encounter (Signed)
Lantus sent to pharmacy. Left message to return call

## 2020-04-27 ENCOUNTER — Other Ambulatory Visit: Payer: Self-pay | Admitting: Family Medicine

## 2020-05-22 ENCOUNTER — Ambulatory Visit (INDEPENDENT_AMBULATORY_CARE_PROVIDER_SITE_OTHER): Payer: Medicare Other | Admitting: Ophthalmology

## 2020-05-22 ENCOUNTER — Other Ambulatory Visit: Payer: Self-pay

## 2020-05-22 ENCOUNTER — Encounter (INDEPENDENT_AMBULATORY_CARE_PROVIDER_SITE_OTHER): Payer: Self-pay | Admitting: Ophthalmology

## 2020-05-22 DIAGNOSIS — E113511 Type 2 diabetes mellitus with proliferative diabetic retinopathy with macular edema, right eye: Secondary | ICD-10-CM

## 2020-05-22 DIAGNOSIS — E113512 Type 2 diabetes mellitus with proliferative diabetic retinopathy with macular edema, left eye: Secondary | ICD-10-CM

## 2020-05-22 MED ORDER — BEVACIZUMAB 2.5 MG/0.1ML IZ SOSY
2.5000 mg | PREFILLED_SYRINGE | INTRAVITREAL | Status: AC | PRN
Start: 2020-05-22 — End: 2020-05-22
  Administered 2020-05-22: 2.5 mg via INTRAVITREAL

## 2020-05-22 NOTE — Assessment & Plan Note (Addendum)
The nature of diabetic macular edema was discussed with the patient. Treatment options were outlined including medical therapy, laser & vitrectomy. The use of injectable medications reviewed, including Avastin, Lucentis, and Eylea. Periodic injections into the eye are likely to resolve diabetic macular edema (swelling in the center of vision). Initially, injections are delivered are delivered every 4-6 weeks, and the interval extended as the condition improves. On average, 8-9 injections the first year, and 5 in year 2. Improvement in the condition most often improves on medical therapy. Occasional use of focal laser is also recommended for residual macular edema (swelling). Excellent control of blood glucose and blood pressure are encouraged under the care of a primary physician or endocrinologist. Similarly, attempts to maintain serum cholesterol, low density lipoproteins, and high-density lipoproteins in a favorable range were recommended.  

## 2020-05-22 NOTE — Assessment & Plan Note (Signed)
OS with less acuity prognosis, will refer reschedule in the coming weeks

## 2020-05-22 NOTE — Progress Notes (Signed)
05/22/2020     CHIEF COMPLAINT Patient presents for Retina Follow Up (6 Month Pdr f\u. OCT and FP/Pt states she recently had to get gls to drive. Denies any complaints, floaters or FOL./BGL: 141 yesterday/A1C: 6.5)   HISTORY OF PRESENT ILLNESS: Shelly Jackson is a 86 y.o. female who presents to the clinic today for:   HPI    Retina Follow Up    Patient presents with  Diabetic Retinopathy.  In left eye.  Severity is moderate.  Duration of 6 months.  Since onset it is stable.  I, the attending physician,  performed the HPI with the patient and updated documentation appropriately. Additional comments: 6 Month Pdr f\u. OCT and FP Pt states she recently had to get gls to drive. Denies any complaints, floaters or FOL. BGL: 141 yesterday A1C: 6.5       Last edited by Tilda Franco on 05/22/2020  2:00 PM. (History)      Referring physician: Kathyrn Drown, MD [address] [place],  [place] [address]  HISTORICAL INFORMATION:   Selected notes from the MEDICAL RECORD NUMBER    Lab Results  Component Value Date   HGBA1C 6.5 (H) 03/11/2020     CURRENT MEDICATIONS: No current outpatient medications on file. (Ophthalmic Drugs)   No current facility-administered medications for this visit. (Ophthalmic Drugs)   Current Outpatient Medications (Other)  Medication Sig  . Accu-Chek Softclix Lancets lancets Use as instructed  . amLODipine (NORVASC) 2.5 MG tablet TAKE 1 TABLET BY MOUTH  DAILY  . aspirin 81 MG tablet Take 81 mg by mouth daily.  . blood glucose meter kit and supplies Dispense based on patient and insurance preference. Use to test blood sugar 3 times a day (FOR ICD-10 E10.9, E11.9).  . citalopram (CELEXA) 20 MG tablet TAKE 1 TABLET BY MOUTH  DAILY  . glucose blood (ONE TOUCH ULTRA TEST) test strip TEST BLOOD SUGAR THREE TIMES DAILY  . glucose blood test strip Use as instructed  . insulin glargine (LANTUS SOLOSTAR) 100 UNIT/ML Solostar Pen INJECT SUBCUTANEOUSLY  48  UNITS AT BEDTIME  . Insulin Pen Needle (B-D UF III MINI PEN NEEDLES) 31G X 5 MM MISC USE AS DIRECTED  . Insulin Syringes, Disposable, U-100 1 ML MISC Use to administer insulin  . losartan (COZAAR) 50 MG tablet TAKE 1 TABLET BY MOUTH  DAILY  . metFORMIN (GLUCOPHAGE) 1000 MG tablet TAKE 1 TABLET BY MOUTH IN  THE MORNING AND ONE-HALF  TABLET BY MOUTH AT NIGHT  . pantoprazole (PROTONIX) 40 MG tablet TAKE 1 TABLET BY MOUTH  DAILY  . potassium chloride (KLOR-CON) 10 MEQ tablet Take 1 tablet on Mondays, wednesdays and fridays.  . pravastatin (PRAVACHOL) 10 MG tablet TAKE 1 TABLET BY MOUTH  DAILY  . triamcinolone cream (KENALOG) 0.1 % Apply thin amount twice a day as needed to help with itching around the umbilicus   No current facility-administered medications for this visit. (Other)      REVIEW OF SYSTEMS:    ALLERGIES Allergies  Allergen Reactions  . Daypro [Oxaprozin]     Indigestion     PAST MEDICAL HISTORY Past Medical History:  Diagnosis Date  . Acid reflux   . Anemia    Secondary to acute blood loss  . Anxiety   . Arthritis    RA  . CHF (congestive heart failure) ([place])   . Diabetes mellitus   . Diabetic retinopathy ([place])   . Diverticulitis 07/24/11   Inpatient  [facility]  . History of kidney stones   . Hypertension   . Obesity   . Rheumatoid arthritis(714.0)   . Sleep apnea   . Thrombocytopenia ([place])   . Tubular adenoma of colon 07/24/11   Past Surgical History:  Procedure Laterality Date  . APPENDECTOMY    . CATARACT EXTRACTION Bilateral (right 08/2014) ( left 2005)  . CHOLECYSTECTOMY    . COLONOSCOPY  07/24/2011   Dr. Deatra Ina MM polyp at proximal ascending colon could not be removed because of difficult approach, pancolonic diverticulosis with diverticulitis involving the ascending colon, and suspected diverticular bleed, 7 mm polyp snared from proximal sigmoid colon with hemorrhagic surface found to be a tubular adenoma, small external hemorrhoids  . COLONOSCOPY   08/2007   Dr. Malva Limes hyperplastic polyp  . COLONOSCOPY N/A 12/14/2012   Procedure: COLONOSCOPY;  Surgeon: Daneil Dolin, MD;  Location: [facility];  Service: Endoscopy;  Laterality: N/A;  8:30  . CYSTOSCOPY/RETROGRADE/URETEROSCOPY Right 06/27/2017   Procedure: CYSTOSCOPY/RIGHT RETROGRADE PYELOGRAM/RIGHT URETEROSCOPY;  Surgeon: Cleon Gustin, MD;  Location: [facility];  Service: Urology;  Laterality: Right;  . HOLMIUM LASER APPLICATION Right 2/84/1324   Procedure: HOLMIUM LASER LITHOTRIPSY RIGHT RENAL CALCULUS;  Surgeon: Cleon Gustin, MD;  Location: [facility];  Service: Urology;  Laterality: Right;  . TONSILLECTOMY      FAMILY HISTORY Family History  Problem Relation Age of Onset  . Coronary artery disease Father   . Heart attack Father   . Cirrhosis Mother   . Hypertension Mother   . Colon cancer Neg Hx     SOCIAL HISTORY Social History   Tobacco Use  . Smoking status: Never Smoker  . Smokeless tobacco: Never Used  . Tobacco comment: Never  Vaping Use  . Vaping Use: Never used  Substance Use Topics  . Alcohol use: No  . Drug use: No         OPHTHALMIC EXAM: Base Eye Exam    Visual Acuity (Snellen - Linear)      Right Left   Dist [place] 20/60 -2 20/400   Dist ph [place] 20/40 20/80 -2       Tonometry (Tonopen, 2:00 PM)      Right Left   Pressure 17 17       Pupils      Pupils Dark Light Shape React APD   Right PERRL 4 3 Round Brisk None   Left PERRL 3 3 Round Minimal None       Visual Fields (Counting fingers)      Left Right    Full Full       Neuro/Psych    Oriented x3: Yes   Mood/Affect: Normal       Dilation    Both eyes: 1.0% Mydriacyl, 2.5% Phenylephrine @ 2:00 PM        Slit Lamp and Fundus Exam    External Exam      Right Left   External Normal Normal       Slit Lamp Exam      Right Left   Lids/Lashes Normal Normal   Conjunctiva/Sclera White and quiet White and quiet   Cornea Clear Clear   Anterior Chamber Deep and quiet  Deep and quiet   Iris Round and reactive Round and reactive   Lens Posterior chamber intraocular lens Posterior chamber intraocular lens   Anterior Vitreous Normal Normal       Fundus Exam      Right Left   Posterior Vitreous  Posterior vitreous detachment Posterior vitreous detachment   Disc Normal Normal   C/D Ratio 0.25 0.3   Macula Microaneurysms, Mild clinically significant macular edema Microaneurysms, Geographic atrophy   Vessels PDR-quiet PDR-quiet   Periphery PRP,  attached, PRP,  attached,          IMAGING AND PROCEDURES  Imaging and Procedures for 05/22/20  OCT, Retina - OU - Both Eyes       Right Eye Quality was good. Scan locations included subfoveal. Central Foveal Thickness: 390. Progression has worsened. Findings include abnormal foveal contour, cystoid macular edema.   Left Eye Quality was good. Central Foveal Thickness: 385. Progression has worsened. Findings include abnormal foveal contour, cystoid macular edema.   Notes Diabetic CSME OD now recurrent, OS as well.  Will need to commence with injection intravitreal Avastin OD today       Color Fundus Photography Optos - OU - Both Eyes       Right Eye Progression has been stable. Disc findings include normal observations. Macula : edema.   Left Eye Progression has been stable. Disc findings include normal observations. Macula : edema.   Notes Bilateral proliferative diabetic retinopathy  , Quiescent overall, room for PRP inferotemporal OD.  Diffuse macular edema noted       Intravitreal Injection, Pharmacologic Agent - OD - Right Eye       Time Out 05/22/2020. 2:27 PM. Confirmed correct patient, procedure, site, and patient consented.   Anesthesia Topical anesthesia was used. Anesthetic medications included [place] 3.5%.   Procedure Preparation included Ofloxacin . A 30 gauge needle was used.   Injection:  2.5 mg Bevacizumab (AVASTIN) 2.59m/0.1mL SOSY   NDC: 749675-916-38 Lot:: [id number]   Route: Intravitreal, Site: Right Eye  Post-op Post injection exam found visual acuity of at least counting fingers. The patient tolerated the procedure well. There were no complications. The patient received written and verbal post procedure care education. Post injection medications were not given.                 ASSESSMENT/PLAN:  Diabetic macular edema of right eye with proliferative retinopathy associated with type 2 diabetes mellitus ([place]  The nature of diabetic macular edema was discussed with the patient. Treatment options were outlined including medical therapy, laser & vitrectomy. The use of injectable medications reviewed, including Avastin, Lucentis, and Eylea. Periodic injections into the eye are likely to resolve diabetic macular edema (swelling in the center of vision). Initially, injections are delivered are delivered every 4-6 weeks, and the interval extended as the condition improves. On average, 8-9 injections the first year, and 5 in year 2. Improvement in the condition most often improves on medical therapy. Occasional use of focal laser is also recommended for residual macular edema (swelling). Excellent control of blood glucose and blood pressure are encouraged under the care of a primary physician or endocrinologist. Similarly, attempts to maintain serum cholesterol, low density lipoproteins, and high-density lipoproteins in a favorable range were recommended.     Proliferative diabetic retinopathy of left eye with macular edema associated with type 2 diabetes mellitus ([place] OS with less acuity prognosis, will refer reschedule in the coming weeks      ICD-10-CM   1. Diabetic macular edema of right eye with proliferative retinopathy associated with type 2 diabetes mellitus (HCC)  ET70.1779Intravitreal Injection, Pharmacologic Agent - OD - Right Eye    bevacizumab (AVASTIN) SOSY 2.5 mg  2. Proliferative diabetic retinopathy of left eye with macular edema associated  with type 2 diabetes mellitus (HCC)  I68.0321 OCT, Retina - OU - Both Eyes    Color Fundus Photography Optos - OU - Both Eyes    1.  Bilateral CSME now recurrent.  We will commence with intravitreal Avastin OD today to resolve the attempt to recover best visual acuity, and eye with best prognosis  2.  OS also with CSME.  Patient declined offered to treat both eyes today.  We will treat left eye in the near future  3.  Ophthalmic Meds Ordered this visit:  Meds ordered this encounter  Medications  . bevacizumab (AVASTIN) SOSY 2.5 mg       Return in about 2 weeks (around 06/05/2020) for dilate, OS, AVASTIN OCT.  There are no Patient Instructions on file for this visit.   Explained the diagnoses, plan, and follow up with the patient and they expressed understanding.  Patient expressed understanding of the importance of proper follow up care.   Clent Demark Brogen Duell M.D. Diseases & Surgery of the Retina and Vitreous [facility] Di[facility] [place] 05/22/20     Abbreviations: M myopia (nearsighted); A astigmatism; H hyperopia (farsighted); P presbyopia; Mrx spectacle prescription;  CTL contact lenses; OD right eye; OS left eye; OU both eyes  XT exotropia; ET esotropia; PEK punctate epithelial keratitis; PEE punctate epithelial erosions; DES dry eye syndrome; MGD meibomian gland dysfunction; ATs artificial tears; PFAT's preservative free artificial tears; [place] nuclear sclerotic cataract; PSC posterior subcapsular cataract; ERM epi-retinal membrane; PVD posterior vitreous detachment; RD retinal detachment; DM diabetes mellitus; DR diabetic retinopathy; NPDR non-proliferative diabetic retinopathy; PDR proliferative diabetic retinopathy; CSME clinically significant macular edema; DME diabetic macular edema; dbh dot blot hemorrhages; CWS cotton wool spot; POAG primary open angle glaucoma; C/D cup-to-disc ratio; HVF humphrey visual field; GVF goldmann visual field; OCT optical coherence tomography; IOP  intraocular pressure; BRVO Branch retinal vein occlusion; CRVO central retinal vein occlusion; CRAO central retinal artery occlusion; BRAO branch retinal artery occlusion; RT retinal tear; SB scleral buckle; PPV pars plana vitrectomy; VH Vitreous hemorrhage; PRP panretinal laser photocoagulation; IVK intravitreal kenalog; VMT vitreomacular traction; MH Macular hole;  NVD neovascularization of the disc; NVE neovascularization elsewhere; AREDS age related eye disease study; ARMD age related macular degeneration; POAG primary open angle glaucoma; EBMD epithelial/anterior basement membrane dystrophy; ACIOL anterior chamber intraocular lens; IOL intraocular lens; PCIOL posterior chamber intraocular lens; Phaco/IOL phacoemulsification with intraocular lens placement; [place] photorefractive keratectomy; LASIK laser assisted in situ keratomileusis; HTN hypertension; DM diabetes mellitus; COPD chronic obstructive pulmonary disease

## 2020-05-27 IMAGING — DX DG CERVICAL SPINE COMPLETE 4+V
6 series · 6 of 6 positions shown · non-contrast
Comparison: None.

CLINICAL DATA: Cervical pain.

EXAM:
CERVICAL SPINE - COMPLETE 4+ VIEW

[c-spine lat]
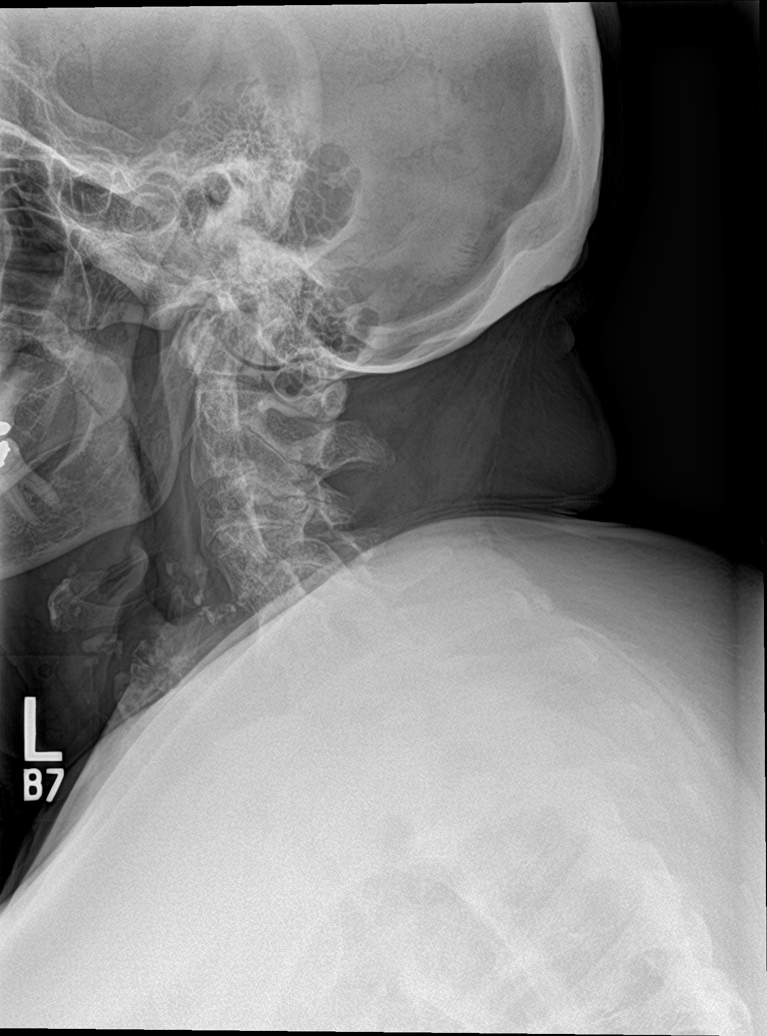

[c-spine obl (1 of 2)]
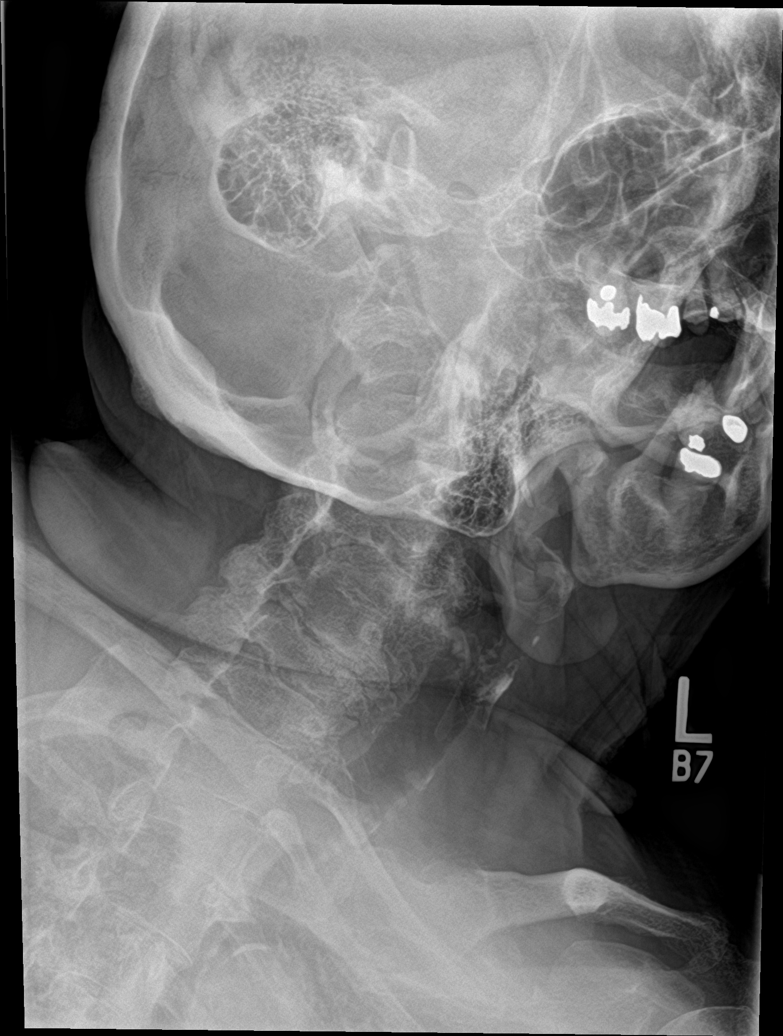

[c-spine obl (2 of 2)]
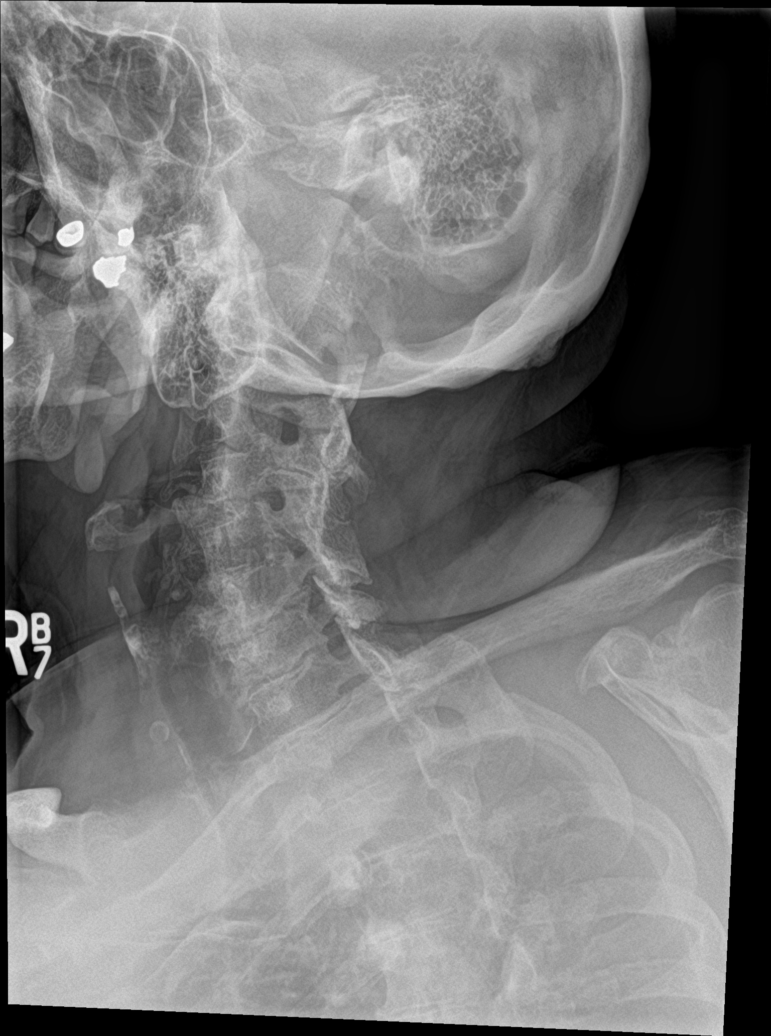

[c-spine ap]
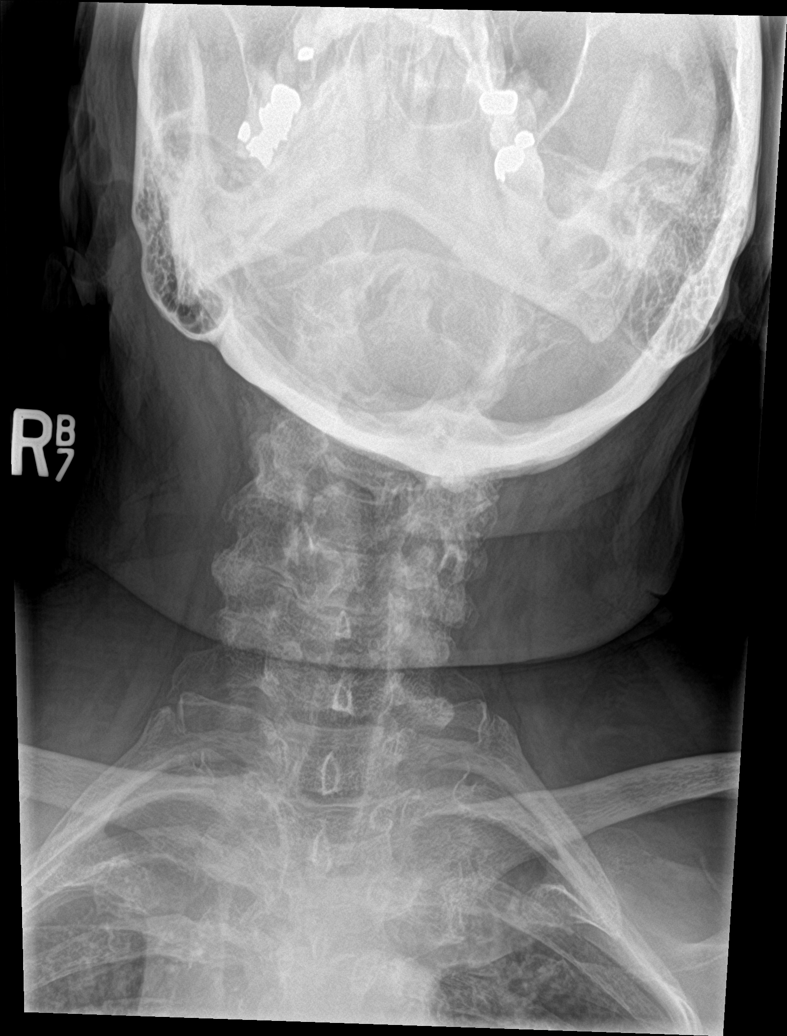

[c-spine open mouth]
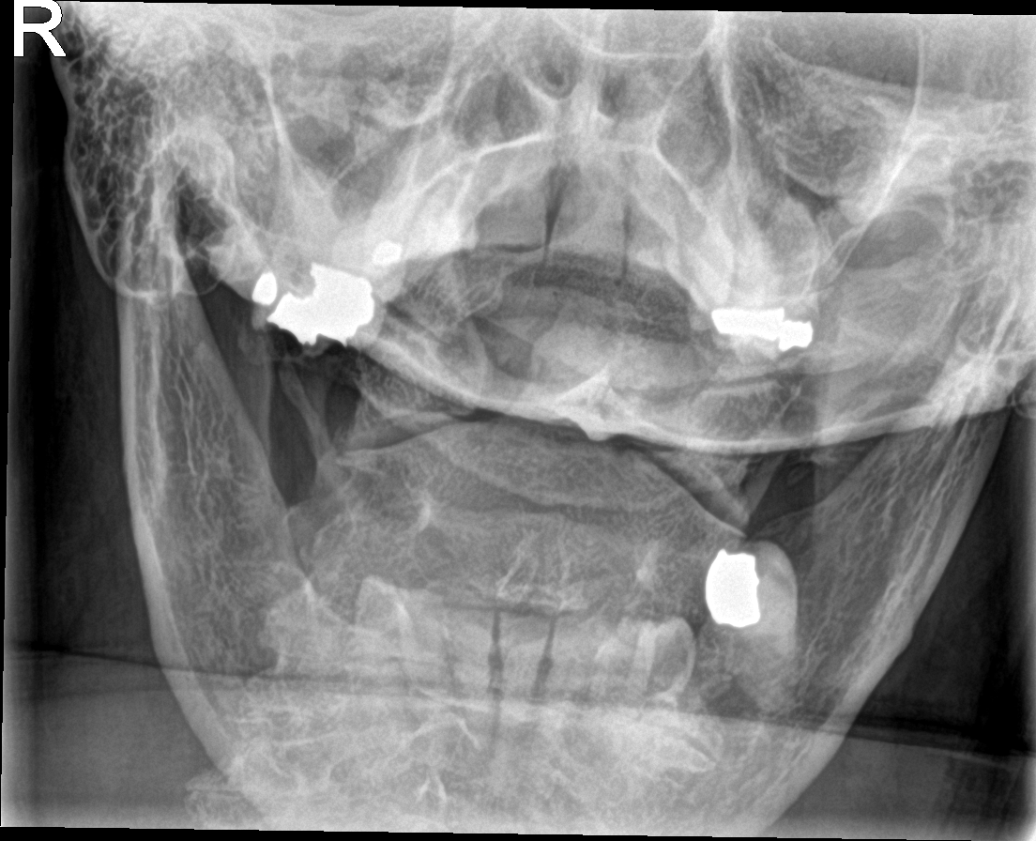

[c-spine swimmers trauma]
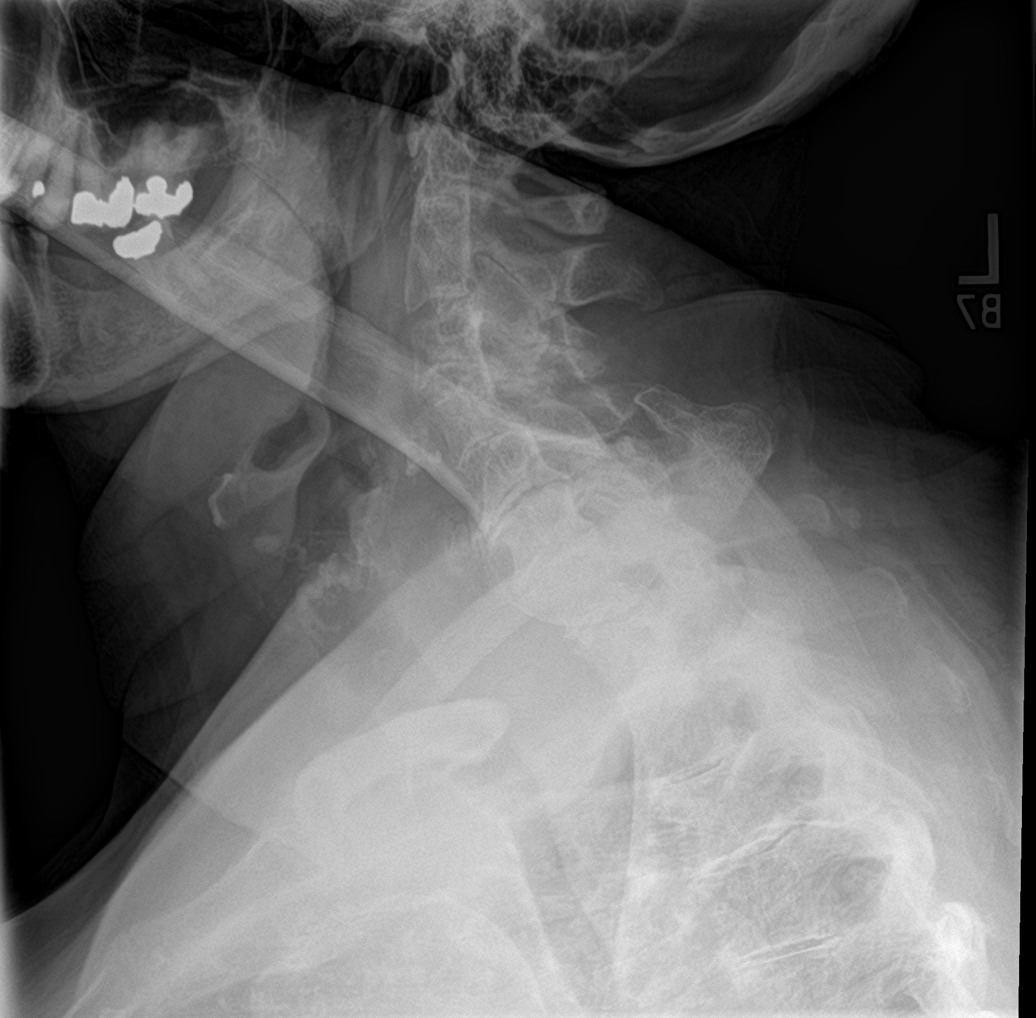

[6 of 6 positions shown; findings below may reference images not displayed]

FINDINGS: There appears to be chronic fusion of the C2 and C3 vertebral bodies
which is possibly congenital. Mild anterolisthesis at C5-C6. There
is severe facet arthropathy in the cervical spine, left side greater
than right. Limited evaluation of the prevertebral soft tissues.
Significant disc space narrowing at C5-C6 and C6-C7. Alignment at
the cervicothoracic junction is within normal limits. Bony
encroachment of the neural foramen bilaterally. No evidence for
fracture.
IMPRESSION: 1. Significant multilevel degenerative disease in the cervical
spine.
2. Chronic or congenital fusion of the C2 and C3 vertebral bodies.
3. Limited evaluation of the prevertebral soft tissues.

## 2020-06-05 ENCOUNTER — Other Ambulatory Visit: Payer: Self-pay

## 2020-06-05 ENCOUNTER — Ambulatory Visit (INDEPENDENT_AMBULATORY_CARE_PROVIDER_SITE_OTHER): Payer: Medicare Other | Admitting: Ophthalmology

## 2020-06-05 ENCOUNTER — Encounter (INDEPENDENT_AMBULATORY_CARE_PROVIDER_SITE_OTHER): Payer: Self-pay | Admitting: Ophthalmology

## 2020-06-05 DIAGNOSIS — E113511 Type 2 diabetes mellitus with proliferative diabetic retinopathy with macular edema, right eye: Secondary | ICD-10-CM

## 2020-06-05 DIAGNOSIS — E113512 Type 2 diabetes mellitus with proliferative diabetic retinopathy with macular edema, left eye: Secondary | ICD-10-CM

## 2020-06-05 NOTE — Assessment & Plan Note (Signed)
2 weeks post injection Avastin OD improved overall repeat examination as scheduled

## 2020-06-05 NOTE — Assessment & Plan Note (Signed)
CSME OS, commence with therapy today follow-up in 5 to 6 weeks

## 2020-06-05 NOTE — Progress Notes (Addendum)
  06/05/2020     CHIEF COMPLAINT Patient presents for Retina Follow Up (2 WK FU OS, POSS AVASTIN OS///Pt reports stable vision OU, pt denies any new F/F OU, no pain or pressure OU. ///Last BS: 115 last night.)   HISTORY OF PRESENT ILLNESS: Shelly Jackson is a 86 y.o. female who presents to the clinic today for:   HPI    Retina Follow Up    Patient presents with  Diabetic Retinopathy.  In right eye.  This started 2 weeks ago.  Duration of 2 weeks.  Since onset it is stable. Additional comments: 2 WK FU OS, POSS AVASTIN OS   Pt reports stable vision OU, pt denies any new F/F OU, no pain or pressure OU.    Last BS: 115 last night.       Last edited by Trollinger, Lauren D on 06/05/2020  2:26 PM. (History)      Referring physician: Luking, Scott A, MD [address] [place],  [place] [address]  HISTORICAL INFORMATION:   Selected notes from the medical record:     Lab Results  Component Value Date   HGBA1C 6.5 (H) 03/11/2020     CURRENT MEDICATIONS: No current outpatient medications on file. (Ophthalmic Drugs)   No current facility-administered medications for this visit. (Ophthalmic Drugs)   Current Outpatient Medications (Other)  Medication Sig  . Accu-Chek Softclix Lancets lancets Use as instructed  . amLODipine (NORVASC) 2.5 MG tablet TAKE 1 TABLET BY MOUTH  DAILY  . aspirin 81 MG tablet Take 81 mg by mouth daily.  . blood glucose meter kit and supplies Dispense based on patient and insurance preference. Use to test blood sugar 3 times a day (FOR ICD-10 E10.9, E11.9).  . citalopram (CELEXA) 20 MG tablet TAKE 1 TABLET BY MOUTH  DAILY  . glucose blood (ONE TOUCH ULTRA TEST) test strip TEST BLOOD SUGAR THREE TIMES DAILY  . glucose blood test strip Use as instructed  . insulin glargine (LANTUS SOLOSTAR) 100 UNIT/ML Solostar Pen INJECT SUBCUTANEOUSLY 48  UNITS AT BEDTIME  . Insulin Pen Needle (B-D UF III MINI PEN NEEDLES) 31G X 5 MM MISC USE AS DIRECTED  .  Insulin Syringes, Disposable, U-100 1 ML MISC Use to administer insulin  . losartan (COZAAR) 50 MG tablet TAKE 1 TABLET BY MOUTH  DAILY  . metFORMIN (GLUCOPHAGE) 1000 MG tablet TAKE 1 TABLET BY MOUTH IN  THE MORNING AND ONE-HALF  TABLET BY MOUTH AT NIGHT  . pantoprazole (PROTONIX) 40 MG tablet TAKE 1 TABLET BY MOUTH  DAILY  . potassium chloride (KLOR-CON) 10 MEQ tablet Take 1 tablet on Mondays, wednesdays and fridays.  . pravastatin (PRAVACHOL) 10 MG tablet TAKE 1 TABLET BY MOUTH  DAILY  . triamcinolone cream (KENALOG) 0.1 % Apply thin amount twice a day as needed to help with itching around the umbilicus   No current facility-administered medications for this visit. (Other)      REVIEW OF SYSTEMS:    ALLERGIES Allergies  Allergen Reactions  . Daypro [Oxaprozin]     Indigestion     PAST MEDICAL HISTORY Past Medical History:  Diagnosis Date  . Acid reflux   . Anemia    Secondary to acute blood loss  . Anxiety   . Arthritis    RA  . CHF (congestive heart failure) (HCC)   . Diabetes mellitus   . Diabetic retinopathy (HCC)   . Diverticulitis 07/24/11   Inpatient [facility]  . History of kidney stones   .   Hypertension   . Obesity   . Rheumatoid arthritis(714.0)   . Sleep apnea   . Thrombocytopenia (HCC)   . Tubular adenoma of colon 07/24/11   Past Surgical History:  Procedure Laterality Date  . APPENDECTOMY    . CATARACT EXTRACTION Bilateral (right 08/2014) ( left 2005)  . CHOLECYSTECTOMY    . COLONOSCOPY  07/24/2011   Dr. Rehman-4 MM polyp at proximal ascending colon could not be removed because of difficult approach, pancolonic diverticulosis with diverticulitis involving the ascending colon, and suspected diverticular bleed, 7 mm polyp snared from proximal sigmoid colon with hemorrhagic surface found to be a tubular adenoma, small external hemorrhoids  . COLONOSCOPY  08/2007   Dr. Rourk-single hyperplastic polyp  . COLONOSCOPY N/A 12/14/2012   Procedure: COLONOSCOPY;  Surgeon:  Robert M Rourk, MD;  [facility];  Service: Endoscopy;  Laterality: N/A;  8:30  . CYSTOSCOPY/RETROGRADE/URETEROSCOPY Right 06/27/2017   Procedure: CYSTOSCOPY/RIGHT RETROGRADE PYELOGRAM/RIGHT URETEROSCOPY;  Surgeon: McKenzie, Patrick L, MD;  Location: [facility];  Service: Urology;  Laterality: Right;  . HOLMIUM LASER APPLICATION Right 06/27/2017   Procedure: HOLMIUM LASER LITHOTRIPSY RIGHT RENAL CALCULUS;  Surgeon: McKenzie, Patrick L, MD;  Location: [facility];  Service: Urology;  Laterality: Right;  . TONSILLECTOMY      FAMILY HISTORY Family History  Problem Relation Age of Onset  . Coronary artery disease Father   . Heart attack Father   . Cirrhosis Mother   . Hypertension Mother   . Colon cancer Neg Hx     SOCIAL HISTORY Social History   Tobacco Use  . Smoking status: Never Smoker  . Smokeless tobacco: Never Used  . Tobacco comment: Never  Vaping Use  . Vaping Use: Never used  Substance Use Topics  . Alcohol use: No  . Drug use: No         OPHTHALMIC EXAM:  Base Eye Exam    Visual Acuity (ETDRS)      Right Left   Dist [place] 20/50 -2 20/200   Dist ph [place] 20/25 -2 20/70 -2       Tonometry (Tonopen, 2:31 PM)      Right Left   Pressure 18 19       Pupils      Pupils Dark Light Shape React APD   Right PERRL 4 3 Round Brisk None   Left PERRL 3 3 Round Minimal None       Visual Fields (Counting fingers)      Left Right    Full Full       Extraocular Movement      Right Left    Full Full       Neuro/Psych    Oriented x3: Yes   Mood/Affect: Normal       Dilation    Left eye: 1.0% Mydriacyl, 2.5% Phenylephrine @ 2:32 PM        Slit Lamp and Fundus Exam    External Exam      Right Left   External Normal Normal       Slit Lamp Exam      Right Left   Lids/Lashes Normal Normal   Conjunctiva/Sclera White and quiet White and quiet   Cornea Clear Clear   Anterior Chamber Deep and quiet Deep and quiet   Iris Round and reactive Round and reactive    Lens Posterior chamber intraocular lens Posterior chamber intraocular lens   Anterior Vitreous Normal Normal       Fundus Exam        Right Left   Posterior Vitreous  Posterior vitreous detachment   Disc  Normal   C/D Ratio  0.3   Macula  Microaneurysms, Geographic atrophy   Vessels  PDR-quiet   Periphery  PRP,  attached,          IMAGING AND PROCEDURES  Imaging and Procedures for 06/09/20  OCT, Retina - OU - Both Eyes       Right Eye Quality was good. Scan locations included subfoveal. Central Foveal Thickness: 311. Progression has improved. Findings include abnormal foveal contour.   Left Eye Quality was borderline. Scan locations included subfoveal. Progression has worsened. Findings include abnormal foveal contour.   Notes Much less CSME 2 weeks post Avastin OD. Will follow up as scheduled OD.  OS, with CSME commence intravitreal Avastin OS today       Intravitreal Injection, Pharmacologic Agent - OS - Left Eye       Time Out 06/05/2020. 2:30 PM. Confirmed correct patient, procedure, site, and patient consented.   Anesthesia Topical anesthesia was used. Anesthetic medications included Akten 3.5%.   Procedure Preparation included Tobramycin 0.3%, Ofloxacin , 10% betadine to eyelids, 5% betadine to ocular surface. A 30 gauge needle was used.   Injection:  2.5 mg Bevacizumab (AVASTIN) 2.5mg/0.1mL SOSY   NDC: 71449-091-43, Lot: [id number]   Route: Intravitreal, Site: Left Eye  Post-op Post injection exam found visual acuity of at least counting fingers. The patient tolerated the procedure well. There were no complications. The patient received written and verbal post procedure care education. Post injection medications were not given.                 ASSESSMENT/PLAN:  Diabetic macular edema of right eye with proliferative retinopathy associated with type 2 diabetes mellitus (HCC) 2 weeks post injection Avastin OD improved overall repeat examination as  scheduled  Proliferative diabetic retinopathy of left eye with macular edema associated with type 2 diabetes mellitus (HCC) CSME OS, commence with therapy today follow-up in 5 to 6 weeks      ICD-10-CM   1. Proliferative diabetic retinopathy of left eye with macular edema associated with type 2 diabetes mellitus (HCC)  E11.3512 OCT, Retina - OU - Both Eyes    Intravitreal Injection, Pharmacologic Agent - OS - Left Eye    bevacizumab (AVASTIN) SOSY 2.5 mg  2. Diabetic macular edema of right eye with proliferative retinopathy associated with type 2 diabetes mellitus (HCC)  E11.3511     1.  Improve macular functioning right eye 2 weeks post injection Avastin  2.  Commence with injection Avastin OS today, follow-up left eye in 5 weeks to 6  3.  Dilate OD next as scheduled  Ophthalmic Meds Ordered this visit:  Meds ordered this encounter  Medications  . bevacizumab (AVASTIN) SOSY 2.5 mg       Return in about 6 weeks (around 07/17/2020) for dilate, OS, AVASTIN OCT.  There are no Patient Instructions on file for this visit.   Explained the diagnoses, plan, and follow up with the patient and they expressed understanding.  Patient expressed understanding of the importance of proper follow up care.   Gary A. Rankin M.D. Diseases & Surgery of the Retina and Vitreous [facility] 06/09/20     Abbreviations: M myopia (nearsighted); A astigmatism; H hyperopia (farsighted); P presbyopia; Mrx spectacle prescription;  CTL contact lenses; OD right eye; OS left eye; OU both eyes  XT exotropia; ET esotropia; PEK punctate epithelial keratitis; PEE punctate   epithelial erosions; DES dry eye syndrome; MGD meibomian gland dysfunction; ATs artificial tears; PFAT's preservative free artificial tears; NSC nuclear sclerotic cataract; PSC posterior subcapsular cataract; ERM epi-retinal membrane; PVD posterior vitreous detachment; RD retinal detachment; DM diabetes mellitus; DR diabetic  retinopathy; NPDR non-proliferative diabetic retinopathy; PDR proliferative diabetic retinopathy; CSME clinically significant macular edema; DME diabetic macular edema; dbh dot blot hemorrhages; CWS cotton wool spot; POAG primary open angle glaucoma; C/D cup-to-disc ratio; HVF humphrey visual field; GVF goldmann visual field; OCT optical coherence tomography; IOP intraocular pressure; BRVO Branch retinal vein occlusion; CRVO central retinal vein occlusion; CRAO central retinal artery occlusion; BRAO branch retinal artery occlusion; RT retinal tear; SB scleral buckle; PPV pars plana vitrectomy; VH Vitreous hemorrhage; PRP panretinal laser photocoagulation; IVK intravitreal kenalog; VMT vitreomacular traction; MH Macular hole;  NVD neovascularization of the disc; NVE neovascularization elsewhere; AREDS age related eye disease study; ARMD age related macular degeneration; POAG primary open angle glaucoma; EBMD epithelial/anterior basement membrane dystrophy; ACIOL anterior chamber intraocular lens; IOL intraocular lens; PCIOL posterior chamber intraocular lens; Phaco/IOL phacoemulsification with intraocular lens placement; PRK photorefractive keratectomy; LASIK laser assisted in situ keratomileusis; HTN hypertension; DM diabetes mellitus; COPD chronic obstructive pulmonary disease 

## 2020-06-09 DIAGNOSIS — E113512 Type 2 diabetes mellitus with proliferative diabetic retinopathy with macular edema, left eye: Secondary | ICD-10-CM | POA: Diagnosis not present

## 2020-06-09 MED ORDER — BEVACIZUMAB 2.5 MG/0.1ML IZ SOSY
2.5000 mg | PREFILLED_SYRINGE | INTRAVITREAL | Status: AC | PRN
  Administered 2020-06-09: 2.5 mg via INTRAVITREAL

## 2020-07-03 ENCOUNTER — Encounter (INDEPENDENT_AMBULATORY_CARE_PROVIDER_SITE_OTHER): Payer: Medicare Other | Admitting: Ophthalmology

## 2020-07-07 ENCOUNTER — Telehealth: Payer: Self-pay | Admitting: *Deleted

## 2020-07-07 NOTE — Telephone Encounter (Signed)
Discussed with pt. Pt does not have a home bp monitor but will bring her meds to the appt and follow up sooner if any problems.

## 2020-07-07 NOTE — Telephone Encounter (Signed)
Please bring all medications to that visit Also if has a home blood pressure cuff bring that as well

## 2020-07-07 NOTE — Telephone Encounter (Signed)
Dr. Ella Jubilee is calling to report pt's bp. He went out to her house to see her today and states bp lying was 180/86 pulse 67, standing 189/94 pulse 76. States pt told him she had taken bp meds. But also states he is not sure since she has cognitive impairment. He recommended to her to follow up within one week with dr Nicki Reaper. I called pt and got her scheduled for bp check up. She also told me she was taking both of her bp meds. She states she feels ok. I told her I send back a note to dr scott to see if any other recommendations.

## 2020-07-10 ENCOUNTER — Other Ambulatory Visit: Payer: Self-pay

## 2020-07-10 ENCOUNTER — Ambulatory Visit (INDEPENDENT_AMBULATORY_CARE_PROVIDER_SITE_OTHER): Payer: Medicare Other | Admitting: Ophthalmology

## 2020-07-10 ENCOUNTER — Encounter (INDEPENDENT_AMBULATORY_CARE_PROVIDER_SITE_OTHER): Payer: Self-pay | Admitting: Ophthalmology

## 2020-07-10 DIAGNOSIS — E113512 Type 2 diabetes mellitus with proliferative diabetic retinopathy with macular edema, left eye: Secondary | ICD-10-CM | POA: Diagnosis not present

## 2020-07-10 DIAGNOSIS — E113511 Type 2 diabetes mellitus with proliferative diabetic retinopathy with macular edema, right eye: Secondary | ICD-10-CM

## 2020-07-10 MED ORDER — BEVACIZUMAB 2.5 MG/0.1ML IZ SOSY
2.5000 mg | PREFILLED_SYRINGE | INTRAVITREAL | Status: AC | PRN
  Administered 2020-07-10: 2.5 mg via INTRAVITREAL

## 2020-07-10 NOTE — Progress Notes (Signed)
07/10/2020     CHIEF COMPLAINT Patient presents for Retina Follow Up (7 Wk F/U OD, poss Avastin OD//Pt sts VA has improved OU since last injections. No new symptoms reported OU.//LBS: 126 yesterday)   HISTORY OF PRESENT ILLNESS: Shelly Jackson is a 86 y.o. female who presents to the clinic today for:   HPI    Retina Follow Up    Patient presents with  Diabetic Retinopathy.  In right eye.  This started 7 weeks ago.  Severity is mild.  Duration of 7 weeks.  Since onset it is stable. Additional comments: 7 Wk F/U OD, poss Avastin OD  Pt sts VA has improved OU since last injections. No new symptoms reported OU.  LBS: 126 yesterday       Last edited by Rockie Neighbours, [place] on 07/10/2020  2:48 PM. (History)      Referring physician: Kathyrn Drown, MD [address] [place],  [place] [address]  HISTORICAL INFORMATION:   Selected notes from the MEDICAL RECORD NUMBER    Lab Results  Component Value Date   HGBA1C 6.5 (H) 03/11/2020     CURRENT MEDICATIONS: No current outpatient medications on file. (Ophthalmic Drugs)   No current facility-administered medications for this visit. (Ophthalmic Drugs)   Current Outpatient Medications (Other)  Medication Sig  . Accu-Chek Softclix Lancets lancets Use as instructed  . amLODipine (NORVASC) 2.5 MG tablet TAKE 1 TABLET BY MOUTH  DAILY  . aspirin 81 MG tablet Take 81 mg by mouth daily.  . blood glucose meter kit and supplies Dispense based on patient and insurance preference. Use to test blood sugar 3 times a day (FOR ICD-10 E10.9, E11.9).  . citalopram (CELEXA) 20 MG tablet TAKE 1 TABLET BY MOUTH  DAILY  . glucose blood (ONE TOUCH ULTRA TEST) test strip TEST BLOOD SUGAR THREE TIMES DAILY  . glucose blood test strip Use as instructed  . insulin glargine (LANTUS SOLOSTAR) 100 UNIT/ML Solostar Pen INJECT SUBCUTANEOUSLY 48  UNITS AT BEDTIME  . Insulin Pen Needle (B-D UF III MINI PEN NEEDLES) 31G X 5 MM MISC USE AS DIRECTED  .  Insulin Syringes, Disposable, U-100 1 ML MISC Use to administer insulin  . losartan (COZAAR) 50 MG tablet TAKE 1 TABLET BY MOUTH  DAILY  . metFORMIN (GLUCOPHAGE) 1000 MG tablet TAKE 1 TABLET BY MOUTH IN  THE MORNING AND ONE-HALF  TABLET BY MOUTH AT NIGHT  . pantoprazole (PROTONIX) 40 MG tablet TAKE 1 TABLET BY MOUTH  DAILY  . potassium chloride (KLOR-CON) 10 MEQ tablet Take 1 tablet on Mondays, wednesdays and fridays.  . pravastatin (PRAVACHOL) 10 MG tablet TAKE 1 TABLET BY MOUTH  DAILY  . triamcinolone cream (KENALOG) 0.1 % Apply thin amount twice a day as needed to help with itching around the umbilicus   No current facility-administered medications for this visit. (Other)      REVIEW OF SYSTEMS:    ALLERGIES Allergies  Allergen Reactions  . Daypro [Oxaprozin]     Indigestion     PAST MEDICAL HISTORY Past Medical History:  Diagnosis Date  . Acid reflux   . Anemia    Secondary to acute blood loss  . Anxiety   . Arthritis    RA  . CHF (congestive heart failure) ([place])   . Diabetes mellitus   . Diabetic retinopathy ([place])   . Diverticulitis 07/24/11   Inpatient [facility]  . History of kidney stones   . Hypertension   . Obesity   .  Rheumatoid arthritis(714.0)   . Sleep apnea   . Thrombocytopenia ([place])   . Tubular adenoma of colon 07/24/11   Past Surgical History:  Procedure Laterality Date  . APPENDECTOMY    . CATARACT EXTRACTION Bilateral (right 08/2014) ( left 2005)  . CHOLECYSTECTOMY    . COLONOSCOPY  07/24/2011   Dr. Deatra Ina MM polyp at proximal ascending colon could not be removed because of difficult approach, pancolonic diverticulosis with diverticulitis involving the ascending colon, and suspected diverticular bleed, 7 mm polyp snared from proximal sigmoid colon with hemorrhagic surface found to be a tubular adenoma, small external hemorrhoids  . COLONOSCOPY  08/2007   Dr. Malva Limes hyperplastic polyp  . COLONOSCOPY N/A 12/14/2012   Procedure: COLONOSCOPY;  Surgeon:  Daneil Dolin, MD;  [facility];  Service: Endoscopy;  Laterality: N/A;  8:30  . CYSTOSCOPY/RETROGRADE/URETEROSCOPY Right 06/27/2017   Procedure: CYSTOSCOPY/RIGHT RETROGRADE PYELOGRAM/RIGHT URETEROSCOPY;  Surgeon: Cleon Gustin, MD;  Location: [facility];  Service: Urology;  Laterality: Right;  . HOLMIUM LASER APPLICATION Right 8/76/8115   Procedure: HOLMIUM LASER LITHOTRIPSY RIGHT RENAL CALCULUS;  Surgeon: Cleon Gustin, MD;  Location: [facility];  Service: Urology;  Laterality: Right;  . TONSILLECTOMY      FAMILY HISTORY Family History  Problem Relation Age of Onset  . Coronary artery disease Father   . Heart attack Father   . Cirrhosis Mother   . Hypertension Mother   . Colon cancer Neg Hx     SOCIAL HISTORY Social History   Tobacco Use  . Smoking status: Never Smoker  . Smokeless tobacco: Never Used  . Tobacco comment: Never  Vaping Use  . Vaping Use: Never used  Substance Use Topics  . Alcohol use: No  . Drug use: No         OPHTHALMIC EXAM:  Base Eye Exam    Visual Acuity (ETDRS)      Right Left   Dist cc 20/30 -2 20/80 -3   Dist ph cc 20/25 -2 20/70 -2       Tonometry (Tonopen, 2:48 PM)      Right Left   Pressure 19 17       Pupils      Pupils Dark Light Shape React APD   Right PERRL 4 4 Round Minimal None   Left PERRL 4 4 Round Minimal None       Visual Fields (Counting fingers)      Left Right    Full Full       Extraocular Movement      Right Left    Full Full       Neuro/Psych    Oriented x3: Yes   Mood/Affect: Normal       Dilation    Right eye: 1.0% Mydriacyl, 2.5% Phenylephrine @ 2:53 PM        Slit Lamp and Fundus Exam    External Exam      Right Left   External Normal Normal       Slit Lamp Exam      Right Left   Lids/Lashes Normal Normal   Conjunctiva/Sclera White and quiet White and quiet   Cornea Clear Clear   Anterior Chamber Deep and quiet Deep and quiet   Iris Round and reactive Round and  reactive   Lens Posterior chamber intraocular lens Posterior chamber intraocular lens   Anterior Vitreous Normal Normal       Fundus Exam      Right Left  Posterior Vitreous Posterior vitreous detachment    Disc Normal    C/D Ratio 0.25    Macula Microaneurysms, Mild clinically significant macular edema, improved    Vessels PDR-quiet    Periphery PRP,  attached,           IMAGING AND PROCEDURES  Imaging and Procedures for 07/10/20  OCT, Retina - OU - Both Eyes       Right Eye Quality was good. Scan locations included subfoveal. Central Foveal Thickness: 303. Progression has improved. Findings include abnormal foveal contour.   Left Eye Quality was good. Scan locations included subfoveal. Central Foveal Thickness: 371. Progression has been stable. Findings include abnormal foveal contour.   Notes CSME OD much improved now 7 weeks post injection.  Left eye with chronic CSME follow-up as scheduled       Intravitreal Injection, Pharmacologic Agent - OD - Right Eye       Time Out 07/10/2020. 3:19 PM. Confirmed correct patient, procedure, site, and patient consented.   Anesthesia Topical anesthesia was used. Anesthetic medications included Akten 3.5%.   Procedure Preparation included Ofloxacin . A 30 gauge needle was used.   Injection:  2.5 mg Bevacizumab (AVASTIN) 2.62m/0.1mL SOSY   NDC:: 69629-528-41 Lot:: [id number]  Route: Intravitreal, Site: Right Eye  Post-op Post injection exam found visual acuity of at least counting fingers. The patient tolerated the procedure well. There were no complications. The patient received written and verbal post procedure care education. Post injection medications were not given.                 ASSESSMENT/PLAN:  Diabetic macular edema of right eye with proliferative retinopathy associated with type 2 diabetes mellitus ([place] The nature of regressed proliferative diabetic retinopathy was discussed with the patient. The  patient was advised to maintain good glucose, blood pressure, monitor kidney function and serum lipid control as advised by personal physician. Rare risk for reactivation of progression exist with untreated severe anemia, untreated renal failure, untreated heart failure, and smoking. Complete avoidance of smoking was recommended. The chance of recurrent proliferative diabetic retinopathy was discussed as well as the chance of vitreous hemorrhage for which further treatments may be necessary.   Explained to the patient that the quiescent  proliferative diabetic retinopathy disease is unlikely to ever worsen.  Worsening factors would include however severe anemia, hypertension out-of-control or impending renal failure.  CSME continues to improve however since onset of therapy January 2022  Proliferative diabetic retinopathy of left eye with macular edema associated with type 2 diabetes mellitus ([place] Chronically active CSME OS by OCT follow-up as scheduled      ICD-10-CM   1. Diabetic macular edema of right eye with proliferative retinopathy associated with type 2 diabetes mellitus (HCC)  E11.3511 OCT, Retina - OU - Both Eyes    Intravitreal Injection, Pharmacologic Agent - OD - Right Eye    bevacizumab (AVASTIN) SOSY 2.5 mg  2. Proliferative diabetic retinopathy of left eye with macular edema associated with type 2 diabetes mellitus ([place]  EU72.5366    1.  OD, much improved CSME post injection  Avastin 7 weeks ago repeat today and examination right eye again in 7 weeks  2.  Follow-up OS as scheduled  3.  Ophthalmic Meds Ordered this visit:  Meds ordered this encounter  Medications  . bevacizumab (AVASTIN) SOSY 2.5 mg       Return in about 7 weeks (around 08/28/2020) for dilate, OD, AVASTIN OCT.  There are no  Patient Instructions on file for this visit.   Explained the diagnoses, plan, and follow up with the patient and they expressed understanding.  Patient expressed understanding of  the importance of proper follow up care.   Clent Demark Thelda Gagan M.D. Diseases & Surgery of the Retina and Vitreous [facility] 07/10/20     Abbreviations: M myopia (nearsighted); A astigmatism; H hyperopia (farsighted); P presbyopia; Mrx spectacle prescription;  CTL contact lenses; OD right eye; OS left eye; OU both eyes  XT exotropia; ET esotropia; PEK punctate epithelial keratitis; PEE punctate epithelial erosions; DES dry eye syndrome; MGD meibomian gland dysfunction; ATs artificial tears; PFAT's preservative free artificial tears; [place] nuclear sclerotic cataract; PSC posterior subcapsular cataract; ERM epi-retinal membrane; PVD posterior vitreous detachment; RD retinal detachment; DM diabetes mellitus; DR diabetic retinopathy; NPDR non-proliferative diabetic retinopathy; PDR proliferative diabetic retinopathy; CSME clinically significant macular edema; DME diabetic macular edema; dbh dot blot hemorrhages; CWS cotton wool spot; POAG primary open angle glaucoma; C/D cup-to-disc ratio; HVF humphrey visual field; GVF goldmann visual field; OCT optical coherence tomography; IOP intraocular pressure; BRVO Branch retinal vein occlusion; CRVO central retinal vein occlusion; CRAO central retinal artery occlusion; BRAO branch retinal artery occlusion; RT retinal tear; SB scleral buckle; PPV pars plana vitrectomy; VH Vitreous hemorrhage; PRP panretinal laser photocoagulation; IVK intravitreal kenalog; VMT vitreomacular traction; MH Macular hole;  NVD neovascularization of the disc; NVE neovascularization elsewhere; AREDS age related eye disease study; ARMD age related macular degeneration; POAG primary open angle glaucoma; EBMD epithelial/anterior basement membrane dystrophy; ACIOL anterior chamber intraocular lens; IOL intraocular lens; PCIOL posterior chamber intraocular lens; Phaco/IOL phacoemulsification with intraocular lens placement; [place] photorefractive keratectomy; LASIK laser assisted in situ  keratomileusis; HTN hypertension; DM diabetes mellitus; COPD chronic obstructive pulmonary disease

## 2020-07-10 NOTE — Assessment & Plan Note (Addendum)
The nature of regressed proliferative diabetic retinopathy was discussed with the patient. The patient was advised to maintain good glucose, blood pressure, monitor kidney function and serum lipid control as advised by personal physician. Rare risk for reactivation of progression exist with untreated severe anemia, untreated renal failure, untreated heart failure, and smoking. Complete avoidance of smoking was recommended. The chance of recurrent proliferative diabetic retinopathy was discussed as well as the chance of vitreous hemorrhage for which further treatments may be necessary.   Explained to the patient that the quiescent  proliferative diabetic retinopathy disease is unlikely to ever worsen.  Worsening factors would include however severe anemia, hypertension out-of-control or impending renal failure.  CSME continues to improve however since onset of therapy January 2022

## 2020-07-10 NOTE — Assessment & Plan Note (Signed)
Chronically active CSME OS by OCT follow-up as scheduled

## 2020-07-15 ENCOUNTER — Other Ambulatory Visit: Payer: Self-pay

## 2020-07-15 ENCOUNTER — Other Ambulatory Visit: Payer: Self-pay | Admitting: Family Medicine

## 2020-07-15 ENCOUNTER — Encounter: Payer: Self-pay | Admitting: Family Medicine

## 2020-07-15 ENCOUNTER — Ambulatory Visit (INDEPENDENT_AMBULATORY_CARE_PROVIDER_SITE_OTHER): Payer: Medicare Other | Admitting: Family Medicine

## 2020-07-15 VITALS — BP 166/77 | HR 66 | Temp 94.6°F | Wt 199.0 lb

## 2020-07-15 DIAGNOSIS — E876 Hypokalemia: Secondary | ICD-10-CM | POA: Diagnosis not present

## 2020-07-15 DIAGNOSIS — Z9181 History of falling: Secondary | ICD-10-CM

## 2020-07-15 DIAGNOSIS — I10 Essential (primary) hypertension: Secondary | ICD-10-CM

## 2020-07-15 MED ORDER — POTASSIUM CHLORIDE ER 10 MEQ PO TBCR
EXTENDED_RELEASE_TABLET | ORAL | 1 refills | Status: DC
Start: 2020-07-15 — End: 2020-11-17

## 2020-07-15 MED ORDER — AMLODIPINE BESYLATE 5 MG PO TABS: ORAL_TABLET | ORAL | 1 refills | Status: DC

## 2020-07-15 NOTE — Patient Instructions (Signed)
Understanding Your Risk for Falls Each year, millions of people have serious injuries from falls. It is important to understand your risk for falling. Talk with your health care provider about your risk and what you can do to lower it. There are actions you can take at home to lower your risk and prevent falls. If you do have a serious fall, make sure to tell your health care provider. Falling once raises your risk of falling again. How can falls affect me? Serious injuries from falls are common. These include:  Broken bones, such as hip fractures.  Head injuries, such as traumatic brain injuries (TBI) or concussion. A fear of falling can cause you to avoid activities and stay at home. This can make your muscles weaker and actually raise your risk for a fall. What can increase my risk? There are a number of risk factors that increase your risk for falling. The more risk factors you have, the higher your risk of falling. Serious injuries from a fall happen most often to people older than age 51. Children and young adults ages 39-29 are also at higher risk. Common risk factors include:  Weakness in the lower body.  Lack (deficiency) of vitamin D.  Being generally weak or confused due to long-term (chronic) illness.  Dizziness or balance problems.  Poor vision.  Medicines that cause dizziness or drowsiness. These can include medicines for your blood pressure, heart, anxiety, insomnia, or edema, as well as pain medicines and muscle relaxants. Other risk factors include:  Drinking alcohol.  Having had a fall in the past.  Having depression.  Having foot pain or wearing improper footwear.  Working at a dangerous job.  Having any of the following in your home: ? Tripping hazards, such as floor clutter or loose rugs. ? Poor lighting. ? Pets.  Having dementia or memory loss. What actions can I take to lower my risk of falling? Physical activity Maintain physical fitness. Do  strength and balance exercises. Consider taking a regular class to build strength and balance. Yoga and tai chi are good options. Vision Have your eyes checked every year and your vision prescription updated as needed. Walking aids and footwear  Wear nonskid shoes. Do not wear high heels.  Do not walk around the house in socks or slippers.  Use a cane or walker as told by your health care provider. Home safety  Attach secure railings on both sides of your stairs.  Install grab bars for your tub, shower, and toilet. Use a bath mat in your tub or shower.  Use good lighting in all rooms. Keep a flashlight near your bed.  Make sure there is a clear path from your bed to the bathroom. Use night-lights.  Do not use throw rugs. Make sure all carpeting is taped or tacked down securely.  Remove all clutter from walkways and stairways, including extension cords.  Repair uneven or broken steps.  Avoid walking on icy or slippery surfaces. Walk on the grass instead of on icy or slick sidewalks. Use ice melt to get rid of ice on walkways.  Use a cordless phone.      Questions to ask your health care provider  Can you help me check my risk for a fall?  Do any of my medicines make me more likely to fall?  Should I take a vitamin D supplement?  What exercises can I do to improve my strength and balance?  Should I make an appointment to have my vision  checked?  Do I need a bone density test to check for weak bones or osteoporosis?  Would it help to use a cane or a walker? Where to find more information  Centers for Disease Control and Prevention, STEADI: www.cdc.gov  Community-Based Fall Prevention Programs: www.cdc.gov  National Institute on Aging: www.nia.nih.gov Contact a health care provider if:  You fall at home.  You are afraid of falling at home.  You feel weak, drowsy, or dizzy. Summary  Serious injuries from a fall happen most often to people older than age 65.  Children and young adults ages 15-29 are also at higher risk.  Talk with your health care provider about your risks for falling and how to lower those risks.  Taking certain precautions at home can lower your risk for falling.  If you fall, always tell your health care provider. This information is not intended to replace advice given to you by your health care provider. Make sure you discuss any questions you have with your health care provider. Document Revised: 11/28/2019 Document Reviewed: 11/28/2019 Elsevier Patient Education  2021 Elsevier Inc.  

## 2020-07-15 NOTE — Progress Notes (Signed)
Pt here for HTN. Pt states she is not sure how her blood pressure is at home. Home visit doctor came last Friday. Dr.Eric Eanes called on 07/07/20 and reported on patient.   Pt has been falling frequently. Pt states that she averages about one fall per month. Most recent fall was last Wednesday.     Patient ID: Shelly Jackson, female    DOB: 1934-11-25, 86 y.o.   MRN: [id number]   Chief Complaint  Patient presents with  . Hypertension   Subjective:  CC: follow-up on elevated blood pressure  This is a new problem.  Presents today for evaluation for elevated blood pressure and frequent falls.  Reports that her [place] came to her home for an assessment and her blood pressure was in the 180s/90s.  She reports that she checks her blood sugars daily and they usually range in the 140s.  Also reports a history of frequent falls.  Reports that she falls about 1 time per month.  The last fall was last week she described herself as losing her balance, she is going up the ramp at her home, and another time the dog got between her legs and caused her to fall.  She has life alert, she called them and they sent EMS, she did not go to the hospital at this last episode.  Denies fever, chills, chest pain, shortness of breath, does report balance issues and falling and negative for headaches.    Medical History Jazira has a past medical history of Acid reflux, Anemia, Anxiety, Arthritis, CHF (congestive heart failure) ([place]), Diabetes mellitus, Diabetic retinopathy ([place]), Diverticulitis (07/24/11), History of kidney stones, Hypertension, Obesity, Rheumatoid arthritis(714.0), Sleep apnea, Thrombocytopenia ([place]), and Tubular adenoma of colon (07/24/11).   Outpatient Encounter Medications as of 07/15/2020  Medication Sig  . Accu-Chek Softclix Lancets lancets Use as instructed  . aspirin 81 MG tablet Take 81 mg by mouth daily.  . blood glucose meter kit and supplies Dispense based on patient and insurance  preference. Use to test blood sugar 3 times a day (FOR ICD-10 E10.9, E11.9).  . citalopram (CELEXA) 20 MG tablet TAKE 1 TABLET BY MOUTH  DAILY  . glucose blood (ONE TOUCH ULTRA TEST) test strip TEST BLOOD SUGAR THREE TIMES DAILY  . glucose blood test strip Use as instructed  . insulin glargine (LANTUS SOLOSTAR) 100 UNIT/ML Solostar Pen INJECT SUBCUTANEOUSLY 48  UNITS AT BEDTIME  . Insulin Pen Needle (B-D UF III MINI PEN NEEDLES) 31G X 5 MM MISC USE AS DIRECTED  . Insulin Syringes, Disposable, U-100 1 ML MISC Use to administer insulin  . losartan (COZAAR) 50 MG tablet TAKE 1 TABLET BY MOUTH  DAILY  . metFORMIN (GLUCOPHAGE) 1000 MG tablet TAKE 1 TABLET BY MOUTH IN  THE MORNING AND ONE-HALF  TABLET BY MOUTH AT NIGHT  . pantoprazole (PROTONIX) 40 MG tablet TAKE 1 TABLET BY MOUTH  DAILY  . pravastatin (PRAVACHOL) 10 MG tablet TAKE 1 TABLET BY MOUTH  DAILY  . triamcinolone cream (KENALOG) 0.1 % Apply thin amount twice a day as needed to help with itching around the umbilicus  . [DISCONTINUED] amLODipine (NORVASC) 2.5 MG tablet TAKE 1 TABLET BY MOUTH  DAILY  . [DISCONTINUED] potassium chloride (KLOR-CON) 10 MEQ tablet Take 1 tablet on Mondays, wednesdays and fridays.  Marland Kitchen amLODipine (NORVASC) 5 MG tablet Take one tablet by mouth daily.  . potassium chloride (KLOR-CON) 10 MEQ tablet Take 1 tablet on Mondays, wednesdays and fridays.   No facility-administered encounter  medications on file as of 07/15/2020.     Review of Systems  Constitutional: Negative for chills and fever.  Respiratory: Negative for shortness of breath.   Cardiovascular: Negative for chest pain and leg swelling.  Gastrointestinal: Negative for abdominal pain.  Musculoskeletal: Positive for gait problem. Negative for back pain.       Reports loses balance and falls.   Neurological: Negative for headaches.     Vitals BP (!) 166/77   Pulse 66   Temp (!) 94.6 F (34.8 C)   Wt 199 lb (90.3 kg)   SpO2 96%   BMI 36.40 kg/m    Objective:   Physical Exam Vitals reviewed.  Constitutional:      Appearance: Normal appearance.  Cardiovascular:     Rate and Rhythm: Normal rate and regular rhythm.     Heart sounds: Normal heart sounds.  Pulmonary:     Effort: Pulmonary effort is normal.     Breath sounds: Normal breath sounds.  Skin:    General: Skin is warm and dry.  Neurological:     General: No focal deficit present.     Mental Status: She is alert.  Psychiatric:        Behavior: Behavior normal.      Assessment and Plan   1. Elevated blood pressure reading with diagnosis of hypertension - amLODipine (NORVASC) 5 MG tablet; Take one tablet by mouth daily.  Dispense: 30 tablet; Refill: 1 - Comprehensive Metabolic Panel (CMET)  2. Personal history of fall - Ambulatory referral to Physical Therapy  3. Hypokalemia - potassium chloride (KLOR-CON) 10 MEQ tablet; Take 1 tablet on Mondays, wednesdays and fridays.  Dispense: 36 tablet; Refill: 1 - Comprehensive Metabolic Panel (CMET)   Consulted with Dr. Wolfgang Phoenix while patient in the office. Repeat manual blood pressure by this provider: 180/90. Will increase amlodipine dose to 5 mg and follow-up in one month.  Reports she needs a refill on potassium.  Personal history of falls: referral to physical therapy sent today.  Reiterated safety, and avoiding falls, to avoid serious injury.  Agrees with plan of care discussed today. Understands warning signs to seek further care: chest pain, shortness of breath, any significant change in health.  Understands to follow-up in 1 month, will get CMP drawn today, will notify once lab results are available.  CMP drawn today due to report of taking her potassium supplement more than prescribed.  She apparently thought it was to be taken daily, and Dr. Sallee Lange ordered it to be taken 3 times per week.  Will assess potassium level today, and make decision at that time.    Chalmers Guest, NP 07/15/2020

## 2020-07-16 LAB — COMPREHENSIVE METABOLIC PANEL
ALT: 12 IU/L (ref 0–32)
AST: 14 IU/L (ref 0–40)
Albumin/Globulin Ratio: 1.8 (ref 1.2–2.2)
Albumin: 4.3 g/dL (ref 3.6–4.6)
Alkaline Phosphatase: 88 IU/L (ref 44–121)
BUN/Creatinine Ratio: 17 (ref 12–28)
BUN: 14 mg/dL (ref 8–27)
Bilirubin Total: 0.6 mg/dL (ref 0.0–1.2)
CO2: 24 mmol/L (ref 20–29)
Calcium: 9 mg/dL (ref 8.7–10.3)
Chloride: 105 mmol/L (ref 96–106)
Creatinine, Ser: 0.84 mg/dL (ref 0.57–1.00)
Globulin, Total: 2.4 g/dL (ref 1.5–4.5)
Glucose: 85 mg/dL (ref 65–99)
Potassium: 4.1 mmol/L (ref 3.5–5.2)
Sodium: 144 mmol/L (ref 134–144)
Total Protein: 6.7 g/dL (ref 6.0–8.5)
eGFR: 69 mL/min/{1.73_m2} (ref >59)

## 2020-07-17 ENCOUNTER — Ambulatory Visit (INDEPENDENT_AMBULATORY_CARE_PROVIDER_SITE_OTHER): Payer: Medicare Other | Admitting: Ophthalmology

## 2020-07-17 ENCOUNTER — Encounter (INDEPENDENT_AMBULATORY_CARE_PROVIDER_SITE_OTHER): Payer: Self-pay | Admitting: Ophthalmology

## 2020-07-17 ENCOUNTER — Other Ambulatory Visit: Payer: Self-pay

## 2020-07-17 DIAGNOSIS — E113512 Type 2 diabetes mellitus with proliferative diabetic retinopathy with macular edema, left eye: Secondary | ICD-10-CM

## 2020-07-17 DIAGNOSIS — E113511 Type 2 diabetes mellitus with proliferative diabetic retinopathy with macular edema, right eye: Secondary | ICD-10-CM

## 2020-07-17 MED ORDER — BEVACIZUMAB 2.5 MG/0.1ML IZ SOSY
2.5000 mg | PREFILLED_SYRINGE | INTRAVITREAL | Status: AC | PRN
  Administered 2020-07-17: 2.5 mg via INTRAVITREAL

## 2020-07-17 NOTE — Progress Notes (Signed)
07/17/2020     CHIEF COMPLAINT Patient presents for Retina Follow Up (6 Week PDR f\u OS. Possible Avastin OS. OCT/Pt states no changes in vision. Denies new complaints. Pt had a fall last Wednesday./BGL: did not check)   HISTORY OF PRESENT ILLNESS: Shelly Jackson is a 86 y.o. female who presents to the clinic today for:   HPI    Retina Follow Up    Patient presents with  Diabetic Retinopathy.  In left eye.  Severity is moderate.  Duration of 6 weeks.  Since onset it is stable.  I, the attending physician,  performed the HPI with the patient and updated documentation appropriately. Additional comments: 6 Week PDR f\u OS. Possible Avastin OS. OCT Pt states no changes in vision. Denies new complaints. Pt had a fall last Wednesday. BGL: did not check       Last edited by Tilda Franco on 07/17/2020  2:43 PM. (History)      Referring physician: Kathyrn Drown, MD [address] [place],  [place] [address]  HISTORICAL INFORMATION:   Selected notes from the MEDICAL RECORD NUMBER    Lab Results  Component Value Date   HGBA1C 6.5 (H) 03/11/2020     CURRENT MEDICATIONS: No current outpatient medications on file. (Ophthalmic Drugs)   No current facility-administered medications for this visit. (Ophthalmic Drugs)   Current Outpatient Medications (Other)  Medication Sig  . Accu-Chek Softclix Lancets lancets Use as instructed  . amLODipine (NORVASC) 5 MG tablet Take one tablet by mouth daily.  Marland Kitchen aspirin 81 MG tablet Take 81 mg by mouth daily.  . blood glucose meter kit and supplies Dispense based on patient and insurance preference. Use to test blood sugar 3 times a day (FOR ICD-10 E10.9, E11.9).  . citalopram (CELEXA) 20 MG tablet TAKE 1 TABLET BY MOUTH  DAILY  . glucose blood (ONE TOUCH ULTRA TEST) test strip TEST BLOOD SUGAR THREE TIMES DAILY  . glucose blood test strip Use as instructed  . insulin glargine (LANTUS SOLOSTAR) 100 UNIT/ML Solostar Pen INJECT  SUBCUTANEOUSLY 48  UNITS AT BEDTIME  . Insulin Pen Needle (B-D UF III MINI PEN NEEDLES) 31G X 5 MM MISC USE AS DIRECTED  . Insulin Syringes, Disposable, U-100 1 ML MISC Use to administer insulin  . losartan (COZAAR) 50 MG tablet TAKE 1 TABLET BY MOUTH  DAILY  . metFORMIN (GLUCOPHAGE) 1000 MG tablet TAKE 1 TABLET BY MOUTH IN  THE MORNING AND ONE-HALF  TABLET BY MOUTH AT NIGHT  . pantoprazole (PROTONIX) 40 MG tablet TAKE 1 TABLET BY MOUTH  DAILY  . potassium chloride (KLOR-CON) 10 MEQ tablet Take 1 tablet on Mondays, wednesdays and fridays.  . pravastatin (PRAVACHOL) 10 MG tablet TAKE 1 TABLET BY MOUTH  DAILY  . triamcinolone cream (KENALOG) 0.1 % Apply thin amount twice a day as needed to help with itching around the umbilicus   No current facility-administered medications for this visit. (Other)      REVIEW OF SYSTEMS: ROS    Positive for: Endocrine   Last edited by Tilda Franco on 07/17/2020  2:43 PM. (History)       ALLERGIES Allergies  Allergen Reactions  . Daypro [Oxaprozin]     Indigestion     PAST MEDICAL HISTORY Past Medical History:  Diagnosis Date  . Acid reflux   . Anemia    Secondary to acute blood loss  . Anxiety   . Arthritis    RA  .  CHF (congestive heart failure) ([place])   . Diabetes mellitus   . Diabetic retinopathy ([place])   . Diverticulitis 07/24/11   Inpatient [facility]  . History of kidney stones   . Hypertension   . Obesity   . Rheumatoid arthritis(714.0)   . Sleep apnea   . Thrombocytopenia ([place])   . Tubular adenoma of colon 07/24/11   Past Surgical History:  Procedure Laterality Date  . APPENDECTOMY    . CATARACT EXTRACTION Bilateral (right 08/2014) ( left 2005)  . CHOLECYSTECTOMY    . COLONOSCOPY  07/24/2011   Dr. Deatra Ina MM polyp at proximal ascending colon could not be removed because of difficult approach, pancolonic diverticulosis with diverticulitis involving the ascending colon, and suspected diverticular bleed, 7 mm polyp snared from  proximal sigmoid colon with hemorrhagic surface found to be a tubular adenoma, small external hemorrhoids  . COLONOSCOPY  08/2007   Dr. Malva Limes hyperplastic polyp  . COLONOSCOPY N/A 12/14/2012   Procedure: COLONOSCOPY;  Surgeon: Daneil Dolin, MD;  Location: [facility];  Service: Endoscopy;  Laterality: N/A;  8:30  . CYSTOSCOPY/RETROGRADE/URETEROSCOPY Right 06/27/2017   Procedure: CYSTOSCOPY/RIGHT RETROGRADE PYELOGRAM/RIGHT URETEROSCOPY;  Surgeon: Cleon Gustin, MD;  Location: [facility];  Service: Urology;  Laterality: Right;  . HOLMIUM LASER APPLICATION Right 8/41/3244   Procedure: HOLMIUM LASER LITHOTRIPSY RIGHT RENAL CALCULUS;  Surgeon: Cleon Gustin, MD;  Location: [facility];  Service: Urology;  Laterality: Right;  . TONSILLECTOMY      FAMILY HISTORY Family History  Problem Relation Age of Onset  . Coronary artery disease Father   . Heart attack Father   . Cirrhosis Mother   . Hypertension Mother   . Colon cancer Neg Hx     SOCIAL HISTORY Social History   Tobacco Use  . Smoking status: Never Smoker  . Smokeless tobacco: Never Used  . Tobacco comment: Never  Vaping Use  . Vaping Use: Never used  Substance Use Topics  . Alcohol use: No  . Drug use: No         OPHTHALMIC EXAM:  Base Eye Exam    Visual Acuity (Snellen - Linear)      Right Left   Dist cc 20/40 -2 20/200 +1   Dist ph cc 20/30 -3 20/60 +2       Tonometry (Tonopen, 2:50 PM)      Right Left   Pressure 14 14       Pupils      Pupils Dark Light Shape React APD   Right PERRL 4 3 Round Brisk None   Left PERRL 4 3 Round Sluggish None       Visual Fields (Counting fingers)      Left Right    Full Full       Neuro/Psych    Oriented x3: Yes   Mood/Affect: Normal       Dilation    Left eye: 1.0% Mydriacyl, 2.5% Phenylephrine @ 2:50 PM        Slit Lamp and Fundus Exam    External Exam      Right Left   External Normal Normal       Slit Lamp Exam      Right Left    Lids/Lashes Normal Normal   Conjunctiva/Sclera White and quiet White and quiet   Cornea Clear Clear   Anterior Chamber Deep and quiet Deep and quiet   Iris Round and reactive Round and reactive   Lens Posterior chamber intraocular lens Posterior chamber  intraocular lens   Anterior Vitreous Normal Normal       Fundus Exam      Right Left   Posterior Vitreous  Posterior vitreous detachment   Disc  Normal   C/D Ratio  0.3   Macula  Microaneurysms, Geographic atrophy, Mild clinically significant macular edema   Vessels  PDR-quiet   Periphery  PRP,  attached,          IMAGING AND PROCEDURES  Imaging and Procedures for 07/17/20  OCT, Retina - OU - Both Eyes       Right Eye Quality was good. Scan locations included subfoveal. Central Foveal Thickness: 303. Progression has improved. Findings include abnormal foveal contour.   Left Eye Quality was good. Scan locations included subfoveal. Central Foveal Thickness: 371. Progression has been stable. Findings include abnormal foveal contour, cystoid macular edema.   Notes Center involved CSME improved OD 1 week postinjection  OS with center involved CSME for injection Avastin today       Intravitreal Injection, Pharmacologic Agent - OS - Left Eye       Time Out 07/17/2020. 3:37 PM. Confirmed correct patient, procedure, site, and patient consented.   Anesthesia Topical anesthesia was used. Anesthetic medications included Akten 3.5%.   Procedure Preparation included Tobramycin 0.3%, Ofloxacin , 10% betadine to eyelids, 5% betadine to ocular surface. A 30 gauge needle was used.   Injection:  2.5 mg Bevacizumab (AVASTIN) 2.61m/0.1mL SOSY   NDC:: 05697-948-01 Lot:: [id number]  Route: Intravitreal, Site: Left Eye  Post-op Post injection exam found visual acuity of at least counting fingers. The patient tolerated the procedure well. There were no complications. The patient received written and verbal post procedure care  education. Post injection medications were not given.                 ASSESSMENT/PLAN:  Diabetic macular edema of right eye with proliferative retinopathy associated with type 2 diabetes mellitus (HCC) At 1 week follow-up improved CSME OD  Proliferative diabetic retinopathy of left eye with macular edema associated with type 2 diabetes mellitus (HCC) Involved CSME OS, for intravitreal Avastin OS today and examination next in 8 weeks      ICD-10-CM   1. Proliferative diabetic retinopathy of left eye with macular edema associated with type 2 diabetes mellitus (HCC)  EO70.7867OCT, Retina - OU - Both Eyes    Intravitreal Injection, Pharmacologic Agent - OS - Left Eye    bevacizumab (AVASTIN) SOSY 2.5 mg  2. Diabetic macular edema of right eye with proliferative retinopathy associated with type 2 diabetes mellitus ([place]  E11.3511     1.  Injection intravitreal Avastin OS today and dilate next OS in 8 weeks  2.  D follow-up as scheduled  3.  Ophthalmic Meds Ordered this visit:  Meds ordered this encounter  Medications  . bevacizumab (AVASTIN) SOSY 2.5 mg       Return in about 8 weeks (around 09/11/2020) for dilate, OS, AVASTIN OCT.  There are no Patient Instructions on file for this visit.   Explained the diagnoses, plan, and follow up with the patient and they expressed understanding.  Patient expressed understanding of the importance of proper follow up care.   GClent DemarkRankin M.D. Diseases & Surgery of the Retina and Vitreous Retina & Diabetic [place]03/10/22     Abbreviations: M myopia (nearsighted); A astigmatism; H hyperopia (farsighted); P presbyopia; Mrx spectacle prescription;  CTL contact lenses; OD right eye; OS left eye; OU both  eyes  XT exotropia; ET esotropia; PEK punctate epithelial keratitis; PEE punctate epithelial erosions; DES dry eye syndrome; MGD meibomian gland dysfunction; ATs artificial tears; PFAT's preservative free artificial tears; Paint  nuclear sclerotic cataract; PSC posterior subcapsular cataract; ERM epi-retinal membrane; PVD posterior vitreous detachment; RD retinal detachment; DM diabetes mellitus; DR diabetic retinopathy; NPDR non-proliferative diabetic retinopathy; PDR proliferative diabetic retinopathy; CSME clinically significant macular edema; DME diabetic macular edema; dbh dot blot hemorrhages; CWS cotton wool spot; POAG primary open angle glaucoma; C/D cup-to-disc ratio; HVF humphrey visual field; GVF goldmann visual field; OCT optical coherence tomography; IOP intraocular pressure; BRVO Branch retinal vein occlusion; CRVO central retinal vein occlusion; CRAO central retinal artery occlusion; BRAO branch retinal artery occlusion; RT retinal tear; SB scleral buckle; PPV pars plana vitrectomy; VH Vitreous hemorrhage; PRP panretinal laser photocoagulation; IVK intravitreal kenalog; VMT vitreomacular traction; MH Macular hole;  NVD neovascularization of the disc; NVE neovascularization elsewhere; AREDS age related eye disease study; ARMD age related macular degeneration; POAG primary open angle glaucoma; EBMD epithelial/anterior basement membrane dystrophy; ACIOL anterior chamber intraocular lens; IOL intraocular lens; PCIOL posterior chamber intraocular lens; Phaco/IOL phacoemulsification with intraocular lens placement; [place] photorefractive keratectomy; LASIK laser assisted in situ keratomileusis; HTN hypertension; DM diabetes mellitus; COPD chronic obstructive pulmonary disease

## 2020-07-17 NOTE — Assessment & Plan Note (Signed)
Involved CSME OS, for intravitreal Avastin OS today and examination next in 8 weeks

## 2020-07-17 NOTE — Assessment & Plan Note (Signed)
At 1 week follow-up improved CSME OD

## 2020-07-23 IMAGING — US US RENAL
1 series · 14 of 25 positions shown · non-contrast
Comparison: CT 08/25/2017.  Renal ultrasound, 08/05/2017.

CLINICAL DATA: Followup kidney stones.

EXAM:
RENAL / URINARY TRACT ULTRASOUND COMPLETE

[Series 1: us renal · 0.26mm/px · 14 of 48 slices shown]
[im 1/48]
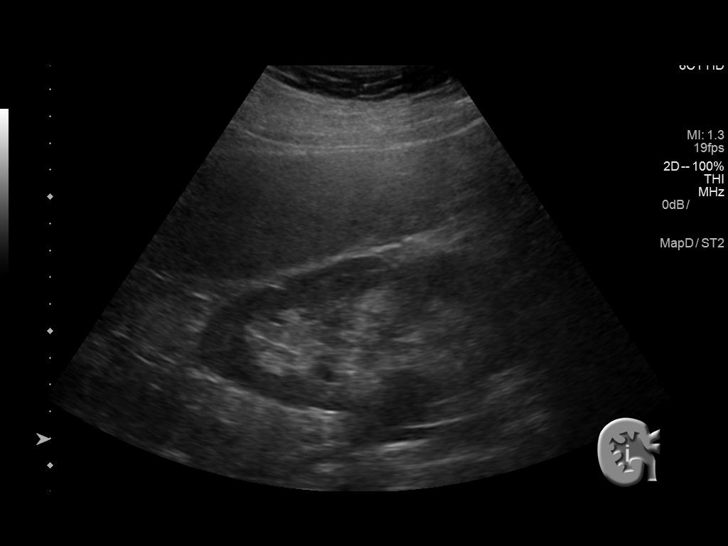
[im 4/48]
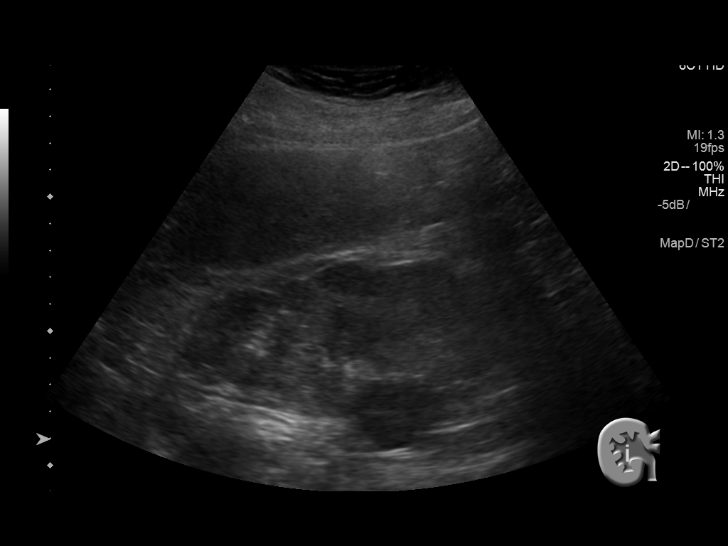
[im 8/48]
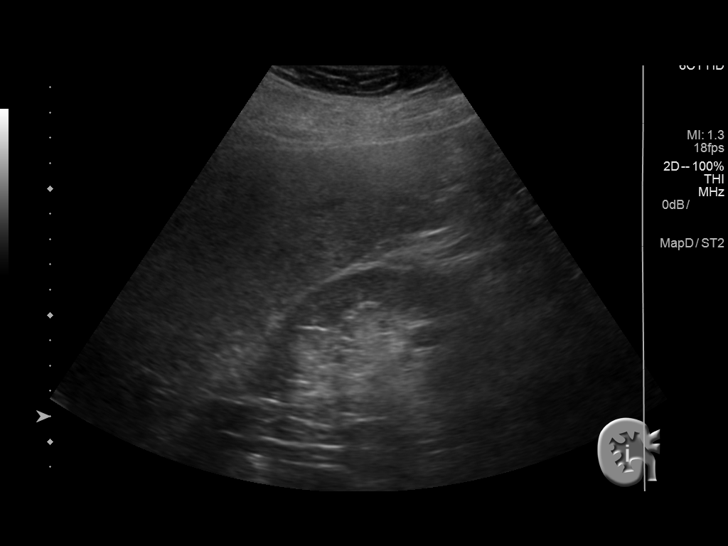
[im 12/48]
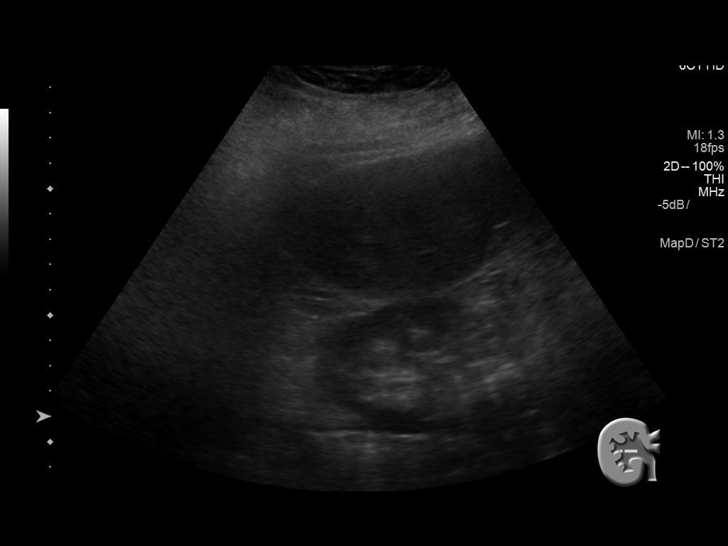
[im 16/48]
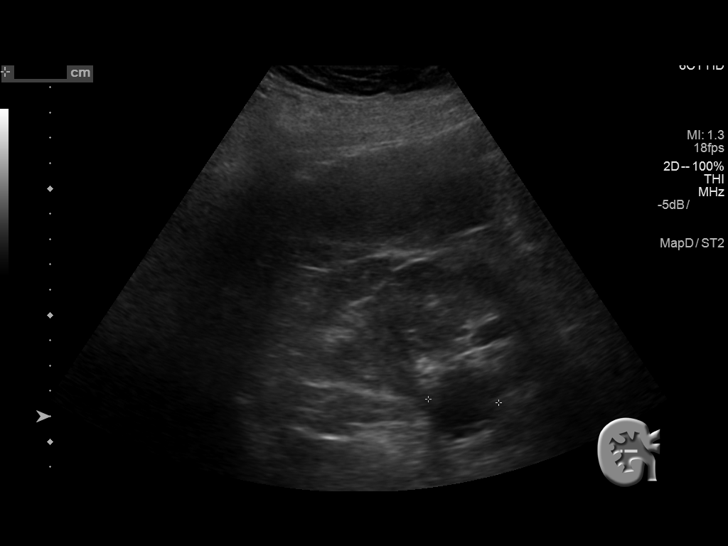
[im 18/48]
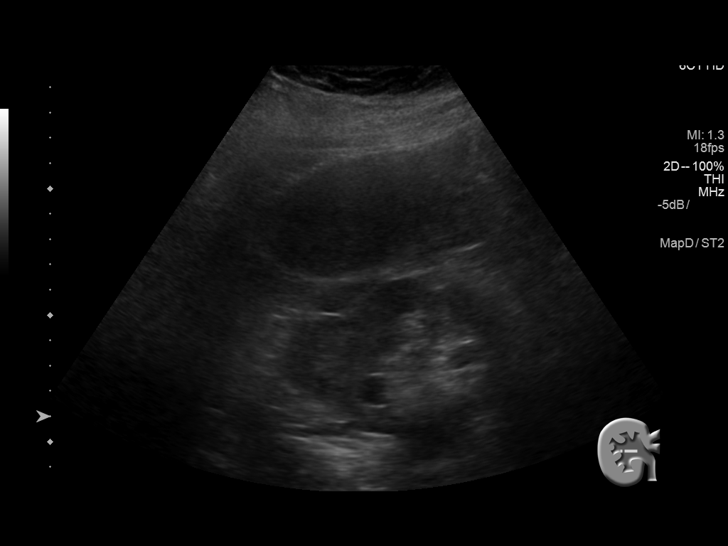
[im 22/48]
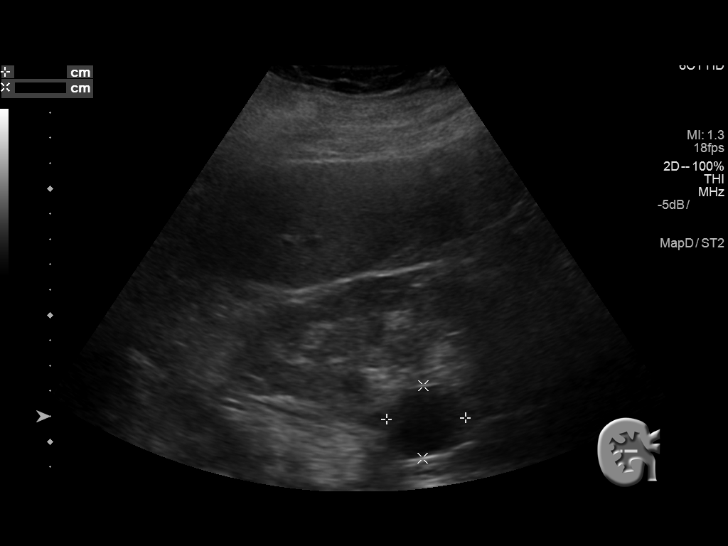
[im 26/48]
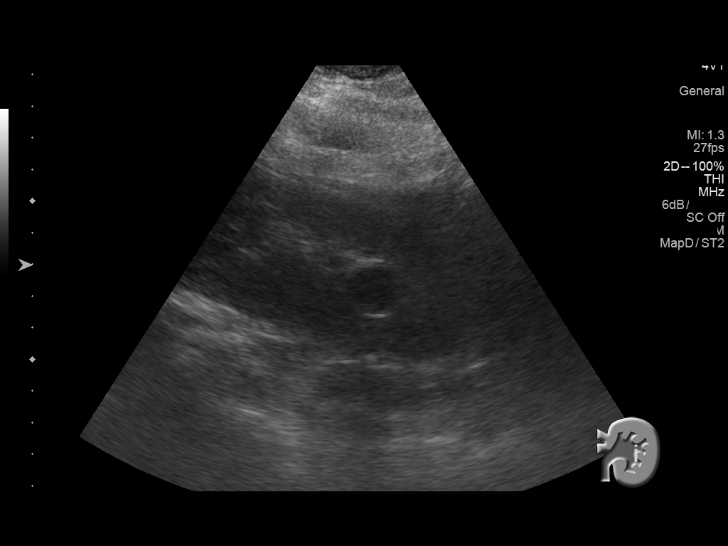
[im 30/48]
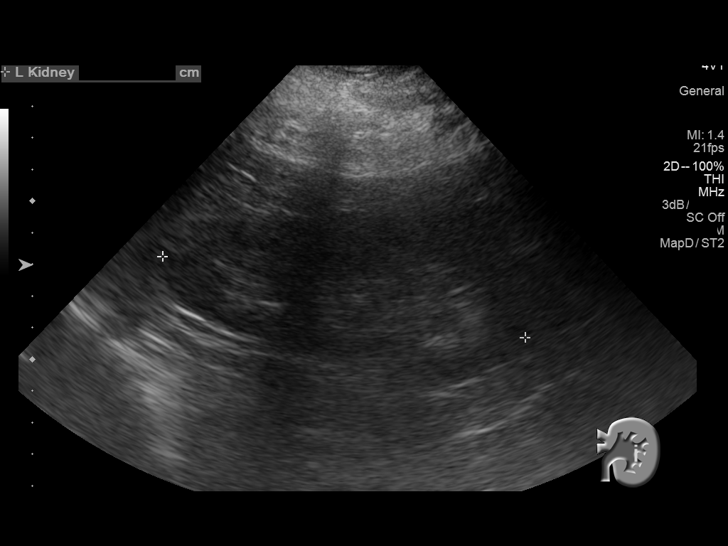
[im 32/48]
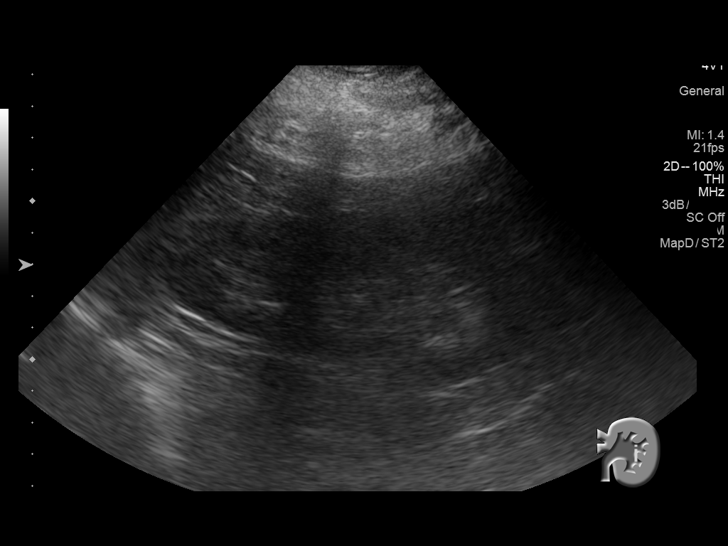
[im 36/48]
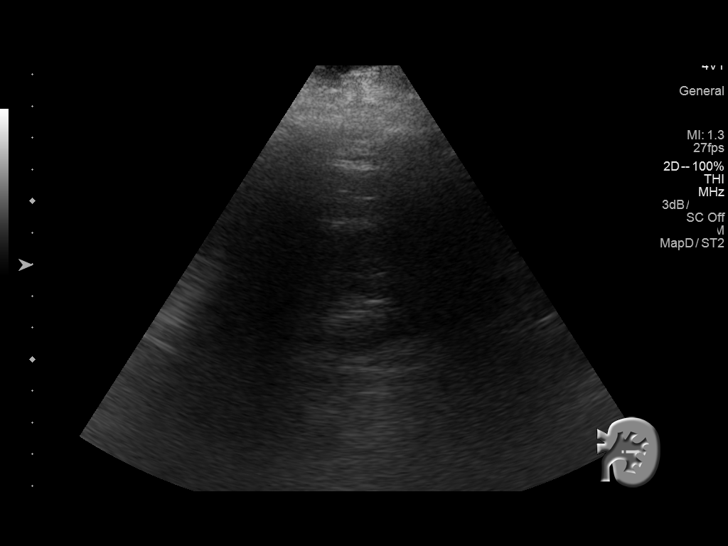
[im 40/48]
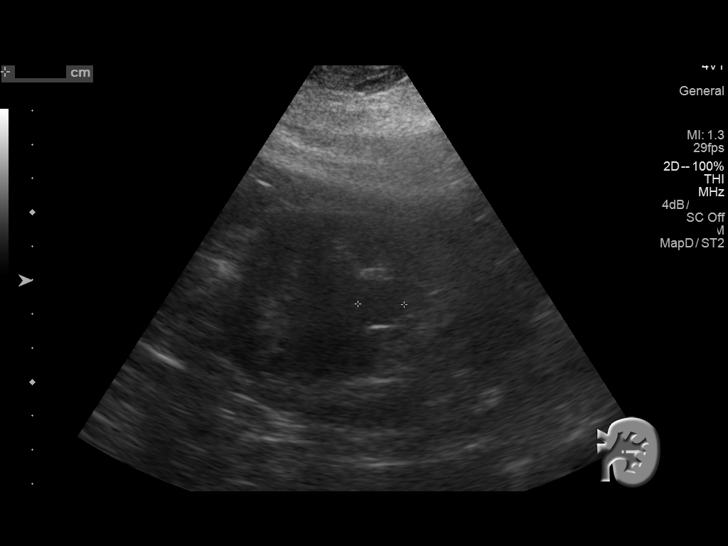
[im 44/48]
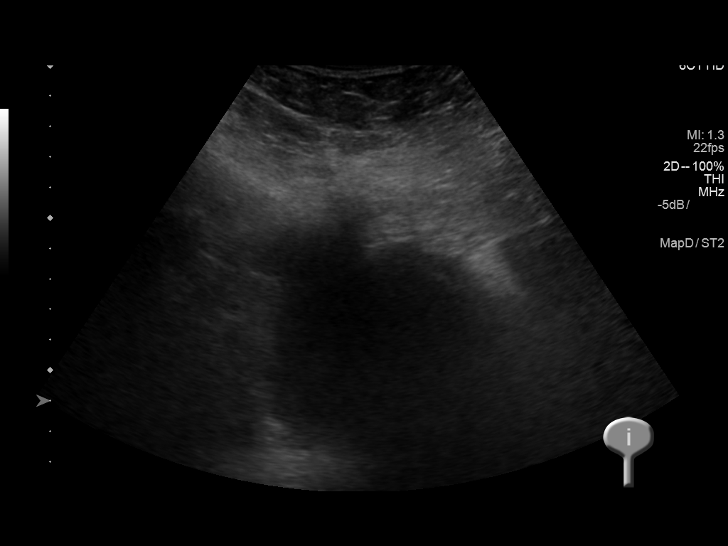
[im 48/48]
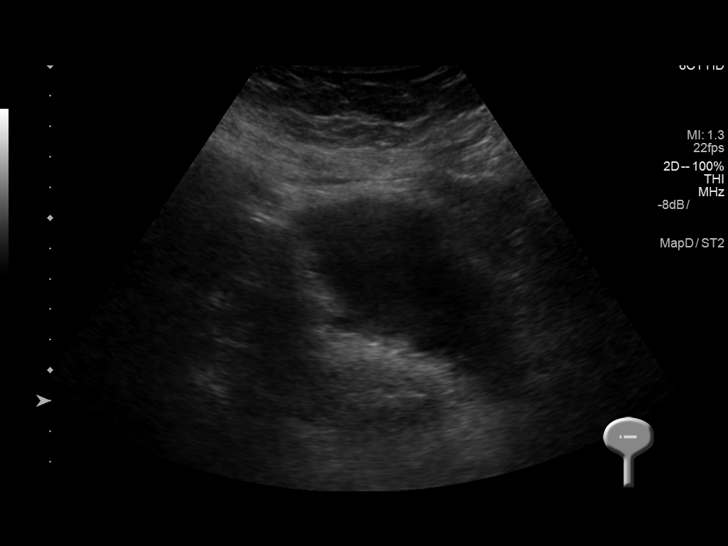

[14 of 25 positions shown; findings below may reference images not displayed]

FINDINGS: Right Kidney:

Length: 12.2 cm. Normal parenchymal echogenicity. Small medial upper
pole cyst measuring 17 mm. Posterior midpole cyst measuring 3.1 cm.
No solid renal masses. No stones. No hydronephrosis.

Left Kidney:

Length: 11.7 cm. Normal parenchymal echogenicity. 1.7 cm lower pole
cyst. No other masses, no stones and no hydronephrosis.

Bladder:

Appears normal for degree of bladder distention.
IMPRESSION: 1. No renal stones or hydronephrosis.  No acute findings.
2. Normal renal sizes.
3. Two small cysts in the right kidney stable from the prior renal
ultrasound. Small left renal cyst stable from the prior ultrasound.
The upper pole cyst seen on the prior ultrasound was not visualized
on the current exam due to decreased visualization of this portion
of the kidney from bowel gas.

## 2020-08-15 ENCOUNTER — Encounter: Payer: Medicare Other | Admitting: Family Medicine

## 2020-08-15 ENCOUNTER — Encounter: Payer: Self-pay | Admitting: Family Medicine

## 2020-08-18 ENCOUNTER — Telehealth: Payer: Self-pay | Admitting: Family Medicine

## 2020-08-18 NOTE — Telephone Encounter (Signed)
Attempted to schedule AWV. Unable to LVM.  Will try at later time.  

## 2020-08-26 ENCOUNTER — Other Ambulatory Visit: Payer: Self-pay

## 2020-08-26 ENCOUNTER — Encounter (INDEPENDENT_AMBULATORY_CARE_PROVIDER_SITE_OTHER): Payer: Self-pay | Admitting: Ophthalmology

## 2020-08-26 ENCOUNTER — Ambulatory Visit (INDEPENDENT_AMBULATORY_CARE_PROVIDER_SITE_OTHER): Payer: Medicare Other | Admitting: Ophthalmology

## 2020-08-26 DIAGNOSIS — E113511 Type 2 diabetes mellitus with proliferative diabetic retinopathy with macular edema, right eye: Secondary | ICD-10-CM | POA: Diagnosis not present

## 2020-08-26 DIAGNOSIS — E113512 Type 2 diabetes mellitus with proliferative diabetic retinopathy with macular edema, left eye: Secondary | ICD-10-CM | POA: Diagnosis not present

## 2020-08-26 MED ORDER — BEVACIZUMAB 2.5 MG/0.1ML IZ SOSY
2.5000 mg | PREFILLED_SYRINGE | INTRAVITREAL | Status: AC | PRN
  Administered 2020-08-26: 2.5 mg via INTRAVITREAL

## 2020-08-26 NOTE — Assessment & Plan Note (Signed)
Follow up as scheduled.  

## 2020-08-26 NOTE — Assessment & Plan Note (Signed)
We will retreat CSME today with Avastin to decrease vegF load, and will need PRP in temporal quadrant to complete this treatment and likely maintain stability to long-term

## 2020-08-26 NOTE — Progress Notes (Signed)
08/26/2020     CHIEF COMPLAINT Patient presents for Retina Follow Up (7 Wk F/U OD, poss Avastin OD//Pt denies noticeable changes to [place] OU since last visit. Pt denies ocular pain, flashes of light, or floaters OU. //LBS: 128 last night)   HISTORY OF PRESENT ILLNESS: Shelly Jackson is a 86 y.o. female who presents to the clinic today for:   HPI    Retina Follow Up    Patient presents with  Diabetic Retinopathy.  In right eye.  This started 7 weeks ago.  Severity is mild.  Duration of 7 weeks.  Since onset it is stable. Additional comments: 7 Wk F/U OD, poss Avastin OD  Pt denies noticeable changes to [place] OU since last visit. Pt denies ocular pain, flashes of light, or floaters OU.   LBS: 128 last night       Last edited by Rockie Neighbours, [place] on 08/26/2020  2:33 PM. (History)      Referring physician: Kathyrn Drown, MD [address] [place],  [place] [address]  HISTORICAL INFORMATION:   Selected notes from the MEDICAL RECORD NUMBER    Lab Results  Component Value Date   HGBA1C 6.5 (H) 03/11/2020     CURRENT MEDICATIONS: No current outpatient medications on file. (Ophthalmic Drugs)   No current facility-administered medications for this visit. (Ophthalmic Drugs)   Current Outpatient Medications (Other)  Medication Sig  . Accu-Chek Softclix Lancets lancets Use as instructed  . amLODipine (NORVASC) 5 MG tablet Take one tablet by mouth daily.  Marland Kitchen aspirin 81 MG tablet Take 81 mg by mouth daily.  . blood glucose meter kit and supplies Dispense based on patient and insurance preference. Use to test blood sugar 3 times a day (FOR ICD-10 E10.9, E11.9).  . citalopram (CELEXA) 20 MG tablet TAKE 1 TABLET BY MOUTH  DAILY  . glucose blood (ONE TOUCH ULTRA TEST) test strip TEST BLOOD SUGAR THREE TIMES DAILY  . glucose blood test strip Use as instructed  . insulin glargine (LANTUS SOLOSTAR) 100 UNIT/ML Solostar Pen INJECT SUBCUTANEOUSLY 48  UNITS AT BEDTIME  . Insulin Pen  Needle (B-D UF III MINI PEN NEEDLES) 31G X 5 MM MISC USE AS DIRECTED  . Insulin Syringes, Disposable, U-100 1 ML MISC Use to administer insulin  . losartan (COZAAR) 50 MG tablet TAKE 1 TABLET BY MOUTH  DAILY  . metFORMIN (GLUCOPHAGE) 1000 MG tablet TAKE 1 TABLET BY MOUTH IN  THE MORNING AND ONE-HALF  TABLET BY MOUTH AT NIGHT  . pantoprazole (PROTONIX) 40 MG tablet TAKE 1 TABLET BY MOUTH  DAILY  . potassium chloride (KLOR-CON) 10 MEQ tablet Take 1 tablet on Mondays, wednesdays and fridays.  . pravastatin (PRAVACHOL) 10 MG tablet TAKE 1 TABLET BY MOUTH  DAILY  . triamcinolone cream (KENALOG) 0.1 % Apply thin amount twice a day as needed to help with itching around the umbilicus   No current facility-administered medications for this visit. (Other)      REVIEW OF SYSTEMS:    ALLERGIES Allergies  Allergen Reactions  . Daypro [Oxaprozin]     Indigestion     PAST MEDICAL HISTORY Past Medical History:  Diagnosis Date  . Acid reflux   . Anemia    Secondary to acute blood loss  . Anxiety   . Arthritis    RA  . CHF (congestive heart failure) ([place])   . Diabetes mellitus   . Diabetic retinopathy ([place])   . Diverticulitis 07/24/11   Inpatient  [facility]  . History of kidney stones   . Hypertension   . Obesity   . Rheumatoid arthritis(714.0)   . Sleep apnea   . Thrombocytopenia ([place])   . Tubular adenoma of colon 07/24/11   Past Surgical History:  Procedure Laterality Date  . APPENDECTOMY    . CATARACT EXTRACTION Bilateral (right 08/2014) ( left 2005)  . CHOLECYSTECTOMY    . COLONOSCOPY  07/24/2011   Dr. Deatra Ina MM polyp at proximal ascending colon could not be removed because of difficult approach, pancolonic diverticulosis with diverticulitis involving the ascending colon, and suspected diverticular bleed, 7 mm polyp snared from proximal sigmoid colon with hemorrhagic surface found to be a tubular adenoma, small external hemorrhoids  . COLONOSCOPY  08/2007   Dr. Malva Limes  hyperplastic polyp  . COLONOSCOPY N/A 12/14/2012   Procedure: COLONOSCOPY;  Surgeon: Daneil Dolin, MD;  [facility];  Service: Endoscopy;  Laterality: N/A;  8:30  . CYSTOSCOPY/RETROGRADE/URETEROSCOPY Right 06/27/2017   Procedure: CYSTOSCOPY/RIGHT RETROGRADE PYELOGRAM/RIGHT URETEROSCOPY;  Surgeon: Cleon Gustin, MD;  Location: [facility];  Service: Urology;  Laterality: Right;  . HOLMIUM LASER APPLICATION Right 5/32/9924   Procedure: HOLMIUM LASER LITHOTRIPSY RIGHT RENAL CALCULUS;  Surgeon: Cleon Gustin, MD;  Location: [facility];  Service: Urology;  Laterality: Right;  . TONSILLECTOMY      FAMILY HISTORY Family History  Problem Relation Age of Onset  . Coronary artery disease Father   . Heart attack Father   . Cirrhosis Mother   . Hypertension Mother   . Colon cancer Neg Hx     SOCIAL HISTORY Social History   Tobacco Use  . Smoking status: Never Smoker  . Smokeless tobacco: Never Used  . Tobacco comment: Never  Vaping Use  . Vaping Use: Never used  Substance Use Topics  . Alcohol use: No  . Drug use: No         OPHTHALMIC EXAM: Base Eye Exam    Visual Acuity (ETDRS)      Right Left   Dist cc 20/30 +2 20/70 -3   Dist ph cc NI 20/60 +1   Correction: Glasses       Tonometry (Tonopen, 2:37 PM)      Right Left   Pressure 19 20       Pupils      Pupils Dark Light Shape React APD   Right PERRL 4 3 Round Brisk None   Left PERRL 4 3 Round Brisk None       Visual Fields (Counting fingers)      Left Right    Full Full       Extraocular Movement      Right Left    Full Full       Neuro/Psych    Oriented x3: Yes   Mood/Affect: Normal       Dilation    Right eye: 1.0% Mydriacyl, 2.5% Phenylephrine @ 2:37 PM        Slit Lamp and Fundus Exam    External Exam      Right Left   External Normal Normal       Slit Lamp Exam      Right Left   Lids/Lashes Normal Normal   Conjunctiva/Sclera White and quiet White and quiet   Cornea Clear  Clear   Anterior Chamber Deep and quiet Deep and quiet   Iris Round and reactive Round and reactive   Lens Posterior chamber intraocular lens Posterior chamber intraocular lens  Anterior Vitreous Normal Normal       Fundus Exam      Right Left   Posterior Vitreous Posterior vitreous detachment    Disc Normal    C/D Ratio 0.25    Macula Microaneurysms, Mild clinically significant macular edema, improved    Vessels PDR-quiet    Periphery PRP,  attached, room temporally for completion of PRP, will need to decrease vegF load           IMAGING AND PROCEDURES  Imaging and Procedures for 08/26/20  OCT, Retina - OU - Both Eyes       Right Eye Quality was good. Scan locations included subfoveal. Central Foveal Thickness: 303. Progression has improved. Findings include abnormal foveal contour.   Left Eye Quality was good. Scan locations included subfoveal. Central Foveal Thickness: 361. Progression has been stable. Findings include abnormal foveal contour, cystoid macular edema.   Notes Center involved CSME improved OD   7 week postinjection, much improved as compared to January 2022.  OS with center involved CSME       Intravitreal Injection, Pharmacologic Agent - OD - Right Eye       Time Out 08/26/2020. 3:07 PM. Confirmed correct patient, procedure, site, and patient consented.   Procedure Preparation included Ofloxacin . A 30 gauge needle was used.   Injection:  2.5 mg Bevacizumab (AVASTIN) 2.85m/0.1mL SOSY   NDC:: 75916-384-66 Lot:: [id number]  Route: Intravitreal, Site: Right Eye  Post-op Post injection exam found visual acuity of at least counting fingers. The patient tolerated the procedure well. There were no complications. The patient received written and verbal post procedure care education. Post injection medications were not given.                 ASSESSMENT/PLAN:  Diabetic macular edema of right eye with proliferative retinopathy associated with  type 2 diabetes mellitus (HCC) We will retreat CSME today with Avastin to decrease vegF load, and will need PRP in temporal quadrant to complete this treatment and likely maintain stability to long-term      ICD-10-CM   1. Diabetic macular edema of right eye with proliferative retinopathy associated with type 2 diabetes mellitus (HCC)  E11.3511 OCT, Retina - OU - Both Eyes    Intravitreal Injection, Pharmacologic Agent - OD - Right Eye    bevacizumab (AVASTIN) SOSY 2.5 mg    1.  OD, repeat injection intravitreal Avastin today and follow-up in 6 weeks for PRP completion and likely observation thereafter  2.  OS follow-up as scheduled  3.  Ophthalmic Meds Ordered this visit:  Meds ordered this encounter  Medications  . bevacizumab (AVASTIN) SOSY 2.5 mg       Return in about 6 weeks (around 10/07/2020) for dilate, OD, PRP.  There are no Patient Instructions on file for this visit.   Explained the diagnoses, plan, and follow up with the patient and they expressed understanding.  Patient expressed understanding of the importance of proper follow up care.   GClent DemarkRankin M.D. Diseases & Surgery of the Retina and Vitreous Retina & Diabetic EF[place]04/19/22     Abbreviations: M myopia (nearsighted); A astigmatism; H hyperopia (farsighted); P presbyopia; Mrx spectacle prescription;  CTL contact lenses; OD right eye; OS left eye; OU both eyes  XT exotropia; ET esotropia; PEK punctate epithelial keratitis; PEE punctate epithelial erosions; DES dry eye syndrome; MGD meibomian gland dysfunction; ATs artificial tears; PFAT's preservative free artificial tears; NLesslienuclear sclerotic cataract; PSC posterior subcapsular  cataract; ERM epi-retinal membrane; PVD posterior vitreous detachment; RD retinal detachment; DM diabetes mellitus; DR diabetic retinopathy; NPDR non-proliferative diabetic retinopathy; PDR proliferative diabetic retinopathy; CSME clinically significant macular edema; DME  diabetic macular edema; dbh dot blot hemorrhages; CWS cotton wool spot; POAG primary open angle glaucoma; C/D cup-to-disc ratio; HVF humphrey visual field; GVF goldmann visual field; OCT optical coherence tomography; IOP intraocular pressure; BRVO Branch retinal vein occlusion; CRVO central retinal vein occlusion; CRAO central retinal artery occlusion; BRAO branch retinal artery occlusion; RT retinal tear; SB scleral buckle; PPV pars plana vitrectomy; VH Vitreous hemorrhage; PRP panretinal laser photocoagulation; IVK intravitreal kenalog; VMT vitreomacular traction; MH Macular hole;  NVD neovascularization of the disc; NVE neovascularization elsewhere; AREDS age related eye disease study; ARMD age related macular degeneration; POAG primary open angle glaucoma; EBMD epithelial/anterior basement membrane dystrophy; ACIOL anterior chamber intraocular lens; IOL intraocular lens; PCIOL posterior chamber intraocular lens; Phaco/IOL phacoemulsification with intraocular lens placement; [place] photorefractive keratectomy; LASIK laser assisted in situ keratomileusis; HTN hypertension; DM diabetes mellitus; COPD chronic obstructive pulmonary disease

## 2020-09-10 ENCOUNTER — Other Ambulatory Visit: Payer: Self-pay | Admitting: Family Medicine

## 2020-09-11 ENCOUNTER — Ambulatory Visit (INDEPENDENT_AMBULATORY_CARE_PROVIDER_SITE_OTHER): Payer: Medicare Other | Admitting: Ophthalmology

## 2020-09-11 ENCOUNTER — Encounter (INDEPENDENT_AMBULATORY_CARE_PROVIDER_SITE_OTHER): Payer: Self-pay | Admitting: Ophthalmology

## 2020-09-11 ENCOUNTER — Other Ambulatory Visit: Payer: Self-pay

## 2020-09-11 DIAGNOSIS — E113511 Type 2 diabetes mellitus with proliferative diabetic retinopathy with macular edema, right eye: Secondary | ICD-10-CM

## 2020-09-11 DIAGNOSIS — E113512 Type 2 diabetes mellitus with proliferative diabetic retinopathy with macular edema, left eye: Secondary | ICD-10-CM | POA: Diagnosis not present

## 2020-09-11 MED ORDER — BEVACIZUMAB 2.5 MG/0.1ML IZ SOSY
2.5000 mg | PREFILLED_SYRINGE | INTRAVITREAL | Status: AC | PRN
  Administered 2020-09-11: 2.5 mg via INTRAVITREAL

## 2020-09-11 NOTE — Assessment & Plan Note (Signed)
Improved 2 weeks after most recent injection Avastin for CSME

## 2020-09-11 NOTE — Progress Notes (Signed)
09/11/2020     CHIEF COMPLAINT Patient presents for Retina Follow Up (8 Wk F/U OS, poss Avastin OS//Pt denies noticeable changes to [place] OU since last visit. Pt denies ocular pain, flashes of light, or floaters OU. //LBS: 120's on average)   HISTORY OF PRESENT ILLNESS: Shelly Jackson is a 86 y.o. female who presents to the clinic today for:   HPI    Retina Follow Up    Diagnosis: Diabetic Retinopathy   Laterality: left eye   Onset: 8 weeks ago   Severity: mild   Duration: 8 weeks   Course: stable   Comments: 8 Wk F/U OS, poss Avastin OS  Pt denies noticeable changes to [place] OU since last visit. Pt denies ocular pain, flashes of light, or floaters OU.   LBS: 120's on average       Last edited by Rockie Neighbours, [place] on 09/11/2020  2:37 PM. (History)      Referring physician: Kathyrn Drown, MD [address] [place],  [place] [address]  HISTORICAL INFORMATION:   Selected notes from the MEDICAL RECORD NUMBER    Lab Results  Component Value Date   HGBA1C 6.5 (H) 03/11/2020     CURRENT MEDICATIONS: No current outpatient medications on file. (Ophthalmic Drugs)   No current facility-administered medications for this visit. (Ophthalmic Drugs)   Current Outpatient Medications (Other)  Medication Sig  . Accu-Chek Softclix Lancets lancets Use as instructed  . amLODipine (NORVASC) 5 MG tablet Take one tablet by mouth daily.  Marland Kitchen aspirin 81 MG tablet Take 81 mg by mouth daily.  . blood glucose meter kit and supplies Dispense based on patient and insurance preference. Use to test blood sugar 3 times a day (FOR ICD-10 E10.9, E11.9).  . citalopram (CELEXA) 20 MG tablet TAKE 1 TABLET BY MOUTH  DAILY  . glucose blood (ONE TOUCH ULTRA TEST) test strip TEST BLOOD SUGAR THREE TIMES DAILY  . glucose blood test strip Use as instructed  . insulin glargine (LANTUS SOLOSTAR) 100 UNIT/ML Solostar Pen INJECT SUBCUTANEOUSLY 48  UNITS AT BEDTIME  . Insulin Pen Needle (B-D UF III MINI PEN  NEEDLES) 31G X 5 MM MISC USE AS DIRECTED  . Insulin Syringes, Disposable, U-100 1 ML MISC Use to administer insulin  . losartan (COZAAR) 50 MG tablet TAKE 1 TABLET BY MOUTH  DAILY  . metFORMIN (GLUCOPHAGE) 1000 MG tablet TAKE 1 TABLET BY MOUTH IN  THE MORNING AND ONE-HALF  TABLET BY MOUTH AT NIGHT  . pantoprazole (PROTONIX) 40 MG tablet TAKE 1 TABLET BY MOUTH  DAILY  . potassium chloride (KLOR-CON) 10 MEQ tablet Take 1 tablet on Mondays, wednesdays and fridays.  . pravastatin (PRAVACHOL) 10 MG tablet TAKE 1 TABLET BY MOUTH  DAILY  . triamcinolone cream (KENALOG) 0.1 % Apply thin amount twice a day as needed to help with itching around the umbilicus   No current facility-administered medications for this visit. (Other)      REVIEW OF SYSTEMS:    ALLERGIES Allergies  Allergen Reactions  . Daypro [Oxaprozin]     Indigestion     PAST MEDICAL HISTORY Past Medical History:  Diagnosis Date  . Acid reflux   . Anemia    Secondary to acute blood loss  . Anxiety   . Arthritis    RA  . CHF (congestive heart failure) ([place])   . Diabetes mellitus   . Diabetic retinopathy ([place])   . Diverticulitis 07/24/11   Inpatient [facility]  .  History of kidney stones   . Hypertension   . Obesity   . Rheumatoid arthritis(714.0)   . Sleep apnea   . Thrombocytopenia ([place])   . Tubular adenoma of colon 07/24/11   Past Surgical History:  Procedure Laterality Date  . APPENDECTOMY    . CATARACT EXTRACTION Bilateral (right 08/2014) ( left 2005)  . CHOLECYSTECTOMY    . COLONOSCOPY  07/24/2011   Dr. Deatra Ina MM polyp at proximal ascending colon could not be removed because of difficult approach, pancolonic diverticulosis with diverticulitis involving the ascending colon, and suspected diverticular bleed, 7 mm polyp snared from proximal sigmoid colon with hemorrhagic surface found to be a tubular adenoma, small external hemorrhoids  . COLONOSCOPY  08/2007   Dr. Malva Limes hyperplastic polyp  . COLONOSCOPY N/A  12/14/2012   Procedure: COLONOSCOPY;  Surgeon: Daneil Dolin, MD;  [facility];  Service: Endoscopy;  Laterality: N/A;  8:30  . CYSTOSCOPY/RETROGRADE/URETEROSCOPY Right 06/27/2017   Procedure: CYSTOSCOPY/RIGHT RETROGRADE PYELOGRAM/RIGHT URETEROSCOPY;  Surgeon: Cleon Gustin, MD;  Location: [facility];  Service: Urology;  Laterality: Right;  . HOLMIUM LASER APPLICATION Right 5/63/1497   Procedure: HOLMIUM LASER LITHOTRIPSY RIGHT RENAL CALCULUS;  Surgeon: Cleon Gustin, MD;  Location: [facility];  Service: Urology;  Laterality: Right;  . TONSILLECTOMY      FAMILY HISTORY Family History  Problem Relation Age of Onset  . Coronary artery disease Father   . Heart attack Father   . Cirrhosis Mother   . Hypertension Mother   . Colon cancer Neg Hx     SOCIAL HISTORY Social History   Tobacco Use  . Smoking status: Never Smoker  . Smokeless tobacco: Never Used  . Tobacco comment: Never  Vaping Use  . Vaping Use: Never used  Substance Use Topics  . Alcohol use: No  . Drug use: No         OPHTHALMIC EXAM:  Base Eye Exam    Visual Acuity (ETDRS)      Right Left   Dist cc 20/60 -2 20/60   Dist ph cc NI NI   Correction: Glasses       Tonometry (Tonopen, 2:43 PM)      Right Left   Pressure 16 21       Pupils      Pupils Dark Light Shape React APD   Right PERRL 5 4 Round Brisk None   Left PERRL 5 4 Round Brisk None       Visual Fields (Counting fingers)      Left Right    Full Full       Extraocular Movement      Right Left    Full Full       Neuro/Psych    Oriented x3: Yes   Mood/Affect: Normal       Dilation    Left eye: 1.0% Mydriacyl, 2.5% Phenylephrine @ 2:43 PM        Slit Lamp and Fundus Exam    External Exam      Right Left   External Normal Normal       Slit Lamp Exam      Right Left   Lids/Lashes Normal Normal   Conjunctiva/Sclera White and quiet White and quiet   Cornea Clear Clear   Anterior Chamber Deep and quiet Deep  and quiet   Iris Round and reactive Round and reactive   Lens Posterior chamber intraocular lens Posterior chamber intraocular lens   Anterior Vitreous Normal Normal  Fundus Exam      Right Left   Posterior Vitreous  Posterior vitreous detachment   Disc  Normal   C/D Ratio  0.3   Macula  Microaneurysms, Geographic atrophy, Mild clinically significant macular edema   Vessels  PDR-quiet   Periphery  PRP,  attached,          IMAGING AND PROCEDURES  Imaging and Procedures for 09/11/20  OCT, Retina - OU - Both Eyes       Right Eye Quality was good. Scan locations included subfoveal. Central Foveal Thickness: 305. Progression has improved. Findings include abnormal foveal contour.   Left Eye Quality was good. Scan locations included subfoveal. Central Foveal Thickness: 314. Progression has been stable. Findings include abnormal foveal contour, cystoid macular edema.   Notes Center involved CSME improved OD  , much improved as compared to January 2022.  OS with center involved CSME improved over the last 2 weeks since last injection right eye, will repeat injection today and repeat examination in 10 weeks       Intravitreal Injection, Pharmacologic Agent - OS - Left Eye       Time Out 09/11/2020. 3:19 PM. Confirmed correct patient, procedure, site, and patient consented.   Anesthesia Topical anesthesia was used. Anesthetic medications included [place] 3.5%.   Procedure Preparation included Tobramycin 0.3%, Ofloxacin , 10% betadine to eyelids, 5% betadine to ocular surface. A 30 gauge needle was used.   Injection:  2.5 mg Bevacizumab (AVASTIN) 2.61m/0.1mL SOSY   NDC:: 17409-927-80 Lot:: [id number]  Route: Intravitreal, Site: Left Eye  Post-op Post injection exam found visual acuity of at least counting fingers. The patient tolerated the procedure well. There were no complications. The patient received written and verbal post procedure care education. Post injection  medications were not given.                 ASSESSMENT/PLAN:  Diabetic macular edema of right eye with proliferative retinopathy associated with type 2 diabetes mellitus (HCC) Improved 2 weeks after most recent injection Avastin for CSME  Proliferative diabetic retinopathy of left eye with macular edema associated with type 2 diabetes mellitus (HCC) CSME, persistent, will repeat injection today and examination in 10 to 12 weeks      ICD-10-CM   1. Proliferative diabetic retinopathy of left eye with macular edema associated with type 2 diabetes mellitus (HCC)  EQ63.8685OCT, Retina - OU - Both Eyes    Intravitreal Injection, Pharmacologic Agent - OS - Left Eye    bevacizumab (AVASTIN) SOSY 2.5 mg  2. Diabetic macular edema of right eye with proliferative retinopathy associated with type 2 diabetes mellitus ([place]  E11.3511     1.  CSME OD improved nicely after most recent injection Avastin OD some 2 weeks previous plan is for PRP OD in approximately 8 weeks  2.  OS improved CSME from contralateral injection, yet needs injection today and follow-up around 10 to 12 weeks  3.  Ophthalmic Meds Ordered this visit:  Meds ordered this encounter  Medications  . bevacizumab (AVASTIN) SOSY 2.5 mg       Return for PRP, OD, dilate.  There are no Patient Instructions on file for this visit.   Explained the diagnoses, plan, and follow up with the patient and they expressed understanding.  Patient expressed understanding of the importance of proper follow up care.   GClent DemarkRankin M.D. Diseases & Surgery of the Retina and Vitreous Retina & Diabetic EB[place]05/05/22  Abbreviations: M myopia (nearsighted); A astigmatism; H hyperopia (farsighted); P presbyopia; Mrx spectacle prescription;  CTL contact lenses; OD right eye; OS left eye; OU both eyes  XT exotropia; ET esotropia; PEK punctate epithelial keratitis; PEE punctate epithelial erosions; DES dry eye syndrome; MGD  meibomian gland dysfunction; ATs artificial tears; PFAT's preservative free artificial tears; [place] nuclear sclerotic cataract; PSC posterior subcapsular cataract; ERM epi-retinal membrane; PVD posterior vitreous detachment; RD retinal detachment; DM diabetes mellitus; DR diabetic retinopathy; NPDR non-proliferative diabetic retinopathy; PDR proliferative diabetic retinopathy; CSME clinically significant macular edema; DME diabetic macular edema; dbh dot blot hemorrhages; CWS cotton wool spot; POAG primary open angle glaucoma; C/D cup-to-disc ratio; HVF humphrey visual field; GVF goldmann visual field; OCT optical coherence tomography; IOP intraocular pressure; BRVO Branch retinal vein occlusion; CRVO central retinal vein occlusion; CRAO central retinal artery occlusion; BRAO branch retinal artery occlusion; RT retinal tear; SB scleral buckle; PPV pars plana vitrectomy; VH Vitreous hemorrhage; PRP panretinal laser photocoagulation; IVK intravitreal kenalog; VMT vitreomacular traction; MH Macular hole;  NVD neovascularization of the disc; NVE neovascularization elsewhere; AREDS age related eye disease study; ARMD age related macular degeneration; POAG primary open angle glaucoma; EBMD epithelial/anterior basement membrane dystrophy; ACIOL anterior chamber intraocular lens; IOL intraocular lens; PCIOL posterior chamber intraocular lens; Phaco/IOL phacoemulsification with intraocular lens placement; [place] photorefractive keratectomy; LASIK laser assisted in situ keratomileusis; HTN hypertension; DM diabetes mellitus; COPD chronic obstructive pulmonary disease

## 2020-09-11 NOTE — Assessment & Plan Note (Signed)
CSME, persistent, will repeat injection today and examination in 10 to 12 weeks

## 2020-10-07 ENCOUNTER — Other Ambulatory Visit: Payer: Self-pay

## 2020-10-07 ENCOUNTER — Encounter (INDEPENDENT_AMBULATORY_CARE_PROVIDER_SITE_OTHER): Payer: Self-pay | Admitting: Ophthalmology

## 2020-10-07 ENCOUNTER — Ambulatory Visit (INDEPENDENT_AMBULATORY_CARE_PROVIDER_SITE_OTHER): Payer: Medicare Other | Admitting: Ophthalmology

## 2020-10-07 DIAGNOSIS — E113511 Type 2 diabetes mellitus with proliferative diabetic retinopathy with macular edema, right eye: Secondary | ICD-10-CM | POA: Diagnosis not present

## 2020-10-07 NOTE — Progress Notes (Signed)
10/07/2020     CHIEF COMPLAINT Patient presents for Retina Follow Up (6 week PRP OD/Pt states VA OU stable since last visit. Pt denies FOL, floaters, or ocular pain OU. /A1C:unknown/LBS:177)   HISTORY OF PRESENT ILLNESS: Shelly Jackson is a 86 y.o. female who presents to the clinic today for:   HPI    Retina Follow Up    Diagnosis: Diabetic Retinopathy   Laterality: right eye   Onset: 6 weeks ago   Severity: mild   Duration: 6 weeks   Course: gradually improving   Comments: 6 week PRP OD Pt states VA OU stable since last visit. Pt denies FOL, floaters, or ocular pain OU.  A1C:unknown LBS:177       Last edited by Kendra Opitz, COA on 10/07/2020  3:01 PM. (History)      Referring physician: Kathyrn Drown, MD [id number] [place],  [place] [id number]  HISTORICAL INFORMATION:   Selected notes from the MEDICAL RECORD NUMBER    Lab Results  Component Value Date   HGBA1C 6.5 (H) 03/11/2020     CURRENT MEDICATIONS: No current outpatient medications on file. (Ophthalmic Drugs)   No current facility-administered medications for this visit. (Ophthalmic Drugs)   Current Outpatient Medications (Other)  Medication Sig  . Accu-Chek Softclix Lancets lancets Use as instructed  . amLODipine (NORVASC) 5 MG tablet Take one tablet by mouth daily.  Marland Kitchen aspirin 81 MG tablet Take 81 mg by mouth daily.  . blood glucose meter kit and supplies Dispense based on patient and insurance preference. Use to test blood sugar 3 times a day (FOR ICD-10 E10.9, E11.9).  . citalopram (CELEXA) 20 MG tablet TAKE 1 TABLET BY MOUTH  DAILY  . glucose blood (ONE TOUCH ULTRA TEST) test strip TEST BLOOD SUGAR THREE TIMES DAILY  . glucose blood test strip Use as instructed  . insulin glargine (LANTUS SOLOSTAR) 100 UNIT/ML Solostar Pen INJECT SUBCUTANEOUSLY 48  UNITS AT BEDTIME  . Insulin Pen Needle (B-D UF III MINI PEN NEEDLES) 31G X 5 MM MISC USE AS DIRECTED  . Insulin Syringes, Disposable,  U-100 1 ML MISC Use to administer insulin  . losartan (COZAAR) 50 MG tablet TAKE 1 TABLET BY MOUTH  DAILY  . metFORMIN (GLUCOPHAGE) 1000 MG tablet TAKE 1 TABLET BY MOUTH IN  THE MORNING AND ONE-HALF  TABLET BY MOUTH AT NIGHT  . pantoprazole (PROTONIX) 40 MG tablet TAKE 1 TABLET BY MOUTH  DAILY  . potassium chloride (KLOR-CON) 10 MEQ tablet Take 1 tablet on Mondays, wednesdays and fridays.  . pravastatin (PRAVACHOL) 10 MG tablet TAKE 1 TABLET BY MOUTH  DAILY  . triamcinolone cream (KENALOG) 0.1 % Apply thin amount twice a day as needed to help with itching around the umbilicus   No current facility-administered medications for this visit. (Other)      REVIEW OF SYSTEMS:    ALLERGIES Allergies  Allergen Reactions  . Daypro [Oxaprozin]     Indigestion     PAST MEDICAL HISTORY Past Medical History:  Diagnosis Date  . Acid reflux   . Anemia    Secondary to acute blood loss  . Anxiety   . Arthritis    RA  . CHF (congestive heart failure) ([place])   . Diabetes mellitus   . Diabetic retinopathy ([place])   . Diverticulitis 07/24/11   Inpatient [facility]  . History of kidney stones   . Hypertension   . Obesity   . Rheumatoid arthritis(714.0)   .  Sleep apnea   . Thrombocytopenia ([place])   . Tubular adenoma of colon 07/24/11   Past Surgical History:  Procedure Laterality Date  . APPENDECTOMY    . CATARACT EXTRACTION Bilateral (right 08/2014) ( left 2005)  . CHOLECYSTECTOMY    . COLONOSCOPY  07/24/2011   Dr. Deatra Ina MM polyp at proximal ascending colon could not be removed because of difficult approach, pancolonic diverticulosis with diverticulitis involving the ascending colon, and suspected diverticular bleed, 7 mm polyp snared from proximal sigmoid colon with hemorrhagic surface found to be a tubular adenoma, small external hemorrhoids  . COLONOSCOPY  08/2007   Dr. Malva Limes hyperplastic polyp  . COLONOSCOPY N/A 12/14/2012   Procedure: COLONOSCOPY;  Surgeon: Daneil Dolin, MD;   Location: [facility];  Service: Endoscopy;  Laterality: N/A;  8:30  . CYSTOSCOPY/RETROGRADE/URETEROSCOPY Right 06/27/2017   Procedure: CYSTOSCOPY/RIGHT RETROGRADE PYELOGRAM/RIGHT URETEROSCOPY;  Surgeon: Cleon Gustin, MD;  Location: [facility];  Service: Urology;  Laterality: Right;  . HOLMIUM LASER APPLICATION Right 1/61/0960   Procedure: HOLMIUM LASER LITHOTRIPSY RIGHT RENAL CALCULUS;  Surgeon: Cleon Gustin, MD;  Location: [facility];  Service: Urology;  Laterality: Right;  . TONSILLECTOMY      FAMILY HISTORY Family History  Problem Relation Age of Onset  . Coronary artery disease Father   . Heart attack Father   . Cirrhosis Mother   . Hypertension Mother   . Colon cancer Neg Hx     SOCIAL HISTORY Social History   Tobacco Use  . Smoking status: Never Smoker  . Smokeless tobacco: Never Used  . Tobacco comment: Never  Vaping Use  . Vaping Use: Never used  Substance Use Topics  . Alcohol use: No  . Drug use: No         OPHTHALMIC EXAM: Base Eye Exam    Visual Acuity (ETDRS)      Right Left   Dist [place] 20/60 +2 20/200   Dist ph [place] 20/50 20/80   Correction: Glasses       Tonometry (Tonopen, 3:05 PM)      Right Left   Pressure 16 16       Pupils      Pupils Dark Light Shape React APD   Right PERRL 5 4 Round Brisk None   Left PERRL 5 4 Round Brisk None       Visual Fields (Counting fingers)      Left Right    Full Full       Extraocular Movement      Right Left    Full Full       Neuro/Psych    Oriented x3: Yes   Mood/Affect: Normal       Dilation    Right eye: 1.0% Mydriacyl, 2.5% Phenylephrine @ 3:05 PM        Slit Lamp and Fundus Exam    External Exam      Right Left   External Normal Normal       Slit Lamp Exam      Right Left   Lids/Lashes Normal Normal   Conjunctiva/Sclera White and quiet White and quiet   Cornea Clear Clear   Anterior Chamber Deep and quiet Deep and quiet   Iris Round and reactive Round and reactive   Lens  Posterior chamber intraocular lens Posterior chamber intraocular lens   Anterior Vitreous Normal Normal       Fundus Exam      Right Left   Posterior Vitreous Posterior vitreous  detachment    Disc Normal    C/D Ratio 0.25    Macula Microaneurysms, Mild clinically significant macular edema, improved    Vessels PDR-quiet    Periphery PRP,  attached, room temporally for completion of PRP, will need to decrease vegF load           IMAGING AND PROCEDURES  Imaging and Procedures for 10/07/20  Panretinal Photocoagulation - OD - Right Eye       Time Out Confirmed correct patient, procedure, site, and patient consented.   Anesthesia Topical anesthesia was used. Anesthetic medications included Proparacaine 0.5%.   Laser Information The type of laser was diode. Color was yellow. The duration in seconds was 0.02. The spot size was 391 microns. Laser power was 280. Total spots was 503.   Post-op The patient tolerated the procedure well. There were no complications. The patient received written and verbal post procedure care education.   Notes Temporal periphery treated complicated fashion.                ASSESSMENT/PLAN:  Diabetic macular edema of right eye with proliferative retinopathy associated with type 2 diabetes mellitus ([place]) Add additional PRP OD today to decrease vegF burden for the right eye      ICD-10-CM   1. Diabetic macular edema of right eye with proliferative retinopathy associated with type 2 diabetes mellitus (HCC)  P50.9326 Panretinal Photocoagulation - OD - Right Eye    1.  OD, with active PDR, retinal nonperfusion then recurrent CSME, for PRP today to decrease vegF burden  2.  Dilate OU next follow-up as scheduled July 2022  3.  Ophthalmic Meds Ordered this visit:  No orders of the defined types were placed in this encounter.      Return for As scheduled, COLOR FP, OCT, DILATE OU.  There are no Patient Instructions on file for this  visit.   Explained the diagnoses, plan, and follow up with the patient and they expressed understanding.  Patient expressed understanding of the importance of proper follow up care.   Clent Demark Ellieanna Funderburg M.D. Diseases & Surgery of the Retina and Vitreous [facility] 10/07/20     Abbreviations: M myopia (nearsighted); A astigmatism; H hyperopia (farsighted); P presbyopia; Mrx spectacle prescription;  CTL contact lenses; OD right eye; OS left eye; OU both eyes  XT exotropia; ET esotropia; PEK punctate epithelial keratitis; PEE punctate epithelial erosions; DES dry eye syndrome; MGD meibomian gland dysfunction; ATs artificial tears; PFAT's preservative free artificial tears; [place] nuclear sclerotic cataract; PSC posterior subcapsular cataract; ERM epi-retinal membrane; PVD posterior vitreous detachment; RD retinal detachment; DM diabetes mellitus; DR diabetic retinopathy; NPDR non-proliferative diabetic retinopathy; PDR proliferative diabetic retinopathy; CSME clinically significant macular edema; DME diabetic macular edema; dbh dot blot hemorrhages; CWS cotton wool spot; POAG primary open angle glaucoma; C/D cup-to-disc ratio; HVF humphrey visual field; GVF goldmann visual field; OCT optical coherence tomography; IOP intraocular pressure; BRVO Branch retinal vein occlusion; CRVO central retinal vein occlusion; CRAO central retinal artery occlusion; BRAO branch retinal artery occlusion; RT retinal tear; SB scleral buckle; PPV pars plana vitrectomy; VH Vitreous hemorrhage; PRP panretinal laser photocoagulation; IVK intravitreal kenalog; VMT vitreomacular traction; MH Macular hole;  NVD neovascularization of the disc; NVE neovascularization elsewhere; AREDS age related eye disease study; ARMD age related macular degeneration; POAG primary open angle glaucoma; EBMD epithelial/anterior basement membrane dystrophy; ACIOL anterior chamber intraocular lens; IOL intraocular lens; PCIOL posterior chamber  intraocular lens; Phaco/IOL phacoemulsification with intraocular lens placement;  [place] photorefractive keratectomy; LASIK laser assisted in situ keratomileusis; HTN hypertension; DM diabetes mellitus; COPD chronic obstructive pulmonary disease

## 2020-10-07 NOTE — Assessment & Plan Note (Signed)
Add additional PRP OD today to decrease vegF burden for the right eye

## 2020-10-22 NOTE — Telephone Encounter (Signed)
2nd time calling no answer

## 2020-10-22 NOTE — Telephone Encounter (Signed)
May have 3 months worth please schedule her a follow-up office visit

## 2020-11-12 ENCOUNTER — Other Ambulatory Visit: Payer: Self-pay | Admitting: Family Medicine

## 2020-11-13 ENCOUNTER — Other Ambulatory Visit: Payer: Self-pay | Admitting: Family Medicine

## 2020-11-17 ENCOUNTER — Other Ambulatory Visit: Payer: Self-pay

## 2020-11-17 ENCOUNTER — Ambulatory Visit (INDEPENDENT_AMBULATORY_CARE_PROVIDER_SITE_OTHER): Payer: Medicare Other | Admitting: Family Medicine

## 2020-11-17 ENCOUNTER — Encounter: Payer: Self-pay | Admitting: Family Medicine

## 2020-11-17 VITALS — BP 158/86 | HR 64 | Temp 97.9°F | Ht 62.0 in | Wt 202.0 lb

## 2020-11-17 DIAGNOSIS — E113511 Type 2 diabetes mellitus with proliferative diabetic retinopathy with macular edema, right eye: Secondary | ICD-10-CM | POA: Diagnosis not present

## 2020-11-17 DIAGNOSIS — I10 Essential (primary) hypertension: Secondary | ICD-10-CM | POA: Diagnosis not present

## 2020-11-17 DIAGNOSIS — E119 Type 2 diabetes mellitus without complications: Secondary | ICD-10-CM

## 2020-11-17 DIAGNOSIS — E876 Hypokalemia: Secondary | ICD-10-CM | POA: Diagnosis not present

## 2020-11-17 DIAGNOSIS — E1142 Type 2 diabetes mellitus with diabetic polyneuropathy: Secondary | ICD-10-CM

## 2020-11-17 DIAGNOSIS — F439 Reaction to severe stress, unspecified: Secondary | ICD-10-CM

## 2020-11-17 MED ORDER — PANTOPRAZOLE SODIUM 40 MG PO TBEC: 40.0000 mg | DELAYED_RELEASE_TABLET | Freq: Every day | ORAL | 1 refills | Status: DC

## 2020-11-17 MED ORDER — POTASSIUM CHLORIDE ER 10 MEQ PO TBCR: EXTENDED_RELEASE_TABLET | ORAL | 1 refills | Status: DC

## 2020-11-17 MED ORDER — CITALOPRAM HYDROBROMIDE 20 MG PO TABS: 20.0000 mg | ORAL_TABLET | Freq: Every day | ORAL | 1 refills | Status: DC

## 2020-11-17 MED ORDER — LOSARTAN POTASSIUM 50 MG PO TABS: 50.0000 mg | ORAL_TABLET | Freq: Every day | ORAL | 1 refills | Status: DC

## 2020-11-17 MED ORDER — METFORMIN HCL 1000 MG PO TABS: ORAL_TABLET | ORAL | 1 refills | Status: DC

## 2020-11-17 MED ORDER — PRAVASTATIN SODIUM 10 MG PO TABS: 10.0000 mg | ORAL_TABLET | Freq: Every day | ORAL | 1 refills | Status: DC

## 2020-11-17 MED ORDER — BD PEN NEEDLE MINI U/F 31G X 5 MM MISC: 1 refills | Status: DC

## 2020-11-17 MED ORDER — AMLODIPINE BESYLATE 5 MG PO TABS: ORAL_TABLET | ORAL | 1 refills | Status: DC

## 2020-11-17 NOTE — Progress Notes (Signed)
   Subjective:    Patient ID: Shelly Jackson, female    DOB: 1934-12-02, 86 y.o.   MRN: [id number]  Diabetes She presents for her follow-up diabetic visit. She has type 2 diabetes mellitus. Current diabetic treatments: metformin, lantus. She is compliant with treatment all of the time. Eye exam current: goes tomorrow for eye exam.   Pt states she wants to discuss stress and anxiety. Has son living with her that got out of prison 3 years and they are not getting along.   Needs rx for lantus solostar pen needles.  Results for orders placed or performed in visit on 07/15/20  Comprehensive Metabolic Panel (CMET)  Result Value Ref Range   Glucose 85 65 - 99 mg/dL   BUN 14 8 - 27 mg/dL   Creatinine, Ser 0.84 0.57 - 1.00 mg/dL   eGFR 69 >59 mL/min/1.73   BUN/Creatinine Ratio 17 12 - 28   Sodium 144 134 - 144 mmol/L   Potassium 4.1 3.5 - 5.2 mmol/L   Chloride 105 96 - 106 mmol/L   CO2 24 20 - 29 mmol/L   Calcium 9.0 8.7 - 10.3 mg/dL   Total Protein 6.7 6.0 - 8.5 g/dL   Albumin 4.3 3.6 - 4.6 g/dL   Globulin, Total 2.4 1.5 - 4.5 g/dL   Albumin/Globulin Ratio 1.8 1.2 - 2.2   Bilirubin Total 0.6 0.0 - 1.2 mg/dL   Alkaline Phosphatase 88 44 - 121 IU/L   AST 14 0 - 40 IU/L   ALT 12 0 - 32 IU/L    Review of Systems     Objective:   Physical Exam General-in no acute distress Eyes-no discharge Lungs-respiratory rate normal, CTA CV-no murmurs,RRR Extremities skin warm dry no edema Neuro grossly normal Behavior normal, alert        Assessment & Plan:   1. Diabetic macular edema of right eye with proliferative retinopathy associated with type 2 diabetes mellitus ([place]) Patient will work hard on healthy diet regular activity see her eye specialist on a regular basis.  Patient is still able to drive but I recommended daytime driving careful to avoid busy situations - Basic metabolic panel - Hemoglobin A1c  2. Elevated blood pressure reading with diagnosis of hypertension Blood  pressure decent control continue ongoing medications.  Number commend she is taking it on a regular basis she is to bring her blood pressure 1 medicines and all other medicines with her in 3 months - amLODipine (NORVASC) 5 MG tablet; Take one tablet by mouth daily.  Dispense: 30 tablet; Refill: 1  3. Hypokalemia Check kidney function and potassium before next visit take potassium only 3 days a week - potassium chloride (KLOR-CON) 10 MEQ tablet; Take 1 tablet on Mondays, wednesdays and fridays.  Dispense: 36 tablet; Refill: 1  4. Diabetes mellitus without complication (HCC) Diabetes check urine micro protein - Microalbumin / creatinine urine ratio  5. Diabetic peripheral neuropathy (HCC) [place] on feet sees podiatry no ulcers noted  6. Stress at home Patient is on antidepressant I recommend avoiding nerve medication

## 2020-11-18 ENCOUNTER — Encounter: Payer: Self-pay | Admitting: Family Medicine

## 2020-11-18 LAB — MICROALBUMIN / CREATININE URINE RATIO
Creatinine, Urine: 51.3 mg/dL
Microalb/Creat Ratio: 118 mg/g creat — ABNORMAL HIGH (ref 0–29)
Microalbumin, Urine: 60.3 ug/mL

## 2020-11-18 LAB — BASIC METABOLIC PANEL
BUN/Creatinine Ratio: 19 (ref 12–28)
BUN: 13 mg/dL (ref 8–27)
CO2: 25 mmol/L (ref 20–29)
Calcium: 8.8 mg/dL (ref 8.7–10.3)
Chloride: 103 mmol/L (ref 96–106)
Creatinine, Ser: 0.68 mg/dL (ref 0.57–1.00)
Glucose: 65 mg/dL (ref 65–99)
Potassium: 3.8 mmol/L (ref 3.5–5.2)
Sodium: 144 mmol/L (ref 134–144)
eGFR: 86 mL/min/{1.73_m2} (ref >59)

## 2020-11-18 LAB — HEMOGLOBIN A1C
Est. average glucose Bld gHb Est-mCnc: 134 mg/dL
Hgb A1c MFr Bld: 6.3 % — ABNORMAL HIGH (ref 4.8–5.6)

## 2020-11-18 NOTE — Telephone Encounter (Signed)
May refuse these I did send in refills yesterday

## 2020-11-19 ENCOUNTER — Telehealth: Payer: Self-pay | Admitting: *Deleted

## 2020-11-19 NOTE — Telephone Encounter (Signed)
Called pt with results and she said the new med that dr scott was going to send in was not at pharm. I asked her the name of the med and she did not know and I then asked if she knew what the med was suppose to be for and she said maybe her sugar but she didn't know for sure. I looked at med list and did not see any med that was sent yesterday and I read note and saw med change was metformin and read her directions and she said she had been taking it that way one in am and one half at night. She said maybe she just misunderstood dr Nicki Reaper but I told her I would send back a note to make sure.

## 2020-11-20 ENCOUNTER — Ambulatory Visit (INDEPENDENT_AMBULATORY_CARE_PROVIDER_SITE_OTHER): Payer: Medicare Other | Admitting: Ophthalmology

## 2020-11-20 ENCOUNTER — Other Ambulatory Visit: Payer: Self-pay

## 2020-11-20 ENCOUNTER — Encounter (INDEPENDENT_AMBULATORY_CARE_PROVIDER_SITE_OTHER): Payer: Self-pay | Admitting: Ophthalmology

## 2020-11-20 DIAGNOSIS — E113511 Type 2 diabetes mellitus with proliferative diabetic retinopathy with macular edema, right eye: Secondary | ICD-10-CM

## 2020-11-20 DIAGNOSIS — E113512 Type 2 diabetes mellitus with proliferative diabetic retinopathy with macular edema, left eye: Secondary | ICD-10-CM

## 2020-11-20 NOTE — Assessment & Plan Note (Signed)
PDR OU quiet.  We will continue to monitor right eye for center involved CSME recurrence.Better acuity today thus will observe

## 2020-11-20 NOTE — Telephone Encounter (Signed)
When she was here the other day I was concerned that she was not taking her medicines for her blood pressure on a regular basis.  We reviewed over those and make sure she had refills.  I told her she would need to follow-up within 3 months to make sure her blood pressure is doing well  If she would like to follow-up sooner we could see her back in 3 to 4 weeks to recheck blood pressure  As for the metformin given the information that she provided I would recommend reducing the metformin to a half a tablet in the morning and a half a tablet in the evening of the 1000 mg Please make sure that the medication list in epic reflects this And the patient can start doing 1/2 tablet twice daily of the 1000 mg Thank you  Also should be noted she was under a lot of stress with her son and she had requested medication to help with this I told her the Celexa should help and I told her that I did not recommend nerve pills for her because of risk of drowsiness

## 2020-11-20 NOTE — Telephone Encounter (Addendum)
Patient notified and verbalized understanding and scheduled blood pressure follow up with Dr Nicki Reaper in 4 weeks. Medication updated in Epic.

## 2020-11-20 NOTE — Progress Notes (Signed)
11/20/2020     CHIEF COMPLAINT Patient presents for Retina Follow Up (10 week fu ou and OCT/FP/Pt states VA OU stable since last visit. Pt denies FOL, floaters, or ocular pain OU. Karie Mainland: 6.3/LBS: 193/)   HISTORY OF PRESENT ILLNESS: Shelly Jackson is a 86 y.o. female who presents to the clinic today for:   HPI     Retina Follow Up           Diagnosis: Diabetic Retinopathy   Laterality: both eyes   Onset: 10 weeks ago   Severity: mild   Duration: 10 weeks   Course: stable   Comments: 10 week fu ou and OCT/FP Pt states VA OU stable since last visit. Pt denies FOL, floaters, or ocular pain OU.  A1C: 6.3 LBS: 193        Last edited by Kendra Opitz, COA on 11/20/2020  1:25 PM.      Referring physician: Kathyrn Drown, MD [address] [place],  [place] [address]  HISTORICAL INFORMATION:   Selected notes from the MEDICAL RECORD NUMBER    Lab Results  Component Value Date   HGBA1C 6.3 (H) 11/17/2020     CURRENT MEDICATIONS: No current outpatient medications on file. (Ophthalmic Drugs)   No current facility-administered medications for this visit. (Ophthalmic Drugs)   Current Outpatient Medications (Other)  Medication Sig   Accu-Chek Softclix Lancets lancets Use as instructed   amLODipine (NORVASC) 5 MG tablet Take one tablet by mouth daily.   aspirin 81 MG tablet Take 81 mg by mouth daily.   blood glucose meter kit and supplies Dispense based on patient and insurance preference. Use to test blood sugar 3 times a day (FOR ICD-10 E10.9, E11.9).   citalopram (CELEXA) 20 MG tablet Take 1 tablet (20 mg total) by mouth daily.   glucose blood (ONE TOUCH ULTRA TEST) test strip TEST BLOOD SUGAR THREE TIMES DAILY   glucose blood test strip Use as instructed   insulin glargine (LANTUS SOLOSTAR) 100 UNIT/ML Solostar Pen INJECT SUBCUTANEOUSLY 48  UNITS AT BEDTIME   Insulin Pen Needle (B-D UF III MINI PEN NEEDLES) 31G X 5 MM MISC USE TO ADMINISTER INSULIN  AS  DIRECTED   Insulin Syringes, Disposable, U-100 1 ML MISC Use to administer insulin   losartan (COZAAR) 50 MG tablet Take 1 tablet (50 mg total) by mouth daily.   metFORMIN (GLUCOPHAGE) 1000 MG tablet Take one tablet in the morning and one half tablet at night   pantoprazole (PROTONIX) 40 MG tablet Take 1 tablet (40 mg total) by mouth daily.   potassium chloride (KLOR-CON) 10 MEQ tablet Take 1 tablet on Mondays, wednesdays and fridays.   pravastatin (PRAVACHOL) 10 MG tablet Take 1 tablet (10 mg total) by mouth daily.   triamcinolone cream (KENALOG) 0.1 % Apply thin amount twice a day as needed to help with itching around the umbilicus   No current facility-administered medications for this visit. (Other)      REVIEW OF SYSTEMS:    ALLERGIES Allergies  Allergen Reactions   Daypro [Oxaprozin]     Indigestion     PAST MEDICAL HISTORY Past Medical History:  Diagnosis Date   Acid reflux    Anemia    Secondary to acute blood loss   Anxiety    Arthritis    RA   CHF (congestive heart failure) (HCC)    Diabetes mellitus    Diabetic retinopathy ([place])    Diverticulitis 07/24/11   Inpatient  [facility]   History of kidney stones    Hypertension    Obesity    Rheumatoid arthritis(714.0)    Sleep apnea    Thrombocytopenia (HCC)    Tubular adenoma of colon 07/24/11   Past Surgical History:  Procedure Laterality Date   APPENDECTOMY     CATARACT EXTRACTION Bilateral (right 08/2014) ( left 2005)   CHOLECYSTECTOMY     COLONOSCOPY  07/24/2011   Dr. Deatra Ina MM polyp at proximal ascending colon could not be removed because of difficult approach, pancolonic diverticulosis with diverticulitis involving the ascending colon, and suspected diverticular bleed, 7 mm polyp snared from proximal sigmoid colon with hemorrhagic surface found to be a tubular adenoma, small external hemorrhoids   COLONOSCOPY  08/2007   Dr. Malva Limes hyperplastic polyp   COLONOSCOPY N/A 12/14/2012   Procedure: COLONOSCOPY;   Surgeon: Daneil Dolin, MD;  [facility]: [facility];  Service: Endoscopy;  Laterality: N/A;  8:30   CYSTOSCOPY/RETROGRADE/URETEROSCOPY Right 06/27/2017   Procedure: CYSTOSCOPY/RIGHT RETROGRADE PYELOGRAM/RIGHT URETEROSCOPY;  Surgeon: Cleon Gustin, MD;  Location: [facility];  Service: Urology;  Laterality: Right;   HOLMIUM LASER APPLICATION Right 3/54/5625   Procedure: HOLMIUM LASER LITHOTRIPSY RIGHT RENAL CALCULUS;  Surgeon: Cleon Gustin, MD;  Location: [facility];  Service: Urology;  Laterality: Right;   TONSILLECTOMY      FAMILY HISTORY Family History  Problem Relation Age of Onset   Coronary artery disease Father    Heart attack Father    Cirrhosis Mother    Hypertension Mother    Colon cancer Neg Hx     SOCIAL HISTORY Social History   Tobacco Use   Smoking status: Never   Smokeless tobacco: Never   Tobacco comments:    Never  Vaping Use   Vaping Use: Never used  Substance Use Topics   Alcohol use: No   Drug use: No         OPHTHALMIC EXAM:  Base Eye Exam     Visual Acuity (ETDRS)       Right Left   Dist cc 20/40 20/100   Dist ph cc NI 20/80    Correction: Glasses         Tonometry (Tonopen, 1:29 PM)       Right Left   Pressure 17 18         Pupils       Pupils Dark Light Shape React APD   Right PERRL 5 4 Round Brisk None   Left PERRL 5 4 Round Brisk None         Visual Fields (Counting fingers)       Left Right    Full Full         Extraocular Movement       Right Left    Full Full         Neuro/Psych     Oriented x3: Yes   Mood/Affect: Normal         Dilation     Both eyes: 1.0% Mydriacyl, 2.5% Phenylephrine @ 1:29 PM           Slit Lamp and Fundus Exam     External Exam       Right Left   External Normal Normal         Slit Lamp Exam       Right Left   Lids/Lashes Normal Normal   Conjunctiva/Sclera White and quiet White and quiet   Cornea Clear Clear   Anterior Chamber Deep  and quiet Deep  and quiet   Iris Round and reactive Round and reactive   Lens Posterior chamber intraocular lens Posterior chamber intraocular lens   Anterior Vitreous Normal Normal         Fundus Exam       Right Left   Posterior Vitreous Posterior vitreous detachment Posterior vitreous detachment   Disc Normal Normal   C/D Ratio 0.25 0.3   Macula Microaneurysms, Mild clinically significant macular edema, improved Microaneurysms, Geographic atrophy, Mild clinically significant macular edema   Vessels PDR-quiet PDR-quiet   Periphery PRP,  attached, room temporally for completion of PRP, will need to decrease vegF load PRP,  attached,            IMAGING AND PROCEDURES  Imaging and Procedures for 11/20/20  OCT, Retina - OU - Both Eyes       Right Eye Quality was good. Scan locations included subfoveal. Central Foveal Thickness: 355. Progression has improved. Findings include abnormal foveal contour.   Left Eye Quality was good. Scan locations included subfoveal. Central Foveal Thickness: 305. Progression has been stable. Findings include abnormal foveal contour, cystoid macular edema.   Notes Center involved CSME improved OD  ,   OS with center involved CSME improved over the last      Color Fundus Photography Optos - OU - Both Eyes       Right Eye Progression has been stable. Disc findings include normal observations. Macula : edema.   Left Eye Progression has been stable. Disc findings include normal observations. Macula : edema.   Notes Bilateral proliferative diabetic retinopathy  , good peripheral PRP now OU ,PDR involutional             ASSESSMENT/PLAN:  Diabetic macular edema of right eye with proliferative retinopathy associated with type 2 diabetes mellitus ([place]) PDR OU quiet.  We will continue to monitor right eye for center involved CSME recurrence.Better acuity today thus will observe  Proliferative diabetic retinopathy of left eye with macular edema  associated with type 2 diabetes mellitus (HCC) OS overall stable     ICD-10-CM   1. Diabetic macular edema of right eye with proliferative retinopathy associated with type 2 diabetes mellitus (HCC)  E11.3511 OCT, Retina - OU - Both Eyes    Color Fundus Photography Optos - OU - Both Eyes    2. Proliferative diabetic retinopathy of left eye with macular edema associated with type 2 diabetes mellitus (HCC)  Y18.5631 OCT, Retina - OU - Both Eyes    Color Fundus Photography Optos - OU - Both Eyes      1.  Overall CSME improved and stable we will continue to monitor and observe.  2.  3.  Ophthalmic Meds Ordered this visit:  No orders of the defined types were placed in this encounter.      Return in about 10 weeks (around 01/29/2021) for DILATE OU, OCT, OPTOS FFA R/L, COLOR FP.  There are no Patient Instructions on file for this visit.   Explained the diagnoses, plan, and follow up with the patient and they expressed understanding.  Patient expressed understanding of the importance of proper follow up care.   Clent Demark Lashay Osborne M.D. Diseases & Surgery of the Retina and Vitreous [facility]ina [facility] Diabetic [place] 11/20/20     Abbreviations: M myopia (nearsighted); A astigmatism; H hyperopia (farsighted); P presbyopia; Mrx spectacle prescription;  CTL contact lenses; OD right eye; OS left eye; OU both eyes  XT exotropia; ET esotropia; PEK  punctate epithelial keratitis; PEE punctate epithelial erosions; DES dry eye syndrome; MGD meibomian gland dysfunction; ATs artificial tears; PFAT's preservative free artificial tears; [place] nuclear sclerotic cataract; PSC posterior subcapsular cataract; ERM epi-retinal membrane; PVD posterior vitreous detachment; RD retinal detachment; DM diabetes mellitus; DR diabetic retinopathy; NPDR non-proliferative diabetic retinopathy; PDR proliferative diabetic retinopathy; CSME clinically significant macular edema; DME diabetic macular edema; dbh dot blot hemorrhages;  CWS cotton wool spot; POAG primary open angle glaucoma; C/D cup-to-disc ratio; HVF humphrey visual field; GVF goldmann visual field; OCT optical coherence tomography; IOP intraocular pressure; BRVO Branch retinal vein occlusion; CRVO central retinal vein occlusion; CRAO central retinal artery occlusion; BRAO branch retinal artery occlusion; RT retinal tear; SB scleral buckle; PPV pars plana vitrectomy; VH Vitreous hemorrhage; PRP panretinal laser photocoagulation; IVK intravitreal kenalog; VMT vitreomacular traction; MH Macular hole;  NVD neovascularization of the disc; NVE neovascularization elsewhere; AREDS age related eye disease study; ARMD age related macular degeneration; POAG primary open angle glaucoma; EBMD epithelial/anterior basement membrane dystrophy; ACIOL anterior chamber intraocular lens; IOL intraocular lens; PCIOL posterior chamber intraocular lens; Phaco/IOL phacoemulsification with intraocular lens placement; [place] photorefractive keratectomy; LASIK laser assisted in situ keratomileusis; HTN hypertension; DM diabetes mellitus; COPD chronic obstructive pulmonary disease

## 2020-11-20 NOTE — Assessment & Plan Note (Signed)
OS overall stable

## 2020-12-18 ENCOUNTER — Ambulatory Visit: Payer: Medicare Other | Admitting: Family Medicine

## 2020-12-24 ENCOUNTER — Other Ambulatory Visit: Payer: Self-pay | Admitting: Family Medicine

## 2020-12-31 ENCOUNTER — Other Ambulatory Visit: Payer: Self-pay | Admitting: Family Medicine

## 2020-12-31 DIAGNOSIS — I10 Essential (primary) hypertension: Secondary | ICD-10-CM

## 2021-01-19 ENCOUNTER — Telehealth: Payer: Self-pay | Admitting: Family Medicine

## 2021-01-19 NOTE — Telephone Encounter (Signed)
Patient states she has a rash around her belt area and under her breast. She states that it has been there for about a week and she can't get it to go away.  CB# [phone]

## 2021-01-19 NOTE — Telephone Encounter (Signed)
Contacted pt and advised that we would need to see her; offered pt an appt on 01/21/21 but pt declined; states she can not wait that long. Advised pt she would need to go to Urgent Care. Pt verbalized understanding.

## 2021-01-20 ENCOUNTER — Other Ambulatory Visit: Payer: Self-pay | Admitting: Family Medicine

## 2021-02-02 ENCOUNTER — Encounter (INDEPENDENT_AMBULATORY_CARE_PROVIDER_SITE_OTHER): Payer: Medicare Other | Admitting: Ophthalmology

## 2021-02-16 ENCOUNTER — Encounter (INDEPENDENT_AMBULATORY_CARE_PROVIDER_SITE_OTHER): Payer: Medicare Other | Admitting: Ophthalmology

## 2021-02-17 ENCOUNTER — Encounter: Payer: Self-pay | Admitting: Family Medicine

## 2021-02-17 ENCOUNTER — Other Ambulatory Visit: Payer: Self-pay

## 2021-02-17 ENCOUNTER — Ambulatory Visit (INDEPENDENT_AMBULATORY_CARE_PROVIDER_SITE_OTHER): Payer: Medicare Other | Admitting: Family Medicine

## 2021-02-17 VITALS — BP 122/74 | Temp 98.1°F | Wt 201.2 lb

## 2021-02-17 DIAGNOSIS — Z23 Encounter for immunization: Secondary | ICD-10-CM

## 2021-02-17 DIAGNOSIS — I10 Essential (primary) hypertension: Secondary | ICD-10-CM

## 2021-02-17 DIAGNOSIS — E133399 Other specified diabetes mellitus with moderate nonproliferative diabetic retinopathy without macular edema, unspecified eye: Secondary | ICD-10-CM | POA: Diagnosis not present

## 2021-02-17 DIAGNOSIS — R296 Repeated falls: Secondary | ICD-10-CM

## 2021-02-17 DIAGNOSIS — E1142 Type 2 diabetes mellitus with diabetic polyneuropathy: Secondary | ICD-10-CM

## 2021-02-17 NOTE — Progress Notes (Signed)
   Subjective:    Patient ID: Shelly Jackson, female    DOB: April 09, 1935, 86 y.o.   MRN: [id number]  HPI Pt here for follow up on DM and HTN. Pt checking sugars once per day. Pt checking blood pressure at home. Pt states she continues to fall all the time; about once a month. Pt doesn't see foot doctor but does she eye doctor.   Diabetic foot exam has neuropathy has severe bunions no ulcers seen  Need for vaccination - Plan: Flu Vaccine QUAD High Dose(Fluad)  Primary hypertension  Diabetic peripheral neuropathy (HCC)  Moderate nonproliferative diabetic retinopathy without macular edema associated with diabetes mellitus of other type, unspecified laterality ([place])  Frequent falls  Patient relates that she is having intermittent falls for no good reason she is using a walker when she gets around  Patient does relate she is able to fix her food and able to do simple driving Review of Systems     Objective:   Physical Exam  General-in no acute distress Eyes-no discharge Lungs-respiratory rate normal, CTA CV-no murmurs,RRR Extremities skin warm dry no edema Neuro grossly normal Behavior normal, alert       Assessment & Plan:  1. Need for vaccination Today - Flu Vaccine QUAD High Dose(Fluad)  2. Primary hypertension Blood pressure good control continue current measures blood pressure does not drop with standing  3. Diabetic peripheral neuropathy (HCC) Moderate neuropathy issues patient was encouraged to see podiatry  4. Moderate nonproliferative diabetic retinopathy without macular edema associated with diabetes mellitus of other type, unspecified laterality ([place]) She follows with an eye specialist  5. Frequent falls She needs to use her walker all the time  She is uncertain of her medicines we reviewed them she was still uncertain She was encouraged to get her medicines at home and we would call her in the next couple days to go over the medicines

## 2021-02-19 NOTE — Progress Notes (Signed)
02/19/21-contact pt. Pt states right now she is taking Citalopram 20 mg daily, Metformin 1000 mg one in the morning and 1/2 at night; Amlodipine 2.5mg  daily and Potassium 10 mEq daily. Pt states she has 2 bags of meds that she is not sure if she is supposed to be taking. Has on hand Amlodipine 5 mg, Pantoprazole 40 mg, Losartan 50 mg, Pravastatin 10 mg.  Pt states she has used the Triamcinolone cream up.  Please advise. Thank you  (Amlodipine 2.5 mg was prescribed on 01/01/21 and Amlodipine 5 mg 11/17/20)

## 2021-02-23 NOTE — Progress Notes (Signed)
Left message to return call 

## 2021-02-24 NOTE — Progress Notes (Signed)
Pt has appt set up for 03/10/21 and was informed to bring all bottles of med

## 2021-03-10 ENCOUNTER — Other Ambulatory Visit: Payer: Self-pay

## 2021-03-10 ENCOUNTER — Ambulatory Visit (INDEPENDENT_AMBULATORY_CARE_PROVIDER_SITE_OTHER): Payer: Medicare Other | Admitting: Family Medicine

## 2021-03-10 ENCOUNTER — Encounter: Payer: Self-pay | Admitting: Family Medicine

## 2021-03-10 VITALS — BP 142/74 | HR 80 | Temp 99.1°F | Ht 62.0 in | Wt 202.0 lb

## 2021-03-10 DIAGNOSIS — I10 Essential (primary) hypertension: Secondary | ICD-10-CM

## 2021-03-10 DIAGNOSIS — E1142 Type 2 diabetes mellitus with diabetic polyneuropathy: Secondary | ICD-10-CM

## 2021-03-10 NOTE — Progress Notes (Signed)
   Subjective:    Patient ID: NEJLA REASOR, female    DOB: 05-Aug-1934, 86 y.o.   MRN: [id number]  HPI  Hypertension - medication check  The purpose of today's visit was to spend time talking with her regarding her medications.  Also to make sure she understood her medical goal regimen.  Discuss her diabetes and blood pressure She does try to eat as healthy as possible but does relate that is very difficult with living by herself her son's home sometimes. She denies any low sugars.  We did review over her medications and made sure she had refills  Review of Systems     Objective:   Physical Exam General-in no acute distress Eyes-no discharge Lungs-respiratory rate normal, CTA CV-no murmurs,RRR Extremities skin warm dry no edema Neuro grossly normal Behavior normal, alert        Assessment & Plan:  Blood pressure decent control for her age will not adjust medicines at this point healthy diet regular activity recommended follow-up in 4 months lab work to be done for diabetes and kidney function.  Continue current measures

## 2021-03-11 LAB — BASIC METABOLIC PANEL
BUN/Creatinine Ratio: 19 (ref 12–28)
BUN: 13 mg/dL (ref 8–27)
CO2: 22 mmol/L (ref 20–29)
Calcium: 8.6 mg/dL — ABNORMAL LOW (ref 8.7–10.3)
Chloride: 105 mmol/L (ref 96–106)
Creatinine, Ser: 0.67 mg/dL (ref 0.57–1.00)
Glucose: 80 mg/dL (ref 70–99)
Potassium: 3.9 mmol/L (ref 3.5–5.2)
Sodium: 146 mmol/L — ABNORMAL HIGH (ref 134–144)
eGFR: 86 mL/min/{1.73_m2} (ref >59)

## 2021-03-11 LAB — HEMOGLOBIN A1C
Est. average glucose Bld gHb Est-mCnc: 131 mg/dL
Hgb A1c MFr Bld: 6.2 % — ABNORMAL HIGH (ref 4.8–5.6)

## 2021-03-13 ENCOUNTER — Telehealth: Payer: Self-pay | Admitting: Family Medicine

## 2021-03-13 ENCOUNTER — Other Ambulatory Visit: Payer: Self-pay | Admitting: Nurse Practitioner

## 2021-03-13 MED ORDER — BUSPIRONE HCL 5 MG PO TABS: 5.0000 mg | ORAL_TABLET | Freq: Two times a day (BID) | ORAL | 0 refills | Status: DC

## 2021-03-13 NOTE — Progress Notes (Signed)
See message about anxiety. Consulted with [employer]. Will send in Buspar 5 mg BID with possible titration to 10 mg if needed.

## 2021-03-13 NOTE — Telephone Encounter (Signed)
Please advise. Thank you

## 2021-03-13 NOTE — Telephone Encounter (Signed)
Patient left vm yesterday requesting medication to help calm her nerves. She states they picked her son up yesterday and arrested him.  CB# [phone]

## 2021-03-13 NOTE — Telephone Encounter (Signed)
Patient stated her son was arrested last night and is in jail. She is extremely upset and needs something for her nerves. She is alone as well. Please advise.  [employer]  CB#  [phone]

## 2021-03-15 NOTE — Telephone Encounter (Signed)
Fyi-Shelly Jackson  sent in BuSpar

## 2021-03-16 NOTE — Telephone Encounter (Signed)
Pt was contacted by [place] on Friday

## 2021-03-20 ENCOUNTER — Other Ambulatory Visit: Payer: Self-pay | Admitting: Family Medicine

## 2021-03-31 ENCOUNTER — Telehealth: Payer: Self-pay | Admitting: Family Medicine

## 2021-03-31 NOTE — Telephone Encounter (Signed)
No answer unable to leave a  message for patient to call back and schedule Medicare Annual Wellness Visit (AWV) in office.   If unable to come into the office for AWV,  please offer to do virtually or by telephone.  No hx of AWV eligible for AWVI as of 05/10/2009 per [facility]  Please schedule at anytime with [facility]-Nurse Health Advisor.      40 Minutes appointment   Any questions, please call me at [phone]

## 2021-04-14 ENCOUNTER — Other Ambulatory Visit: Payer: Self-pay

## 2021-04-14 DIAGNOSIS — E876 Hypokalemia: Secondary | ICD-10-CM

## 2021-04-14 MED ORDER — POTASSIUM CHLORIDE ER 10 MEQ PO TBCR
EXTENDED_RELEASE_TABLET | ORAL | 1 refills | Status: DC
Start: 2021-04-14 — End: 2021-06-15

## 2021-05-11 ENCOUNTER — Other Ambulatory Visit: Payer: Self-pay | Admitting: Nurse Practitioner

## 2021-05-23 ENCOUNTER — Other Ambulatory Visit: Payer: Self-pay | Admitting: Nurse Practitioner

## 2021-05-27 ENCOUNTER — Telehealth: Payer: Self-pay | Admitting: Family Medicine

## 2021-05-27 NOTE — Telephone Encounter (Signed)
(  Hedy Camara) call on patient to let you know she is unable to drive now and he is wanting some help in the home. He is requesting a referral for home health to come in. Please advise [phone]

## 2021-05-27 NOTE — Telephone Encounter (Signed)
Recommend home health referral-diagnosis frequent falls, leg weakness, diabetes Recommend chronic care management via [place] worker consult via [facility] to see what type of help the system/government/insurance companies can provide Family should be aware that unfortunately home health has its own set of government rules which can often make it very difficult to get help but we will try to get help as best we can and have social worker consult them We also recommend chronic care management through the office if she is not already on this it will include a phone call to gain permission plus phone call from the chronic care nurse as well as social worker

## 2021-05-28 ENCOUNTER — Other Ambulatory Visit: Payer: Self-pay

## 2021-05-28 DIAGNOSIS — E1142 Type 2 diabetes mellitus with diabetic polyneuropathy: Secondary | ICD-10-CM

## 2021-05-29 ENCOUNTER — Telehealth: Payer: Self-pay | Admitting: *Deleted

## 2021-05-29 NOTE — Chronic Care Management (AMB) (Signed)
°  Chronic Care Management   Outreach Note  05/29/2021 Name: [name] MRN: [id number] DOB: 23-Oct-1934  Shelly Jackson is a 87 y.o. year old female who is a primary care patient of Luking, Elayne Snare, MD. I reached out to [name] by phone today in response to a referral sent by [name] primary care provider.  An unsuccessful telephone outreach was attempted today. The patient was referred to the case management team for assistance with care management and care coordination.   Follow Up Plan: A HIPAA compliant phone message was left for the patient providing contact information and requesting a return call.  The care management team will reach out to the patient again over the next 7 days.  If patient returns call to provider office, please advise to call [place] * at [phone].*  [facility] Management  Direct Dial: [phone]

## 2021-06-02 NOTE — Telephone Encounter (Signed)
Patient has been informed per provider notes-- Recommend home health referral-diagnosis frequent falls, leg weakness, diabetes Recommend chronic care management via [place] worker consult via [facility] to see what type of help the system/government/insurance companies can provide Family should be aware that unfortunately home health has its own set of government rules which can often make it very difficult to get help but we will try to get help as best we can and have social worker consult them We also recommend chronic care management through the office if she is not already on this it will include a phone call to gain permission plus phone call from the chronic care nurse as well as social worker

## 2021-06-03 NOTE — Chronic Care Management (AMB) (Signed)
Chronic Care Management   Note  06/03/2021 Name: Shelly Jackson MRN: [id number] DOB: 1935/04/16  Shelly Jackson is a 87 y.o. year old female who is a primary care patient of Luking, Elayne Snare, MD. I reached out to [name] by phone today in response to a referral sent by [name] PCP.  [name] was given information about Chronic Care Management services today including:  CCM service includes personalized support from designated clinical staff supervised by her physician, including individualized plan of care and coordination with other care providers 24/7 contact phone numbers for assistance for urgent and routine care needs. Service will only be billed when office clinical staff spend 20 minutes or more in a month to coordinate care. Only one practitioner may furnish and bill the service in a calendar month. The patient may stop CCM services at any time (effective at the end of the month) by phone call to the office staff. The patient is responsible for co-pay (up to 20% after annual deductible is met) if co-pay is required by the individual health plan.   Patient agreed to services and verbal consent obtained.   Follow up plan: Telephone appointment with care management team member scheduled for:06/08/21  [place]: [phone]

## 2021-06-08 ENCOUNTER — Ambulatory Visit (INDEPENDENT_AMBULATORY_CARE_PROVIDER_SITE_OTHER): Payer: Medicare Other

## 2021-06-08 DIAGNOSIS — I10 Essential (primary) hypertension: Secondary | ICD-10-CM

## 2021-06-08 DIAGNOSIS — E1142 Type 2 diabetes mellitus with diabetic polyneuropathy: Secondary | ICD-10-CM

## 2021-06-08 NOTE — Patient Instructions (Signed)
Visit Information  Thank you for taking time to visit with me today. Please don't hesitate to contact me if I can be of assistance to you before our next scheduled telephone appointment.  Following are the goals we discussed today:  - follow-up on any referrals for help I am given - think ahead to make sure my need does not become an emergency - have a back-up plan - make a list of family or friends that I can call  Our next appointment is by telephone on 06/19/21 at 11am  Please call the care guide team at [phone] if you need to cancel or reschedule your appointment.   If you are experiencing a Mental Health or [place] or need someone to talk to, please call the Suicide and Crisis Lifeline: 988   Following is a copy of your full plan of care:  Care Plan : General Social Work (Adult)  Updates made by [employer], Darla Lesches, LCSW since 06/08/2021 12:00 AM     Problem: CHL AMB "PATIENT-SPECIFIC PROBLEM"   Note:   CARE PLAN ENTRY (see longitudinal plan of care for additional care plan information)  Current Barriers:  Patient with HTN and DM in need of assistance with connection to community resources  Knowledge deficits and need for support, education and care coordination related to community resources support  Lacks knowledge of community resource: related to personal care services  Clinical Goal(s)  Over the next 90 days, patient's son will follow up with home care agencies provided for possible in home care  Interventions provided by LCSW:  Assessed patient's care coordination needs related to in home personal care services and discussed ongoing care management follow up  Per patient's son, patient's care needs are progressing and will need additional assistance in the home It was confirmed that patient's son lives in the home, however works during the day Confirmed that patient no longer drives and has a Building services engineer Confirmed that PT services have been ordered  for patient, personal care services discussed as an additional services, however would be an out of pocket expense.  Provided patient's son with information about personal care services and anticipated cost Advised patient's son to review the list of personal care agencies provided to discuss patient's needs in detail to confirm expected cost. Motivational Interviewing employed Solution-Focused Strategies employed:  Active listening / Reflection utilized  Emotional Support Provided Follow up with personal care services encouraged as well as [facility] to determine possible Aid and Attendance benefits [phone]    Patient Self Care Activities & Deficits:  Patient is unable to independently navigate community resource options without care coordination support  Acknowledges deficits and is motivated to resolve concern  Patient's son  is able to contact   as discussed today Unable to perform ADLs independently Unable to perform IADLs independently Strong family or social support  Initial goal documentation       Ms. [place] was given information about Care Management services by the embedded care coordination team including:  Care Management services include personalized support from designated clinical staff supervised by her physician, including individualized plan of care and coordination with other care providers 24/7 contact phone numbers for assistance for urgent and routine care needs. The patient may stop CCM services at any time (effective at the end of the month) by phone call to the office staff.  Patient agreed to services and verbal consent obtained.   Patient verbalizes understanding of instructions and care plan provided today and  agrees to view in [place]. Active MyChart status confirmed with patient.    Telephone follow up appointment with care management team member scheduled for: 06/15/21  Elliot Gurney, [place] [phone]

## 2021-06-08 NOTE — Chronic Care Management (AMB) (Signed)
Chronic Care Management    Clinical Social Work Note  06/08/2021 Name: Shelly Jackson MRN: [id number] DOB: [birth year]  Shelly Jackson is a 87 y.o. year old female who is a primary care patient of Luking, Elayne Snare, MD. The CCM team was consulted to assist the patient with chronic disease management and/or care coordination needs related to: [employer] .   Collaboration with patient's son  for initial visit in response to provider referral for social work chronic care management and care coordination services.   Consent to Services:  The patient was given the following information about Chronic Care Management services today, agreed to services, and gave verbal consent: 1. CCM service includes personalized support from designated clinical staff supervised by the primary care provider, including individualized plan of care and coordination with other care providers 2. 24/7 contact phone numbers for assistance for urgent and routine care needs. 3. Service will only be billed when office clinical staff spend 20 minutes or more in a month to coordinate care. 4. Only one practitioner may furnish and bill the service in a calendar month. 5.The patient may stop CCM services at any time (effective at the end of the month) by phone call to the office staff. 6. The patient will be responsible for cost sharing (co-pay) of up to 20% of the service fee (after annual deductible is met). Patient agreed to services and consent obtained.  Patient agreed to services and consent obtained.   Assessment: Review of patient past medical history, allergies, medications, and health status, including review of relevant consultants reports was performed today as part of a comprehensive evaluation and provision of chronic care management and care coordination services.     SDOH (Social Determinants of Health) assessments and interventions performed:    Advanced Directives Status: Not addressed in this  encounter.  CCM Care Plan  Allergies  Allergen Reactions   Daypro [Oxaprozin]     Indigestion     Outpatient Encounter Medications as of 06/08/2021  Medication Sig   Accu-Chek Softclix Lancets lancets Use as instructed   amLODipine (NORVASC) 2.5 MG tablet TAKE 1 TABLET BY MOUTH  DAILY   B-D UF III MINI PEN NEEDLES 31G X 5 MM MISC USE TO ADMINISTER INSULIN  AS DIRECTED   blood glucose meter kit and supplies Dispense based on patient and insurance preference. Use to test blood sugar 3 times a day (FOR ICD-10 E10.9, E11.9).   busPIRone (BUSPAR) 5 MG tablet TAKE 1 TABLET(5 MG) BY MOUTH TWICE DAILY FOR ANXIETY   citalopram (CELEXA) 20 MG tablet TAKE 1 TABLET BY MOUTH  DAILY   glucose blood (ONE TOUCH ULTRA TEST) test strip TEST BLOOD SUGAR THREE TIMES DAILY   glucose blood test strip Use as instructed   insulin glargine (LANTUS SOLOSTAR) 100 UNIT/ML Solostar Pen INJECT SUBCUTANEOUSLY 48  UNITS AT BEDTIME   Insulin Syringes, Disposable, U-100 1 ML MISC Use to administer insulin   losartan (COZAAR) 50 MG tablet TAKE 1 TABLET BY MOUTH  DAILY   metFORMIN (GLUCOPHAGE) 1000 MG tablet TAKE 1 TABLET BY MOUTH IN  THE MORNING AND ONE-HALF  TABLET BY MOUTH AT NIGHT   potassium chloride (KLOR-CON) 10 MEQ tablet Take 1 tablet on Mondays, wednesdays and fridays.   pravastatin (PRAVACHOL) 10 MG tablet Take 1 tablet (10 mg total) by mouth daily.   triamcinolone cream (KENALOG) 0.1 % Apply thin amount twice a day as needed to help with itching around the umbilicus (Patient not taking: Reported on  03/10/2021)   No facility-administered encounter medications on file as of 06/08/2021.    Patient Active Problem List   Diagnosis Date Noted   Personal history of fall 07/15/2020   Hypokalemia 07/15/2020   Proliferative diabetic retinopathy of left eye with macular edema associated with type 2 diabetes mellitus ([place]) 11/19/2019   Retinal hemorrhage, bilateral 11/19/2019   Diabetic macular edema of right eye with  proliferative retinopathy associated with type 2 diabetes mellitus ([place]) 11/19/2019   Adrenal adenoma 02/18/2017   Aortic atherosclerosis ([place]) 02/18/2017   Kidney stones 02/18/2017   Hematuria 02/18/2017   Diabetic retinopathy ([place]) 12/24/2014   Diabetic peripheral neuropathy ([place]) 04/25/2014   HTN (hypertension) 04/10/2013   Hyperlipemia, mixed 11/28/2012   Thrombocytopenia ([place]) 07/26/2011   Diverticulitis 07/24/2011   Polyp of colon 07/24/2011   Lower gastrointestinal bleed 07/23/2011   Acute blood loss anemia 07/23/2011   Sinus bradycardia 07/23/2011   Obesity 07/23/2011   DM type 2 (diabetes mellitus, type 2) ([place]) 07/23/2011    Conditions to be addressed/monitored: HTN and DMII; Limited access to caregiver  Care Plan : General Social Work (Adult)  Updates made by [employer], Darla Lesches, LCSW since 06/08/2021 12:00 AM     Problem: CHL AMB "PATIENT-SPECIFIC PROBLEM"   Note:   CARE PLAN ENTRY (see longitudinal plan of care for additional care plan information)  Current Barriers:  Patient with HTN and DM in need of assistance with connection to community resources  Knowledge deficits and need for support, education and care coordination related to community resources support  Lacks knowledge of community resource: related to personal care services  Clinical Goal(s)  Over the next 90 days, patient's son will follow up with home care agencies provided for possible in home care  Interventions provided by LCSW:  Assessed patient's care coordination needs related to in home personal care services and discussed ongoing care management follow up  Per patient's son, patient's care needs are progressing and will need additional assistance in the home It was confirmed that patient's son lives in the home, however works during the day Confirmed that patient no longer drives and has a Building services engineer Confirmed that PT services have been ordered for patient, personal care services discussed as  an additional services, however would be an out of pocket expense.  Provided patient's son with information about personal care services and anticipated cost Advised patient's son to review the list of personal care agencies provided to discuss patient's needs in detail to confirm expected cost. Motivational Interviewing employed Solution-Focused Strategies employed:  Active listening / Reflection utilized  Emotional Support Provided Follow up with personal care services encouraged as well as the [place] to determine possible Aid and Attendance benefits [phone]    Patient Self Care Activities & Deficits:  Patient is unable to independently navigate community resource options without care coordination support  Acknowledges deficits and is motivated to resolve concern  Patient's son  is able to contact   as discussed today Unable to perform ADLs independently Unable to perform IADLs independently Strong family or social support  Initial goal documentation        Follow Up Plan: SW will follow up with patient by phone over the next 14 business days      [employer], [place] [phone]

## 2021-06-09 DIAGNOSIS — E1142 Type 2 diabetes mellitus with diabetic polyneuropathy: Secondary | ICD-10-CM

## 2021-06-09 DIAGNOSIS — I10 Essential (primary) hypertension: Secondary | ICD-10-CM

## 2021-06-10 ENCOUNTER — Telehealth: Payer: Self-pay | Admitting: Family Medicine

## 2021-06-10 NOTE — Telephone Encounter (Signed)
Shelly Chancy, MD; Vicente Males, LPN; Dairl Ponder, RN; Leda Min, LPN Good Morning - Per [place] - this Patient will need a face to face visit in order for her insurance to cover [facility]. Please have Patient scheduled an face to face visit if she still requests [facility] services. Thanks for all your help!   Please call patient and have her set up face to face for [place]. Thank you!

## 2021-06-15 ENCOUNTER — Other Ambulatory Visit: Payer: Self-pay | Admitting: Family Medicine

## 2021-06-15 DIAGNOSIS — E876 Hypokalemia: Secondary | ICD-10-CM

## 2021-06-19 ENCOUNTER — Telehealth: Payer: Medicare Other

## 2021-06-22 ENCOUNTER — Ambulatory Visit: Payer: Medicare Other | Admitting: Family Medicine

## 2021-07-08 ENCOUNTER — Ambulatory Visit: Payer: Medicare Other | Admitting: Family Medicine

## 2021-07-13 ENCOUNTER — Ambulatory Visit (INDEPENDENT_AMBULATORY_CARE_PROVIDER_SITE_OTHER): Payer: Medicare PPO | Admitting: Family Medicine

## 2021-07-13 ENCOUNTER — Other Ambulatory Visit: Payer: Self-pay

## 2021-07-13 VITALS — BP 130/72 | HR 72 | Temp 97.8°F | Ht 62.0 in | Wt 212.2 lb

## 2021-07-13 DIAGNOSIS — D696 Thrombocytopenia, unspecified: Secondary | ICD-10-CM

## 2021-07-13 DIAGNOSIS — I10 Essential (primary) hypertension: Secondary | ICD-10-CM | POA: Diagnosis not present

## 2021-07-13 DIAGNOSIS — E1142 Type 2 diabetes mellitus with diabetic polyneuropathy: Secondary | ICD-10-CM

## 2021-07-13 DIAGNOSIS — E113511 Type 2 diabetes mellitus with proliferative diabetic retinopathy with macular edema, right eye: Secondary | ICD-10-CM | POA: Diagnosis not present

## 2021-07-13 DIAGNOSIS — I700 Atherosclerosis of aorta: Secondary | ICD-10-CM

## 2021-07-13 NOTE — Progress Notes (Signed)
? ?  Subjective:  ? ? Patient ID: Shelly Jackson, female    DOB: 1934/11/07, 87 y.o.   MRN: [id number] ? ?HPI ? ?Patient here for 4 month follow up on blood pressure ?Primary hypertension - Plan: CBC with Differential, Lipid Profile, Hemoglobin Y8M, Basic Metabolic Panel (BMET), Hepatic function panel ? ?Type 2 diabetes mellitus with diabetic polyneuropathy, unspecified whether long term insulin use ([place]) - Plan: CBC with Differential, Lipid Profile, Hemoglobin V7Q, Basic Metabolic Panel (BMET), Hepatic function panel ? ?Aortic atherosclerosis ([facility]) - Plan: CBC with Differential, Lipid Profile, Hemoglobin I6N, Basic Metabolic Panel (BMET), Hepatic function panel ? ?Diabetic macular edema of right eye with proliferative retinopathy associated with type 2 diabetes mellitus ([place]), Chronic - Plan: CBC with Differential, Lipid Profile, Hemoglobin G2X, Basic Metabolic Panel (BMET), Hepatic function panel ? ?Thrombocytopenia ([place]), Chronic - Plan: CBC with Differential, Lipid Profile, Hemoglobin B2W, Basic Metabolic Panel (BMET), Hepatic function panel ? ?Morbid obesity ([place]) ? ?Patient has history of thrombocytopenia.  No bleeding issues.  Does get some bruising intermittently. ?History of high blood pressure.  She brings in all of her medicines for review.  She has several bottles which are duplicates.  We went over and trying to streamline things and write things out for her ?Diabetes under decent control with her insulin as well as metformin no low blood sugars ?Macular degeneration along with proliferative retinopathy with diabetes important keep A1c under good control ?Morbid obesity portion control regular activity ? ?Review of Systems ? ?   ?Objective:  ? Physical Exam ?General-in no acute distress ?Eyes-no discharge ?Lungs-respiratory rate normal, CTA ?CV-no murmurs,RRR ?Extremities skin warm dry no edema ?Neuro grossly normal ?Behavior normal, alert ? ? ? ? ?   ?Assessment & Plan:  ?1. Primary hypertension ?Blood  pressure decent control continue medication, eliminated some empty pill bottles ? ?- CBC with Differential ?- Lipid Profile ?- Hemoglobin U1L ?- Basic Metabolic Panel (BMET) ?- Hepatic function panel ? ?2. Type 2 diabetes mellitus with diabetic polyneuropathy, unspecified whether long term insulin use ([place]) ?No low blood sugars.  Taking her medicine as directed not having any setbacks or problems ?- CBC with Differential ?- Lipid Profile ?- Hemoglobin K4M ?- Basic Metabolic Panel (BMET) ?- Hepatic function panel ? ?3. Aortic atherosclerosis ([place]) ?Aortic atherosclerosis continue statin medication.  Healthy diet.  Check labs ?- CBC with Differential ?- Lipid Profile ?- Hemoglobin W1U ?- Basic Metabolic Panel (BMET) ?- Hepatic function panel ? ?4. Diabetic macular edema of right eye with proliferative retinopathy associated with type 2 diabetes mellitus ([place]) ?Keep A1c under good control check labs continue current medication ?- CBC with Differential ?- Lipid Profile ?- Hemoglobin U7O ?- Basic Metabolic Panel (BMET) ?- Hepatic function panel ? ?5. Thrombocytopenia ([place]) ?Thrombocytopenia history no bleeding issues currently check CBC to monitor this ?- CBC with Differential ?- Lipid Profile ?- Hemoglobin Z3G ?- Basic Metabolic Panel (BMET) ?- Hepatic function panel ? ?6. Morbid obesity ([place]) ?Morbid obesity with BMI of 38 along with significant comorbidities portion control regular activity recommended ? ?Follow-up in 3 to 4 months ? ?Patient does not feel that she needs any type of home health she states that she can be able to do most of what she needs done she does have an assistant that comes sees her every 2 weeks to help her move around some as well as take a bath which is a good idea continue this service ? ? ?

## 2021-07-14 LAB — LIPID PANEL

## 2021-07-15 LAB — LIPID PANEL
Chol/HDL Ratio: 3.2 ratio (ref 0.0–4.4)
Cholesterol, Total: 149 mg/dL (ref 100–199)
HDL: 47 mg/dL (ref >39)
LDL Chol Calc (NIH): 70 mg/dL (ref 0–99)
Triglycerides: 190 mg/dL — ABNORMAL HIGH (ref 0–149)
VLDL Cholesterol Cal: 32 mg/dL (ref 5–40)

## 2021-07-15 LAB — BASIC METABOLIC PANEL
BUN/Creatinine Ratio: 14 (ref 12–28)
BUN: 13 mg/dL (ref 8–27)
CO2: 22 mmol/L (ref 20–29)
Calcium: 8.4 mg/dL — ABNORMAL LOW (ref 8.7–10.3)
Chloride: 106 mmol/L (ref 96–106)
Creatinine, Ser: 0.93 mg/dL (ref 0.57–1.00)
Glucose: 158 mg/dL — ABNORMAL HIGH (ref 70–99)
Potassium: 4.1 mmol/L (ref 3.5–5.2)
Sodium: 144 mmol/L (ref 134–144)
eGFR: 61 mL/min/{1.73_m2} (ref >59)

## 2021-07-15 LAB — CBC WITH DIFFERENTIAL/PLATELET
Basophils Absolute: 0 10*3/uL (ref 0.0–0.2)
Basos: 0 %
EOS (ABSOLUTE): 0.1 10*3/uL (ref 0.0–0.4)
Eos: 1 %
Hematocrit: 40.3 % (ref 34.0–46.6)
Hemoglobin: 13.1 g/dL (ref 11.1–15.9)
Immature Grans (Abs): 0.1 10*3/uL (ref 0.0–0.1)
Immature Granulocytes: 1 %
Lymphocytes Absolute: 2.4 10*3/uL (ref 0.7–3.1)
Lymphs: 23 %
MCH: 29.2 pg (ref 26.6–33.0)
MCHC: 32.5 g/dL (ref 31.5–35.7)
MCV: 90 fL (ref 79–97)
Monocytes Absolute: 0.5 10*3/uL (ref 0.1–0.9)
Monocytes: 4 %
Neutrophils Absolute: 7.6 10*3/uL — ABNORMAL HIGH (ref 1.4–7.0)
Neutrophils: 71 %
Platelets: 219 10*3/uL (ref 150–450)
RBC: 4.49 x10E6/uL (ref 3.77–5.28)
RDW: 14.1 % (ref 11.7–15.4)
WBC: 10.6 10*3/uL (ref 3.4–10.8)

## 2021-07-15 LAB — HEPATIC FUNCTION PANEL
ALT: 16 IU/L (ref 0–32)
AST: 17 IU/L (ref 0–40)
Albumin: 3.8 g/dL (ref 3.6–4.6)
Alkaline Phosphatase: 77 IU/L (ref 44–121)
Bilirubin Total: 0.3 mg/dL (ref 0.0–1.2)
Bilirubin, Direct: 0.13 mg/dL (ref 0.00–0.40)
Total Protein: 6.1 g/dL (ref 6.0–8.5)

## 2021-07-15 LAB — HEMOGLOBIN A1C
Est. average glucose Bld gHb Est-mCnc: 137 mg/dL
Hgb A1c MFr Bld: 6.4 % — ABNORMAL HIGH (ref 4.8–5.6)

## 2021-10-13 ENCOUNTER — Encounter: Payer: Self-pay | Admitting: Family Medicine

## 2021-10-13 ENCOUNTER — Encounter (HOSPITAL_COMMUNITY): Payer: Self-pay | Admitting: Emergency Medicine

## 2021-10-13 ENCOUNTER — Inpatient Hospital Stay (HOSPITAL_COMMUNITY)
Admission: EM | Admit: 2021-10-13 | Discharge: 2021-10-19 | DRG: 308 | Disposition: A | Discharge status: Skilled Nursing Facility | Payer: Medicare Other | Source: Ambulatory Visit | Attending: Internal Medicine | Admitting: Internal Medicine

## 2021-10-13 ENCOUNTER — Ambulatory Visit (INDEPENDENT_AMBULATORY_CARE_PROVIDER_SITE_OTHER): Payer: Medicare Other | Admitting: Family Medicine

## 2021-10-13 ENCOUNTER — Other Ambulatory Visit: Payer: Self-pay

## 2021-10-13 ENCOUNTER — Emergency Department (HOSPITAL_COMMUNITY): Payer: Medicare Other

## 2021-10-13 VITALS — BP 134/86 | HR 145 | Temp 96.3°F | Wt 202.0 lb

## 2021-10-13 DIAGNOSIS — R296 Repeated falls: Secondary | ICD-10-CM | POA: Diagnosis not present

## 2021-10-13 DIAGNOSIS — I10 Essential (primary) hypertension: Secondary | ICD-10-CM

## 2021-10-13 DIAGNOSIS — R410 Disorientation, unspecified: Secondary | ICD-10-CM

## 2021-10-13 DIAGNOSIS — I4891 Unspecified atrial fibrillation: Secondary | ICD-10-CM | POA: Diagnosis present

## 2021-10-13 DIAGNOSIS — D72829 Elevated white blood cell count, unspecified: Secondary | ICD-10-CM

## 2021-10-13 DIAGNOSIS — Z79899 Other long term (current) drug therapy: Secondary | ICD-10-CM

## 2021-10-13 DIAGNOSIS — E1142 Type 2 diabetes mellitus with diabetic polyneuropathy: Secondary | ICD-10-CM

## 2021-10-13 DIAGNOSIS — Z794 Long term (current) use of insulin: Secondary | ICD-10-CM

## 2021-10-13 DIAGNOSIS — Z6836 Body mass index (BMI) 36.0-36.9, adult: Secondary | ICD-10-CM

## 2021-10-13 DIAGNOSIS — R778 Other specified abnormalities of plasma proteins: Secondary | ICD-10-CM | POA: Diagnosis not present

## 2021-10-13 DIAGNOSIS — Z7984 Long term (current) use of oral hypoglycemic drugs: Secondary | ICD-10-CM

## 2021-10-13 DIAGNOSIS — B9620 Unspecified Escherichia coli [E. coli] as the cause of diseases classified elsewhere: Secondary | ICD-10-CM | POA: Diagnosis present

## 2021-10-13 DIAGNOSIS — I4892 Unspecified atrial flutter: Principal | ICD-10-CM

## 2021-10-13 DIAGNOSIS — I484 Atypical atrial flutter: Principal | ICD-10-CM | POA: Diagnosis present

## 2021-10-13 DIAGNOSIS — I248 Other forms of acute ischemic heart disease: Secondary | ICD-10-CM | POA: Diagnosis present

## 2021-10-13 DIAGNOSIS — R7989 Other specified abnormal findings of blood chemistry: Secondary | ICD-10-CM

## 2021-10-13 DIAGNOSIS — N390 Urinary tract infection, site not specified: Secondary | ICD-10-CM | POA: Diagnosis present

## 2021-10-13 DIAGNOSIS — E860 Dehydration: Secondary | ICD-10-CM | POA: Diagnosis present

## 2021-10-13 DIAGNOSIS — E039 Hypothyroidism, unspecified: Secondary | ICD-10-CM | POA: Diagnosis present

## 2021-10-13 DIAGNOSIS — E876 Hypokalemia: Secondary | ICD-10-CM | POA: Diagnosis present

## 2021-10-13 DIAGNOSIS — E669 Obesity, unspecified: Secondary | ICD-10-CM | POA: Diagnosis present

## 2021-10-13 DIAGNOSIS — Z87442 Personal history of urinary calculi: Secondary | ICD-10-CM

## 2021-10-13 DIAGNOSIS — N179 Acute kidney failure, unspecified: Secondary | ICD-10-CM | POA: Diagnosis not present

## 2021-10-13 DIAGNOSIS — E1165 Type 2 diabetes mellitus with hyperglycemia: Secondary | ICD-10-CM

## 2021-10-13 DIAGNOSIS — Z8249 Family history of ischemic heart disease and other diseases of the circulatory system: Secondary | ICD-10-CM

## 2021-10-13 DIAGNOSIS — Z22322 Carrier or suspected carrier of Methicillin resistant Staphylococcus aureus: Secondary | ICD-10-CM

## 2021-10-13 DIAGNOSIS — G9341 Metabolic encephalopathy: Secondary | ICD-10-CM | POA: Diagnosis present

## 2021-10-13 DIAGNOSIS — K219 Gastro-esophageal reflux disease without esophagitis: Secondary | ICD-10-CM

## 2021-10-13 DIAGNOSIS — R9431 Abnormal electrocardiogram [ECG] [EKG]: Secondary | ICD-10-CM

## 2021-10-13 DIAGNOSIS — E133399 Other specified diabetes mellitus with moderate nonproliferative diabetic retinopathy without macular edema, unspecified eye: Secondary | ICD-10-CM

## 2021-10-13 DIAGNOSIS — R000 Tachycardia, unspecified: Secondary | ICD-10-CM

## 2021-10-13 DIAGNOSIS — F419 Anxiety disorder, unspecified: Secondary | ICD-10-CM | POA: Diagnosis present

## 2021-10-13 LAB — BASIC METABOLIC PANEL WITH GFR
Anion gap: 5 (ref 5–15)
BUN: 30 mg/dL — ABNORMAL HIGH (ref 8–23)
CO2: 24 mmol/L (ref 22–32)
Calcium: 8.5 mg/dL — ABNORMAL LOW (ref 8.9–10.3)
Chloride: 110 mmol/L (ref 98–111)
Creatinine, Ser: 1.17 mg/dL — ABNORMAL HIGH (ref 0.44–1.00)
GFR, Estimated: 46 mL/min — ABNORMAL LOW (ref >60)
Glucose, Bld: 306 mg/dL — ABNORMAL HIGH (ref 70–99)
Potassium: 4.1 mmol/L (ref 3.5–5.1)
Sodium: 139 mmol/L (ref 135–145)

## 2021-10-13 LAB — CBC
HCT: 43 % (ref 36.0–46.0)
Hemoglobin: 13.7 g/dL (ref 12.0–15.0)
MCH: 29.4 pg (ref 26.0–34.0)
MCHC: 31.9 g/dL (ref 30.0–36.0)
MCV: 92.3 fL (ref 80.0–100.0)
Platelets: 252 10*3/uL (ref 150–400)
RBC: 4.66 MIL/uL (ref 3.87–5.11)
RDW: 14.6 % (ref 11.5–15.5)
WBC: 11.5 10*3/uL — ABNORMAL HIGH (ref 4.0–10.5)
nRBC: 0 % (ref 0.0–0.2)

## 2021-10-13 LAB — TROPONIN I (HIGH SENSITIVITY)
Troponin I (High Sensitivity): 48 ng/L — ABNORMAL HIGH (ref <18)
Troponin I (High Sensitivity): 41 ng/L — ABNORMAL HIGH (ref <18)

## 2021-10-13 LAB — GLUCOSE, CAPILLARY: Glucose-Capillary: 242 mg/dL — ABNORMAL HIGH (ref 70–99)

## 2021-10-13 MED ORDER — INSULIN ASPART 100 UNIT/ML IJ SOLN
0.0000 [IU] | Freq: Three times a day (TID) | INTRAMUSCULAR | Status: DC
  Administered 2021-10-14: 2 [IU] via SUBCUTANEOUS
  Administered 2021-10-14: 5 [IU] via SUBCUTANEOUS
  Administered 2021-10-14 – 2021-10-15 (×3): 2 [IU] via SUBCUTANEOUS
  Administered 2021-10-16: 1 [IU] via SUBCUTANEOUS
  Administered 2021-10-16: 2 [IU] via SUBCUTANEOUS
  Administered 2021-10-16: 3 [IU] via SUBCUTANEOUS
  Administered 2021-10-17: 2 [IU] via SUBCUTANEOUS
  Administered 2021-10-17: 3 [IU] via SUBCUTANEOUS
  Administered 2021-10-17: 2 [IU] via SUBCUTANEOUS
  Administered 2021-10-18: 3 [IU] via SUBCUTANEOUS
  Administered 2021-10-18: 2 [IU] via SUBCUTANEOUS
  Administered 2021-10-18 – 2021-10-19 (×2): 1 [IU] via SUBCUTANEOUS
  Administered 2021-10-19: 3 [IU] via SUBCUTANEOUS

## 2021-10-13 MED ORDER — ACETAMINOPHEN 650 MG RE SUPP: 650.0000 mg | Freq: Four times a day (QID) | RECTAL | Status: DC | PRN

## 2021-10-13 MED ORDER — CHLORHEXIDINE GLUCONATE CLOTH 2 % EX PADS
6.0000 | MEDICATED_PAD | Freq: Every day | CUTANEOUS | Status: DC
  Administered 2021-10-13 – 2021-10-19 (×6): 6 via TOPICAL

## 2021-10-13 MED ORDER — INSULIN GLARGINE-YFGN 100 UNIT/ML ~~LOC~~ SOLN
10.0000 [IU] | Freq: Every day | SUBCUTANEOUS | Status: DC
  Administered 2021-10-13: 10 [IU] via SUBCUTANEOUS
  Filled 2021-10-13 (×2): qty 0.1

## 2021-10-13 MED ORDER — PANTOPRAZOLE SODIUM 40 MG PO TBEC
40.0000 mg | DELAYED_RELEASE_TABLET | Freq: Every day | ORAL | Status: DC
  Administered 2021-10-14 – 2021-10-19 (×6): 40 mg via ORAL
  Filled 2021-10-13 (×6): qty 1

## 2021-10-13 MED ORDER — ADENOSINE 6 MG/2ML IV SOLN: 6.0000 mg | Freq: Once | INTRAVENOUS | Status: AC

## 2021-10-13 MED ORDER — SODIUM CHLORIDE 0.9 % IV SOLN: INTRAVENOUS | Status: AC

## 2021-10-13 MED ORDER — ACETAMINOPHEN 325 MG PO TABS
650.0000 mg | ORAL_TABLET | Freq: Four times a day (QID) | ORAL | Status: DC | PRN
  Administered 2021-10-15: 650 mg via ORAL
  Filled 2021-10-13: qty 2

## 2021-10-13 MED ORDER — INSULIN ASPART 100 UNIT/ML IJ SOLN
0.0000 [IU] | Freq: Every day | INTRAMUSCULAR | Status: DC
  Administered 2021-10-13 – 2021-10-15 (×2): 2 [IU] via SUBCUTANEOUS

## 2021-10-13 MED ORDER — DILTIAZEM LOAD VIA INFUSION
15.0000 mg | Freq: Once | INTRAVENOUS | Status: AC
  Administered 2021-10-13: 15 mg via INTRAVENOUS
  Filled 2021-10-13: qty 15

## 2021-10-13 MED ORDER — DILTIAZEM HCL-DEXTROSE 125-5 MG/125ML-% IV SOLN (PREMIX)
5.0000 mg/h | INTRAVENOUS | Status: DC
  Administered 2021-10-13: 5 mg/h via INTRAVENOUS
  Administered 2021-10-14: 10 mg/h via INTRAVENOUS
  Filled 2021-10-13 (×2): qty 125

## 2021-10-13 MED ORDER — ADENOSINE 6 MG/2ML IV SOLN
INTRAVENOUS | Status: AC
  Administered 2021-10-13: 6 mg via INTRAVENOUS
  Filled 2021-10-13: qty 2

## 2021-10-13 MED ORDER — APIXABAN 5 MG PO TABS
5.0000 mg | ORAL_TABLET | Freq: Two times a day (BID) | ORAL | Status: DC
  Administered 2021-10-13 – 2021-10-19 (×12): 5 mg via ORAL
  Filled 2021-10-13 (×10): qty 1
  Filled 2021-10-13: qty 2
  Filled 2021-10-13: qty 1

## 2021-10-13 NOTE — H&P (Signed)
History and Physical    Patient: Shelly Jackson DOB: [birth year] DOA: 10/13/2021 DOS: the patient was seen and examined on 10/13/2021 PCP: Kathyrn Drown, MD  Patient coming from: Home  Chief Complaint:  Chief Complaint  Patient presents with   Palpitations   HPI: Shelly Jackson is an 87 y.o. female with medical history significant of GERD, T2DM, hypertension, hyperlipidemia who presents to the emergency department from her PCPs office due to palpitations.  Patient states that she went for a routine checkup at her primary care's office and she was noted to have an increased heart rate (150s), so she was sent to the ED for further evaluation.  Patient denies chest pain, shortness of breath, headache, fever, chills, nausea, vomiting, abdominal pain, blurry vision or lightheadedness.  ED Course:  In the emergency department, she was intermittently tachypneic and was initially tachycardic.  BP was 162/114, temperature 96.30F and O2 sat was 95% on room air.  Work-up in the ED showed normal CBC except for leukocytosis, and normal BMP except for hyperglycemia and BUN/creatinine 30/1.17 (baseline creatinine 0.7 x 0.9).  Troponin x1 - 48. Chest x-ray showed no active disease Patient was noted to be in atrial flutter and IV adenosine was initially given.  She was subsequently given IV loading dose of Cardizem and subsequently placed on IV Cardizem drip.  Hospitalist was asked to admit patient for further evaluation and management.  Review of Systems: Review of systems as noted in the HPI. All other systems reviewed and are negative.   Past Medical History:  Diagnosis Date   Acid reflux    Anemia    Secondary to acute blood loss   Anxiety    Arthritis    RA   CHF (congestive heart failure) (HCC)    Diabetes mellitus    Diabetic retinopathy ([place])    Diverticulitis 07/24/11   Inpatient [facility]   History of kidney stones    Hypertension    Obesity    Rheumatoid arthritis(714.0)     Sleep apnea    Thrombocytopenia (HCC)    Tubular adenoma of colon 07/24/11   Past Surgical History:  Procedure Laterality Date   APPENDECTOMY     CATARACT EXTRACTION Bilateral (right 08/2014) ( left 2005)   CHOLECYSTECTOMY     COLONOSCOPY  07/24/2011   Dr. Deatra Ina MM polyp at proximal ascending colon could not be removed because of difficult approach, pancolonic diverticulosis with diverticulitis involving the ascending colon, and suspected diverticular bleed, 7 mm polyp snared from proximal sigmoid colon with hemorrhagic surface found to be a tubular adenoma, small external hemorrhoids   COLONOSCOPY  08/2007   Dr. Malva Limes hyperplastic polyp   COLONOSCOPY N/A 12/14/2012   Procedure: COLONOSCOPY;  Surgeon: Daneil Dolin, MD;  Location: [facility];  Service: Endoscopy;  Laterality: N/A;  8:30   CYSTOSCOPY/RETROGRADE/URETEROSCOPY Right 06/27/2017   Procedure: CYSTOSCOPY/RIGHT RETROGRADE PYELOGRAM/RIGHT URETEROSCOPY;  Surgeon: Cleon Gustin, MD;  Location: [facility]S;  Service: Urology;  Laterality: Right;   HOLMIUM LASER APPLICATION Right 08/21/2438   Procedure: HOLMIUM LASER LITHOTRIPSY RIGHT RENAL CALCULUS;  Surgeon: Cleon Gustin, MD;  Location: [facility];  Service: Urology;  Laterality: Right;   TONSILLECTOMY      Social History:  reports that she has never smoked. She has never used smokeless tobacco. She reports that she does not drink alcohol and does not use drugs.   Allergies  Allergen Reactions   Daypro [Oxaprozin]     Indigestion  Family History  Problem Relation Age of Onset   Coronary artery disease Father    Heart attack Father    Cirrhosis Mother    Hypertension Mother    Colon cancer Neg Hx      Prior to Admission medications   Medication Sig Start Date End Date Taking? Authorizing Provider  busPIRone (BUSPAR) 5 MG tablet TAKE 1 TABLET(5 MG) BY MOUTH TWICE DAILY FOR ANXIETY Patient taking differently: Take 5 mg by mouth 2 (two) times daily. 05/13/21   Yes Luking, Elayne Snare, MD  citalopram (CELEXA) 20 MG tablet TAKE 1 TABLET BY MOUTH  DAILY 12/25/20  Yes Luking, Scott A, MD  insulin glargine (LANTUS SOLOSTAR) 100 UNIT/ML Solostar Pen INJECT SUBCUTANEOUSLY 48  UNITS AT BEDTIME Patient taking differently: Inject 48 Units into the skin at bedtime. 03/23/21  Yes Luking, Scott A, MD  metFORMIN (GLUCOPHAGE) 1000 MG tablet TAKE 1 TABLET BY MOUTH IN  THE MORNING AND ONE-HALF  TABLET BY MOUTH AT NIGHT 12/25/20  Yes Luking, Scott A, MD  pantoprazole (PROTONIX) 40 MG tablet Take 40 mg by mouth daily. 06/05/21  Yes [provider]  Accu-Chek Softclix Lancets lancets Use as instructed 03/27/19   Kathyrn Drown, MD  B-D UF III MINI PEN NEEDLES 31G X 5 MM MISC USE TO ADMINISTER INSULIN  AS DIRECTED 01/20/21   Kathyrn Drown, MD  blood glucose meter kit and supplies Dispense based on patient and insurance preference. Use to test blood sugar 3 times a day (FOR ICD-10 E10.9, E11.9). 03/27/19   Kathyrn Drown, MD  glucose blood (ONE TOUCH ULTRA TEST) test strip TEST BLOOD SUGAR THREE TIMES DAILY 12/27/18   Kathyrn Drown, MD  glucose blood test strip Use as instructed 03/27/19   Kathyrn Drown, MD  Insulin Syringes, Disposable, U-100 1 ML MISC Use to administer insulin 07/31/19   Kathyrn Drown, MD    Physical Exam: BP (!) 192/82   Pulse 77   Temp 97.9 F (36.6 C) (Oral)   Resp (!) 25   Ht '5\' 2"'  (1.575 m)   Wt 91.6 kg   SpO2 95%   BMI 36.94 kg/m   General: 87 y.o. year-old female well developed well nourished in no acute distress.  Alert and oriented x3. HEENT: NCAT, EOMI, dry mucous membrane. Neck: Supple, trachea medial Cardiovascular: Tachycardia, irregular rate and rhythm with no rubs or gallops.  No thyromegaly or JVD noted.  No lower extremity edema. 2/4 pulses in all 4 extremities. Respiratory: Clear to auscultation with no wheezes or rales. Good inspiratory effort. Abdomen: Soft, nontender nondistended with normal bowel sounds x4  quadrants. Muskuloskeletal: No cyanosis, clubbing or edema noted bilaterally Neuro: CN II-XII intact, strength 5/5 x 4, sensation, reflexes intact Skin: No ulcerative lesions noted or rashes Psychiatry: Judgement and insight appear normal. Mood is appropriate for condition and setting          Labs on Admission:  Basic Metabolic Panel: Recent Labs  Lab 10/13/21 1556  NA 139  K 4.1  CL 110  CO2 24  GLUCOSE 306*  BUN 30*  CREATININE 1.17*  CALCIUM 8.5*   Liver Function Tests: No results for input(s): AST, ALT, ALKPHOS, BILITOT, PROT, ALBUMIN in the last 168 hours. No results for input(s): LIPASE, AMYLASE in the last 168 hours. No results for input(s): AMMONIA in the last 168 hours. CBC: Recent Labs  Lab 10/13/21 1556  WBC 11.5*  HGB 13.7  HCT 43.0  MCV 92.3  PLT  252   Cardiac Enzymes: No results for input(s): CKTOTAL, CKMB, CKMBINDEX, TROPONINI in the last 168 hours.  BNP (last 3 results) No results for input(s): BNP in the last 8760 hours.  ProBNP (last 3 results) No results for input(s): PROBNP in the last 8760 hours.  CBG: No results for input(s): GLUCAP in the last 168 hours.  Radiological Exams on Admission: DG Chest Port 1 View  Result Date: 10/13/2021 CLINICAL DATA:  10009. Pt having palpitations that started today, seen at Dr. Lance Sell office and sent her for tachycardia. Hx of CHF EXAM: PORTABLE CHEST 1 VIEW COMPARISON:  Chest x-ray 02/03/2018, chest x-ray 09/24/2010 FINDINGS: Cardiac paddles overlie the chest. Stable prominent cardiac silhouette likely exaggerated by portable AP technique. The heart and mediastinal contours are unchanged. Aortic calcification. Biapical pleural/pulmonary scarring. No focal consolidation. Chronic coarsened markings with no overt pulmonary edema. No pleural effusion. No pneumothorax. No acute osseous abnormality. Degenerative changes of the right shoulder. Redemonstration of a well corticated 1.9 cm calcific density along the  left shoulder. IMPRESSION: 1. No active disease. 2.  Aortic Atherosclerosis (ICD10-I70.0). Electronically Signed   By: Iven Finn M.D.   On: 10/13/2021 16:36    EKG: I independently viewed the EKG done and my findings are as followed:    Assessment/Plan Present on Admission:  Atrial flutter with rapid ventricular response (HCC)  Obesity  Principal Problem:   Atrial flutter with rapid ventricular response (HCC) Active Problems:   Obesity   Essential hypertension   Atrial fibrillation, new onset ([place])   AKI (acute kidney injury) ([place])   Type 2 diabetes mellitus with hyperglycemia (HCC)   Leukocytosis   Prolonged QT interval   Elevated troponin   GERD (gastroesophageal reflux disease)  Paroxysmal atrial flutter with RVR/A-fib with rate control This was an incidental finding in patient's PCPs office during routine checkup, patient was asymptomatic.  No known cause at this time. CHA2DS2-VASc score is at least 5 which is equal to 7.2% annual risk of stroke HAS-BLED score = 2 Patient was started on IV Cardizem drip Continue Eliquis  Acute kidney injury Dehydration BUN/creatinine 30/1.17 (baseline creatinine 0.7- 0.9) Continue gentle hydration Renally adjust medications, avoid nephrotoxic agents/dehydration/hypotension  Prolonged QTc (493 ms) Avoid QT prolonging drugs Magnesium level will be checked Continue telemetry  T2DM with hyperglycemia Continue Semglee 10 units nightly and adjust dose accordingly Continue ISS and hypoglycemic protocol  Leukocytosis possibly reactive WBC 11.5, no suspicion for any acute infectious process at this time Continue to monitor WBC with morning labs  Elevated troponin possibly secondary to type II demand ischemia Troponin x1 - 48 > 41; troponin is flattened and patient denies any chest pain  Essential hypertension Continue IV Cardizem drip No antihypertensive medication noted on patient's med rec, we shall await updated med  rec  GERD Continue Protonix  Obesity (BMI 36.94) Diet and lifestyle modification   DVT prophylaxis: Eliquis   Advance Care Planning:   Code Status: Prior   Consults: None  Family Communication: None at bedside  Severity of Illness: The appropriate patient status for this patient is OBSERVATION. Observation status is judged to be reasonable and necessary in order to provide the required intensity of service to ensure the patient's safety. The patient's presenting symptoms, physical exam findings, and initial radiographic and laboratory data in the context of their medical condition is felt to place them at decreased risk for further clinical deterioration. Furthermore, it is anticipated that the patient will be medically stable for discharge from the  hospital within 2 midnights of admission.   Author: Bernadette Hoit, DO 10/13/2021 7:08 PM  For on call review [email or web address].

## 2021-10-13 NOTE — Progress Notes (Signed)
   Subjective:    Patient ID: Shelly Jackson, female    DOB: 01-02-1935, 87 y.o.   MRN: [id number]  Diabetes She presents for her follow-up diabetic visit. She has type 2 diabetes mellitus. There are no hypoglycemic associated symptoms. Associated symptoms include fatigue. There are no hypoglycemic complications. There are no diabetic complications. Risk factors for coronary artery disease include hypertension. She does not see a podiatrist.Eye exam is current (still having issues out of left eye).  Hypertension This is a chronic problem. Risk factors for coronary artery disease include diabetes mellitus. There are no compliance problems.    Wetting bed at night due to not being able to get up in time. Pt is unsure of how much insulin she is taking at night.    Review of Systems  Constitutional:  Positive for fatigue.      Objective:   Physical Exam General-in no acute distress Eyes-no discharge Lungs-respiratory rate normal, CTA CV-no murmurs,tachycardia Extremities skin warm dry no edema Neuro grossly normal Behavior normal, alert        Assessment & Plan:  1. Tachycardia Significant tachycardia appears to be atrial flutter flutter with rapid ventricular response.  Talk with patient in detail she agrees to go to ER does not want to go via ambulance carefully she was driven over to the ER and escorted into the triage - EKG 12-Lead  2. Frequent falls Patient becoming more frail I believe she would benefit from someone helping coordinate her medications as well as helping her around the house.  The difficult part is her son works and therefore is not around the house much at all.  The patient has a difficult time telling me about her medicines in states that she has not been able to get her medicines over the past 2 weeks even though she brought in pill bottles.  She also states that she has not been on insulin when she has been in the past so it is very difficult to know if she is  truly taking good care of herself.  I believe she has some early aspects of dementia we will address further after her hospitalization - Ambulatory referral to Social Work  3. Primary hypertension Blood pressure decent control  4. Type 2 diabetes mellitus with diabetic polyneuropathy, unspecified whether long term insulin use (HCC) A1c back in March under good control but currently not on insulin it is quite possible A1c is high currently  5. Moderate nonproliferative diabetic retinopathy without macular edema associated with diabetes mellitus of other type, unspecified laterality ([place]) See specialist on a regular basis  6. Confusion I am concerned about this patient's state of mental health I do not feel she is doing a good job taking care of her self I believe that she will need some extra assistance at home when she gets out of the hospital

## 2021-10-13 NOTE — ED Provider Notes (Signed)
[facility] EMERGENCY DEPARTMENT Provider Note   CSN: [id number] Arrival date & time: 10/13/21  1529     History  Chief Complaint  Patient presents with   Palpitations    Shelly Jackson is a 87 y.o. female.  Patient presents to ER from her primary care doctor's office chief complaint of rapid heart rate.  She states she had a routine appointment today denies any other symptoms.  Denies any headache or chest pain dizziness lightheadedness.  Denies fevers cough vomiting or diarrhea.  She was told her heart rate was rapid and was sent to the ER.      Home Medications Prior to Admission medications   Medication Sig Start Date End Date Taking? Authorizing Provider  Accu-Chek Softclix Lancets lancets Use as instructed 03/27/19   Kathyrn Drown, MD  amLODipine (NORVASC) 2.5 MG tablet TAKE 1 TABLET BY MOUTH  DAILY 01/01/21   Kathyrn Drown, MD  B-D UF III MINI PEN NEEDLES 31G X 5 MM MISC USE TO ADMINISTER INSULIN  AS DIRECTED 01/20/21   Kathyrn Drown, MD  blood glucose meter kit and supplies Dispense based on patient and insurance preference. Use to test blood sugar 3 times a day (FOR ICD-10 E10.9, E11.9). 03/27/19   Kathyrn Drown, MD  busPIRone (BUSPAR) 5 MG tablet TAKE 1 TABLET(5 MG) BY MOUTH TWICE DAILY FOR ANXIETY 05/13/21   Kathyrn Drown, MD  citalopram (CELEXA) 20 MG tablet TAKE 1 TABLET BY MOUTH  DAILY 12/25/20   Sallee Lange A, MD  glucose blood (ONE TOUCH ULTRA TEST) test strip TEST BLOOD SUGAR THREE TIMES DAILY 12/27/18   Sallee Lange A, MD  glucose blood test strip Use as instructed 03/27/19   Kathyrn Drown, MD  insulin glargine (LANTUS SOLOSTAR) 100 UNIT/ML Solostar Pen INJECT SUBCUTANEOUSLY 48  UNITS AT BEDTIME 03/23/21   Kathyrn Drown, MD  Insulin Syringes, Disposable, U-100 1 ML MISC Use to administer insulin 07/31/19   Luking, Elayne Snare, MD  losartan (COZAAR) 50 MG tablet TAKE 1 TABLET BY MOUTH  DAILY 01/01/21   Kathyrn Drown, MD  metFORMIN (GLUCOPHAGE) 1000 MG tablet  TAKE 1 TABLET BY MOUTH IN  THE MORNING AND ONE-HALF  TABLET BY MOUTH AT NIGHT 12/25/20   Kathyrn Drown, MD  potassium chloride (KLOR-CON) 10 MEQ tablet TAKE 1 TABLET BY MOUTH ON  MONDAYS, [facility] AND FRIDA[facility] 06/15/21   Kathyrn Drown, MD  pravastatin (PRAVACHOL) 10 MG tablet Take 1 tablet (10 mg total) by mouth daily. 11/17/20   Kathyrn Drown, MD  triamcinolone cream (KENALOG) 0.1 % Apply thin amount twice a day as needed to help with itching around the umbilicus 95/63/87   Luking, Elayne Snare, MD      Allergies    Daypro [oxaprozin]    Review of Systems   Review of Systems  Constitutional:  Negative for fever.  HENT:  Negative for ear pain.   Eyes:  Negative for pain.  Respiratory:  Negative for cough.   Cardiovascular:  Negative for chest pain.  Gastrointestinal:  Negative for abdominal pain.  Genitourinary:  Negative for flank pain.  Musculoskeletal:  Negative for back pain.  Skin:  Negative for rash.  Neurological:  Negative for headaches.   Physical Exam Updated Vital Signs BP (!) 153/114 (BP Location: Right Arm)   Pulse (!) 145   Temp 97.9 F (36.6 C) (Oral)   Resp 18   Ht '5\' 2"'  (1.575 m)   Wt 91.6  kg   SpO2 95%   BMI 36.94 kg/m  Physical Exam Constitutional:      General: She is not in acute distress.    Appearance: Normal appearance.  HENT:     Head: Normocephalic.     Nose: Nose normal.  Eyes:     Extraocular Movements: Extraocular movements intact.  Cardiovascular:     Rate and Rhythm: Regular rhythm. Tachycardia present.  Pulmonary:     Effort: Pulmonary effort is normal.  Musculoskeletal:        General: Normal range of motion.     Cervical back: Normal range of motion.  Neurological:     General: No focal deficit present.     Mental Status: She is alert. Mental status is at baseline.    ED Results / Procedures / Treatments   Labs (all labs ordered are listed, but only abnormal results are displayed) Labs Reviewed  BASIC METABOLIC PANEL -  Abnormal; Notable for the following components:      Result Value   Glucose, Bld 306 (*)    BUN 30 (*)    Creatinine, Ser 1.17 (*)    Calcium 8.5 (*)    GFR, Estimated 46 (*)    All other components within normal limits  CBC - Abnormal; Notable for the following components:   WBC 11.5 (*)    All other components within normal limits  TROPONIN I (HIGH SENSITIVITY) - Abnormal; Notable for the following components:   Troponin I (High Sensitivity) 48 (*)    All other components within normal limits    EKG EKG Interpretation  Date/Time:  Tuesday October 13 2021 15:43:41 EDT Ventricular Rate:  145 PR Interval:    QRS Duration: 98 QT Interval:  318 QTC Calculation: 493 R Axis:   -60 Text Interpretation: Supraventricular tachycardia Pulmonary disease pattern Left anterior fascicular block Left ventricular hypertrophy ( R in aVL , Cornell product , Romhilt-Estes ) Abnormal ECG When compared with ECG of 22-Jun-2017 14:21, Vent. rate has increased BY  76 BPM Confirmed by Thamas Jaegers ([id number]) on 10/13/2021 4:00:12 PM  Radiology DG Chest Port 1 View  Result Date: 10/13/2021 CLINICAL DATA:  10009. Pt having palpitations that started today, seen at Dr. Lance Sell office and sent her for tachycardia. Hx of CHF EXAM: PORTABLE CHEST 1 VIEW COMPARISON:  Chest x-ray 02/03/2018, chest x-ray 09/24/2010 FINDINGS: Cardiac paddles overlie the chest. Stable prominent cardiac silhouette likely exaggerated by portable AP technique. The heart and mediastinal contours are unchanged. Aortic calcification. Biapical pleural/pulmonary scarring. No focal consolidation. Chronic coarsened markings with no overt pulmonary edema. No pleural effusion. No pneumothorax. No acute osseous abnormality. Degenerative changes of the right shoulder. Redemonstration of a well corticated 1.9 cm calcific density along the left shoulder. IMPRESSION: 1. No active disease. 2.  Aortic Atherosclerosis (ICD10-I70.0). Electronically Signed   By:  Iven Finn M.D.   On: 10/13/2021 16:36    Procedures .Critical Care Performed by: Luna Fuse, MD Authorized by: Luna Fuse, MD   Critical care provider statement:    Critical care time (minutes):  40   Critical care time was exclusive of:  Separately billable procedures and treating other patients and teaching time   Critical care was necessary to treat or prevent imminent or life-threatening deterioration of the following conditions:  Cardiac failure    Medications Ordered in ED Medications  diltiazem (CARDIZEM) 1 mg/mL load via infusion 15 mg (15 mg Intravenous Bolus from Bag 10/13/21 1637)    And  diltiazem (CARDIZEM) 125 mg in dextrose 5% 125 mL (1 mg/mL) infusion (5 mg/hr Intravenous New Bag/Given 10/13/21 1636)  apixaban (ELIQUIS) tablet 5 mg (has no administration in time range)  adenosine (ADENOCARD) 6 MG/2ML injection 6 mg (6 mg Intravenous Given 10/13/21 1615)    ED Course/ Medical Decision Making/ A&P                           Medical Decision Making Amount and/or Complexity of Data Reviewed Labs: ordered. Radiology: ordered.  Risk Prescription drug management.   Chart review shows office visit today with primary care doctor.  Cardiac monitoring shows narrow complex tachycardic rhythm very regular.  Heart rate about 150 bpm.  Sinus studies were sent white count normal hemoglobin normal.  Patient had a attempted Valsalva maneuver x2 with both legs raised.  This was unsuccessful slowing the heart rate.  Patient then given adenosine 6 mg IV.  Successful AV node pause achieved, underlying rhythm appears to be atrial flutter however and flutter waves are very visible.  Subsequently reverted back to narrow complex tachycardia I suspect 1-1 conduction of atrial flutter.  Started on diltiazem IV push and drip.        Final Clinical Impression(s) / ED Diagnoses Final diagnoses:  Atrial flutter with rapid ventricular response [facility])    Rx / DC  Orders ED Discharge Orders     None         Luna Fuse, MD 10/13/21 1718

## 2021-10-13 NOTE — ED Triage Notes (Signed)
Pt having palpitations that started today, seen at Dr. Lance Sell office and sent her for tachycardia.

## 2021-10-14 ENCOUNTER — Observation Stay (HOSPITAL_COMMUNITY): Payer: Medicare Other

## 2021-10-14 ENCOUNTER — Inpatient Hospital Stay (HOSPITAL_COMMUNITY): Payer: Medicare Other

## 2021-10-14 ENCOUNTER — Encounter (HOSPITAL_COMMUNITY): Payer: Self-pay | Admitting: Internal Medicine

## 2021-10-14 DIAGNOSIS — B9620 Unspecified Escherichia coli [E. coli] as the cause of diseases classified elsewhere: Secondary | ICD-10-CM | POA: Diagnosis present

## 2021-10-14 DIAGNOSIS — Z7984 Long term (current) use of oral hypoglycemic drugs: Secondary | ICD-10-CM | POA: Diagnosis not present

## 2021-10-14 DIAGNOSIS — E860 Dehydration: Secondary | ICD-10-CM | POA: Diagnosis present

## 2021-10-14 DIAGNOSIS — I10 Essential (primary) hypertension: Secondary | ICD-10-CM | POA: Diagnosis present

## 2021-10-14 DIAGNOSIS — I480 Paroxysmal atrial fibrillation: Secondary | ICD-10-CM | POA: Diagnosis not present

## 2021-10-14 DIAGNOSIS — K219 Gastro-esophageal reflux disease without esophagitis: Secondary | ICD-10-CM | POA: Diagnosis present

## 2021-10-14 DIAGNOSIS — I4891 Unspecified atrial fibrillation: Secondary | ICD-10-CM | POA: Diagnosis present

## 2021-10-14 DIAGNOSIS — R4182 Altered mental status, unspecified: Secondary | ICD-10-CM | POA: Diagnosis not present

## 2021-10-14 DIAGNOSIS — E1169 Type 2 diabetes mellitus with other specified complication: Secondary | ICD-10-CM | POA: Diagnosis not present

## 2021-10-14 DIAGNOSIS — Z6836 Body mass index (BMI) 36.0-36.9, adult: Secondary | ICD-10-CM

## 2021-10-14 DIAGNOSIS — Z79899 Other long term (current) drug therapy: Secondary | ICD-10-CM | POA: Diagnosis not present

## 2021-10-14 DIAGNOSIS — E876 Hypokalemia: Secondary | ICD-10-CM

## 2021-10-14 DIAGNOSIS — E669 Obesity, unspecified: Secondary | ICD-10-CM | POA: Diagnosis present

## 2021-10-14 DIAGNOSIS — Z87442 Personal history of urinary calculi: Secondary | ICD-10-CM | POA: Diagnosis not present

## 2021-10-14 DIAGNOSIS — E039 Hypothyroidism, unspecified: Secondary | ICD-10-CM | POA: Diagnosis present

## 2021-10-14 DIAGNOSIS — Z8249 Family history of ischemic heart disease and other diseases of the circulatory system: Secondary | ICD-10-CM | POA: Diagnosis not present

## 2021-10-14 DIAGNOSIS — N390 Urinary tract infection, site not specified: Secondary | ICD-10-CM | POA: Diagnosis present

## 2021-10-14 DIAGNOSIS — G9341 Metabolic encephalopathy: Secondary | ICD-10-CM

## 2021-10-14 DIAGNOSIS — F419 Anxiety disorder, unspecified: Secondary | ICD-10-CM | POA: Diagnosis present

## 2021-10-14 DIAGNOSIS — E1165 Type 2 diabetes mellitus with hyperglycemia: Secondary | ICD-10-CM | POA: Diagnosis present

## 2021-10-14 DIAGNOSIS — R778 Other specified abnormalities of plasma proteins: Secondary | ICD-10-CM | POA: Diagnosis not present

## 2021-10-14 DIAGNOSIS — I484 Atypical atrial flutter: Secondary | ICD-10-CM | POA: Diagnosis present

## 2021-10-14 DIAGNOSIS — N179 Acute kidney failure, unspecified: Secondary | ICD-10-CM | POA: Diagnosis present

## 2021-10-14 DIAGNOSIS — Z22322 Carrier or suspected carrier of Methicillin resistant Staphylococcus aureus: Secondary | ICD-10-CM | POA: Diagnosis not present

## 2021-10-14 DIAGNOSIS — Z794 Long term (current) use of insulin: Secondary | ICD-10-CM | POA: Diagnosis not present

## 2021-10-14 DIAGNOSIS — I248 Other forms of acute ischemic heart disease: Secondary | ICD-10-CM | POA: Diagnosis present

## 2021-10-14 DIAGNOSIS — I4892 Unspecified atrial flutter: Secondary | ICD-10-CM

## 2021-10-14 LAB — PHOSPHORUS: Phosphorus: 2.6 mg/dL (ref 2.5–4.6)

## 2021-10-14 LAB — URINALYSIS, ROUTINE W REFLEX MICROSCOPIC
Bilirubin Urine: NEGATIVE
Glucose, UA: NEGATIVE mg/dL
Ketones, ur: NEGATIVE mg/dL
Nitrite: NEGATIVE
Protein, ur: 30 mg/dL — AB
RBC / HPF: >50 RBC/hpf — ABNORMAL HIGH (ref 0–5)
Specific Gravity, Urine: 1.012 (ref 1.005–1.030)
pH: 7 (ref 5.0–8.0)

## 2021-10-14 LAB — COMPREHENSIVE METABOLIC PANEL
ALT: 35 U/L (ref 0–44)
AST: 20 U/L (ref 15–41)
Albumin: 3.4 g/dL — ABNORMAL LOW (ref 3.5–5.0)
Alkaline Phosphatase: 88 U/L (ref 38–126)
Anion gap: 6 (ref 5–15)
BUN: 24 mg/dL — ABNORMAL HIGH (ref 8–23)
CO2: 24 mmol/L (ref 22–32)
Calcium: 8.1 mg/dL — ABNORMAL LOW (ref 8.9–10.3)
Chloride: 111 mmol/L (ref 98–111)
Creatinine, Ser: 0.77 mg/dL (ref 0.44–1.00)
GFR, Estimated: >60 mL/min (ref >60)
Glucose, Bld: 173 mg/dL — ABNORMAL HIGH (ref 70–99)
Potassium: 3.4 mmol/L — ABNORMAL LOW (ref 3.5–5.1)
Sodium: 141 mmol/L (ref 135–145)
Total Bilirubin: 0.7 mg/dL (ref 0.3–1.2)
Total Protein: 6.4 g/dL — ABNORMAL LOW (ref 6.5–8.1)

## 2021-10-14 LAB — ECHOCARDIOGRAM COMPLETE
AR max vel: 1.88 cm2
AV Area VTI: 2.01 cm2
AV Area mean vel: 1.66 cm2
AV Mean grad: 5 mmHg
AV Peak grad: 9.4 mmHg
Ao pk vel: 1.53 m/s
Area-P 1/2: 4.06 cm2
Calc EF: 54 %
Height: 63 in
MV VTI: 1.57 cm2
S' Lateral: 3.1 cm
Single Plane A2C EF: 55.8 %
Single Plane A4C EF: 51.2 %
Weight: 3273.39 oz

## 2021-10-14 LAB — HEMOGLOBIN A1C
Hgb A1c MFr Bld: 8.3 % — ABNORMAL HIGH (ref 4.8–5.6)
Mean Plasma Glucose: 191.51 mg/dL

## 2021-10-14 LAB — CBC
HCT: 41.3 % (ref 36.0–46.0)
Hemoglobin: 13.1 g/dL (ref 12.0–15.0)
MCH: 29.8 pg (ref 26.0–34.0)
MCHC: 31.7 g/dL (ref 30.0–36.0)
MCV: 93.9 fL (ref 80.0–100.0)
Platelets: 224 10*3/uL (ref 150–400)
RBC: 4.4 MIL/uL (ref 3.87–5.11)
RDW: 14.6 % (ref 11.5–15.5)
WBC: 9.5 10*3/uL (ref 4.0–10.5)
nRBC: 0 % (ref 0.0–0.2)

## 2021-10-14 LAB — GLUCOSE, CAPILLARY
Glucose-Capillary: 156 mg/dL — ABNORMAL HIGH (ref 70–99)
Glucose-Capillary: 273 mg/dL — ABNORMAL HIGH (ref 70–99)
Glucose-Capillary: 186 mg/dL — ABNORMAL HIGH (ref 70–99)
Glucose-Capillary: 141 mg/dL — ABNORMAL HIGH (ref 70–99)

## 2021-10-14 LAB — TSH: TSH: 8.779 u[IU]/mL — ABNORMAL HIGH (ref 0.350–4.500)

## 2021-10-14 LAB — MAGNESIUM: Magnesium: 1.9 mg/dL (ref 1.7–2.4)

## 2021-10-14 LAB — MRSA NEXT GEN BY PCR, NASAL: MRSA by PCR Next Gen: DETECTED — AB

## 2021-10-14 MED ORDER — METOPROLOL TARTRATE 25 MG PO TABS
25.0000 mg | ORAL_TABLET | Freq: Two times a day (BID) | ORAL | Status: DC
  Filled 2021-10-14: qty 1

## 2021-10-14 MED ORDER — HYDRALAZINE HCL 20 MG/ML IJ SOLN
10.0000 mg | Freq: Once | INTRAMUSCULAR | Status: AC
  Administered 2021-10-14: 10 mg via INTRAVENOUS
  Filled 2021-10-14: qty 1

## 2021-10-14 MED ORDER — DILTIAZEM HCL-DEXTROSE 125-5 MG/125ML-% IV SOLN (PREMIX)
5.0000 mg/h | INTRAVENOUS | Status: DC
  Administered 2021-10-14 – 2021-10-15 (×2): 15 mg/h via INTRAVENOUS
  Filled 2021-10-14 (×2): qty 125

## 2021-10-14 MED ORDER — LEVOTHYROXINE SODIUM 50 MCG PO TABS
50.0000 ug | ORAL_TABLET | Freq: Every day | ORAL | Status: DC
  Administered 2021-10-15 – 2021-10-19 (×5): 50 ug via ORAL
  Filled 2021-10-14: qty 2
  Filled 2021-10-14: qty 1
  Filled 2021-10-14 (×2): qty 2

## 2021-10-14 MED ORDER — LORAZEPAM 2 MG/ML IJ SOLN
1.0000 mg | Freq: Once | INTRAMUSCULAR | Status: AC
  Administered 2021-10-14: 1 mg via INTRAVENOUS
  Filled 2021-10-14: qty 1

## 2021-10-14 MED ORDER — IPRATROPIUM-ALBUTEROL 0.5-2.5 (3) MG/3ML IN SOLN
3.0000 mL | RESPIRATORY_TRACT | Status: DC | PRN
  Administered 2021-10-14: 3 mL via RESPIRATORY_TRACT

## 2021-10-14 MED ORDER — INSULIN GLARGINE-YFGN 100 UNIT/ML ~~LOC~~ SOLN
12.0000 [IU] | Freq: Every day | SUBCUTANEOUS | Status: DC
  Administered 2021-10-14 – 2021-10-18 (×5): 12 [IU] via SUBCUTANEOUS
  Filled 2021-10-14 (×6): qty 0.12

## 2021-10-14 MED ORDER — POTASSIUM CHLORIDE CRYS ER 20 MEQ PO TBCR
40.0000 meq | EXTENDED_RELEASE_TABLET | Freq: Once | ORAL | Status: DC
  Filled 2021-10-14: qty 2

## 2021-10-14 MED ORDER — CITALOPRAM HYDROBROMIDE 20 MG PO TABS
20.0000 mg | ORAL_TABLET | Freq: Every day | ORAL | Status: DC
  Administered 2021-10-14 – 2021-10-18 (×5): 20 mg via ORAL
  Filled 2021-10-14 (×5): qty 1

## 2021-10-14 MED ORDER — METOPROLOL TARTRATE 5 MG/5ML IV SOLN
5.0000 mg | Freq: Four times a day (QID) | INTRAVENOUS | Status: DC
  Administered 2021-10-14 – 2021-10-15 (×3): 5 mg via INTRAVENOUS
  Filled 2021-10-14 (×3): qty 5

## 2021-10-14 MED ORDER — IPRATROPIUM-ALBUTEROL 0.5-2.5 (3) MG/3ML IN SOLN
RESPIRATORY_TRACT | Status: AC
  Filled 2021-10-14: qty 3

## 2021-10-14 MED ORDER — DILTIAZEM HCL 30 MG PO TABS
90.0000 mg | ORAL_TABLET | Freq: Three times a day (TID) | ORAL | Status: DC
Start: 2021-10-14 — End: 2021-10-15
  Filled 2021-10-14 (×2): qty 1

## 2021-10-14 MED ORDER — BUSPIRONE HCL 5 MG PO TABS
5.0000 mg | ORAL_TABLET | Freq: Two times a day (BID) | ORAL | Status: DC
  Administered 2021-10-14 – 2021-10-19 (×10): 5 mg via ORAL
  Filled 2021-10-14 (×11): qty 1

## 2021-10-14 MED ORDER — LORAZEPAM 2 MG/ML IJ SOLN
1.0000 mg | INTRAMUSCULAR | Status: DC | PRN
  Administered 2021-10-14 – 2021-10-16 (×2): 1 mg via INTRAVENOUS
  Filled 2021-10-14 (×2): qty 1

## 2021-10-14 NOTE — Assessment & Plan Note (Addendum)
-  Repleted and is stable at discharge -Magnesium within normal limits -Goal is for potassium above 4 and magnesium above 2.

## 2021-10-14 NOTE — Progress Notes (Signed)
Patient having bp's with systolic over 299. MD made aware via secure chat. She ordered one dose of hydralazine '10mg'$  iv. I also went up on her cardizem drip to a rate of 7.'5mg'$ /hr

## 2021-10-14 NOTE — Assessment & Plan Note (Addendum)
Continue PPI ?

## 2021-10-14 NOTE — Assessment & Plan Note (Addendum)
-  A1c 8.3 -Continue the use of metformin twice a day and long-acting insulin nightly. -Continue to follow CBGs with further adjustment to hypoglycemia regimen as needed -Following modified carbohydrate diet

## 2021-10-14 NOTE — Progress Notes (Signed)
Patient very confused and agitated, attempted to redirect patient several times. Contacted patient's son to see if he would be able to come sit with her, son is 4hrs away and son who is local did not answer the phone. Patients son stated that he would try to contact his brother as well and see if he could come down and sit with patient. Patient helped up to chair, chair alarm in use, and attempted to redirect patient again. MD notified of patient condition.

## 2021-10-14 NOTE — Evaluation (Signed)
Physical Therapy Evaluation Patient Details Name: Shelly Jackson MRN: [id number] DOB: 08-02-1934 Today's Date: 10/14/2021  History of Present Illness  DARREN Jackson is an 87 y.o. female with medical history significant of GERD, T2DM, hypertension, hyperlipidemia who presents to the emergency department from her PCPs office due to palpitations.  Patient states that she went for a routine checkup at her primary care's office and she was noted to have an increased heart rate (150s), so she was sent to the ED for further evaluation.  Patient denies chest pain, shortness of breath, headache, fever, chills, nausea, vomiting, abdominal pain, blurry vision or lightheadedness.   Clinical Impression  Patient presents supine in bed with consent to participate in physical therapy. Patient is min assist with bed mobility and transfers with generalized weakness and decreased trunk control observed. Patient uses rollator for transfers with balance deficits observed and need of verbal cues for good technical form for safe transfers. Patient able to ambulate 15 feet in room with min guard/assist and use of rollator. Patient demonstrates shuffling gait pattern with reliance on AD for balance. Patient mainly limited by fatigue and general weakness during gait assessment. Vitals monitored during evaluation with HR reaching 140 during gait. Patient left in chair with vitals reaching appropriate levels.Patient will benefit from continued skilled physical therapy in hospital and recommended venue below to increase strength, balance, endurance for safe ADLs and gait.      Recommendations for follow up therapy are one component of a multi-disciplinary discharge planning process, led by the attending physician.  Recommendations may be updated based on patient status, additional functional criteria and insurance authorization.  Follow Up Recommendations Skilled nursing-short term rehab (<3 hours/day)    Assistance Recommended  at Discharge Set up Supervision/Assistance  Patient can return home with the following  A little help with walking and/or transfers;Help with stairs or ramp for entrance;A little help with bathing/dressing/bathroom;Assistance with cooking/housework    Equipment Recommendations None recommended by PT  Recommendations for Other Services       Functional Status Assessment Patient has had a recent decline in their functional status and demonstrates the ability to make significant improvements in function in a reasonable and predictable amount of time.     Precautions / Restrictions Precautions Precautions: Fall Restrictions Weight Bearing Restrictions: No      Mobility  Bed Mobility Overal bed mobility: Needs Assistance Bed Mobility: Supine to Sit     Supine to sit: Min assist     General bed mobility comments: Patient is min assist with supine to sit due to UE weakness and decreased trunk control.    Transfers Overall transfer level: Needs assistance Equipment used: Rollator (4 wheels) Transfers: Sit to/from Stand, Bed to chair/wheelchair/BSC Sit to Stand: Min assist   Step pivot transfers: Min assist       General transfer comment: Patient is min assist with transfers with rollator. Balance deficits observed with verbal cues needed for good technical form for performing transfer.    Ambulation/Gait Ambulation/Gait assistance: Min guard, Min assist Gait Distance (Feet): 15 Feet Assistive device: Rollator (4 wheels) Gait Pattern/deviations: Shuffle, Decreased step length - right, Decreased step length - left, Step-to pattern, Decreased stride length, Decreased weight shift to right, Decreased weight shift to left Gait velocity: decreased     General Gait Details: Patient was able to ambulate for 15 steps in room with min guard/assist and use of rollator. Patient has a shuffling gait pattern with minor balance deficits observed. Vitals monitored.  Stairs             Wheelchair Mobility    Modified Rankin (Stroke Patients Only)       Balance Overall balance assessment: Needs assistance Sitting-balance support: Bilateral upper extremity supported, Feet supported Sitting balance-Leahy Scale: Good Sitting balance - Comments: Patient able to sit EOB with UE support   Standing balance support: Bilateral upper extremity supported, During functional activity, Reliant on assistive device for balance Standing balance-Leahy Scale: Fair Standing balance comment: Balance deficits observed with patient demonstrating narrow stance and shuffling with reaching outside BOS.                             Pertinent Vitals/Pain Pain Assessment Pain Assessment: No/denies pain    Home Living Family/patient expects to be discharged to:: Private residence Living Arrangements: Children Available Help at Discharge: Available PRN/intermittently Type of Home: House Home Access: Ramped entrance       Home Layout: One level Home Equipment: Rollator (4 wheels);Wheelchair - manual;Grab bars - tub/shower;Shower seat;Grab bars - toilet;Cane - single point      Prior Function Prior Level of Function : Needs assist       Physical Assist : ADLs (physical)   ADLs (physical): IADLs Mobility Comments: Patient reports ambulating at home with rollator but is a limited with ambulation in the community. Patient drives sometimes and drove herself to this hospital ADLs Comments: Patient reports being able to complete some ADL's independently but relies on assist from children for help.     Hand Dominance        Extremity/Trunk Assessment   Upper Extremity Assessment Upper Extremity Assessment: Generalized weakness    Lower Extremity Assessment Lower Extremity Assessment: Generalized weakness    Cervical / Trunk Assessment Cervical / Trunk Assessment: Normal  Communication   Communication: No difficulties  Cognition Arousal/Alertness:  Awake/alert Behavior During Therapy: WFL for tasks assessed/performed Overall Cognitive Status: Within Functional Limits for tasks assessed                                          General Comments      Exercises     Assessment/Plan    PT Assessment Patient needs continued PT services;All further PT needs can be met in the next venue of care  PT Problem List Decreased strength;Decreased activity tolerance;Decreased balance;Decreased mobility;Decreased coordination       PT Treatment Interventions DME instruction;Gait training;Functional mobility training;Therapeutic activities;Therapeutic exercise;Balance training    PT Goals (Current goals can be found in the Care Plan section)  Acute Rehab PT Goals Patient Stated Goal: return home PT Goal Formulation: With patient Time For Goal Achievement: 10/14/21 Potential to Achieve Goals: Good    Frequency Min 3X/week     Co-evaluation               AM-PAC PT "6 Clicks" Mobility  Outcome Measure Help needed turning from your back to your side while in a flat bed without using bedrails?: A Little Help needed moving from lying on your back to sitting on the side of a flat bed without using bedrails?: A Little Help needed moving to and from a bed to a chair (including a wheelchair)?: A Little Help needed standing up from a chair using your arms (e.g., wheelchair or bedside chair)?: A Lot Help needed to walk in hospital  room?: A Lot Help needed climbing 3-5 steps with a railing? : A Lot 6 Click Score: 15    End of Session   Activity Tolerance: Patient tolerated treatment well;Patient limited by fatigue;No increased pain Patient left: in chair;with call bell/phone within reach Nurse Communication: Mobility status PT Visit Diagnosis: Unsteadiness on feet (R26.81);Other abnormalities of gait and mobility (R26.89);Muscle weakness (generalized) (M62.81)    Time: 5025-6154 PT Time Calculation (min) (ACUTE  ONLY): 29 min   Charges:   PT Evaluation $PT Eval Moderate Complexity: 1 Mod PT Treatments $Therapeutic Activity: 23-37 mins        12:26 PM, 10/14/21 Lestine Box, S/PT

## 2021-10-14 NOTE — Plan of Care (Signed)
  Problem: Clinical Measurements: Goal: Diagnostic test results will improve Outcome: Progressing   

## 2021-10-14 NOTE — Progress Notes (Signed)
Inpatient Diabetes Program Recommendations  AACE/ADA: New Consensus Statement on Inpatient Glycemic Control (2015)  Target Ranges:  Prepandial:   less than 140 mg/dL      Peak postprandial:   less than 180 mg/dL (1-2 hours)      Critically ill patients:  140 - 180 mg/dL   Lab Results  Component Value Date   GLUCAP 273 (H) 10/14/2021   HGBA1C 6.4 (H) 07/13/2021    Review of Glycemic Control  Latest Reference Range & Units 10/14/21 07:31 10/14/21 11:10  Glucose-Capillary 70 - 99 mg/dL 156 (H) 273 (H)  (H): Data is abnormally high  Diabetes history: DM2 Outpatient Diabetes medications: lantus 48 units QD, Metformin 1000 mg QAM and 500 mg QHS Current orders for Inpatient glycemic control: Semglee 10 units QHS, Novolog 0-9 units TID and 0-5 QHS  Inpatient Diabetes Program Recommendations:    If postprandials consistently elevated, please consider:  Novolog 3 units TID with meals if consumes at least 50%.  Will continue to follow while inpatient.  Thank you, Reche Dixon, MSN, [place] Diabetes Coordinator Inpatient Diabetes Program [phone] (team pager from 8a-5p)

## 2021-10-14 NOTE — Assessment & Plan Note (Addendum)
-  In the setting of prerenal azotemia most likely -Improved/resolved after fluid resuscitation -continue to follow renal function trend intermittently. -patient advised to maintain adequate hydration.

## 2021-10-14 NOTE — Assessment & Plan Note (Addendum)
-  Stabilize electrolytes (goal is for potassium above 4 and magnesium above 2) -Minimize medications that can further prolong QT -Continue telemetry monitoring.

## 2021-10-14 NOTE — Progress Notes (Addendum)
Progress Note   Patient: Shelly Jackson WRU:[id number] DOB: 1934/05/26 DOA: 10/13/2021     0 DOS: the patient was seen and examined on 10/14/2021   Brief hospital course: As per H&P written by Dr. Josephine Cables on 10/13/2021 Shelly Jackson is an 87 y.o. female with medical history significant of GERD, T2DM, hypertension, hyperlipidemia who presents to the emergency department from her PCPs office due to palpitations.  Patient states that she went for a routine checkup at her primary care's office and she was noted to have an increased heart rate (150s), so she was sent to the ED for further evaluation.  Patient denies chest pain, shortness of breath, headache, fever, chills, nausea, vomiting, abdominal pain, blurry vision or lightheadedness.   ED Course:  In the emergency department, she was intermittently tachypneic and was initially tachycardic.  BP was 162/114, temperature 96.2F and O2 sat was 95% on room air.  Work-up in the ED showed normal CBC except for leukocytosis, and normal BMP except for hyperglycemia and BUN/creatinine 30/1.17 (baseline creatinine 0.7 x 0.9).  Troponin x1 - 48. Chest x-ray showed no active disease Patient was noted to be in atrial flutter and IV adenosine was initially given.  She was subsequently given IV loading dose of Cardizem and subsequently placed on IV Cardizem drip.  Hospitalist was asked to admit patient for further evaluation and management.  Assessment and Plan: * Atrial flutter with rapid ventricular response [facility]) - Cardiology service consulted -Will attempt to transition off Cardizem drip and use Lopressor -Checking TSH and 2D echo. -Patient denies chest pain, shortness of breath, nausea/or vomiting.  Reports intermittent palpitations mainly with exertion. -CHADS score 3, started on Eliquis for secondary prevention.  Obesity - Low calorie diet and portion control discussed with patient -Body mass index is 36.24 kg/m.   Acute metabolic encephalopathy -  Given presentation with atrial flutter/A-fib CT scan without contrast to rule out intracranial abnormalities will be ordered. -Will also check urinalysis -Continue constant reorientation and resume home psych medications.  GERD (gastroesophageal reflux disease) - Continue PPI.  Elevated troponin - In the setting of demand ischemia from atrial flutter/A-fib with RVR. -Patient denies chest pain.  Prolonged QT interval - Stabilize electrolytes (goal is for potassium above 4 and magnesium above 2) -Minimize medications that can further prolong QT -Continue telemetry monitoring.  Leukocytosis - Appears to be in the setting of demargination -No clear signs of acute infection appreciated -Fluid resuscitation were provided -Will follow WBCs trend.  Type 2 diabetes mellitus with hyperglycemia (HCC) - A1c will be updated -Continue sliding scale insulin and Semglee while inpatient -Follow CBGs fluctuation with further adjustment to hypoglycemia regimen as required.  AKI (acute kidney injury) ([place]) - In the setting of prerenal azotemia most likely -Improved/resolved after fluid resuscitation -Current creatinine 0.7  -continue to follow renal function trend.  Hypokalemia - Follow electrolytes and replete as needed -Will check magnesium level.  Essential hypertension - Elevated blood pressure appreciated on today's examination -Continue Cardizem and initiate Lopressor -As needed hydralazine has been ordered -Heart healthy/low-sodium diet emphasized. -Continue to follow vital signs.   Subjective:  Afebrile, no chest pain, no nausea, no vomiting.  Reports intermittent palpitation feelings especially with activity and has also demonstrated ongoing agitation and confusion.  Physical Exam: Vitals:   10/14/21 0700 10/14/21 0744 10/14/21 1122 10/14/21 1624  BP: (!) 156/44     Pulse: (!) 51     Resp: (!) 33     Temp:  (!) 97.4 F (  36.3 C) (!) 97.4 F (36.3 C) (!) 97.1 F (36.2 C)   TempSrc:  Axillary Oral Oral  SpO2: 99%     Weight:      Height:       General exam: Alert, awake, oriented x 2 (person and place intermittently); no chest pain, no Respiratory system: Clear to auscultation. Respiratory effort normal.  No using accessory muscles. Cardiovascular system: No rubs or gallops; irregular irregular. Gastrointestinal system: Abdomen is obese, nondistended, soft and nontender. No organomegaly or masses felt. Normal bowel sounds heard. Central nervous system: No focal neurological deficits. Extremities: No cyanosis or clubbing; trace edema appreciated bilaterally. Skin: No petechiae. Psychiatry: Judgement and insight appear in the setting of acute metabolic encephalopathy.  Data Reviewed: Comprehensive metabolic panel demonstrating sodium 141, potassium 3.4, BUN 24, creatinine 0.77 CBC with WBCs of 9.5, hemoglobin 13.1 platelet count 224 K Magnesium 1.9 and phosphorus 2.6 MRSA by PCR nasal; detected.  Family Communication: No family at bedside.  Disposition: Status is: Inpatient Remains inpatient appropriate because: Ongoing acute metabolic encephalopathy with atrial flutter with RVR.   Planned Discharge Destination: Home with Home Health   Author: Barton Dubois, MD 10/14/2021 5:36 PM  For on call review [email or web address].

## 2021-10-14 NOTE — Progress Notes (Signed)
*  PRELIMINARY RESULTS* Echocardiogram 2D Echocardiogram has been performed.  Shelly Jackson 10/14/2021, 3:21 PM

## 2021-10-14 NOTE — Progress Notes (Signed)
Sys BP is back up over 200 again tonight. MD made aware via secure chat. Po lopressor switched to IV. Cardizem drip is at max rate.  HR 70

## 2021-10-14 NOTE — TOC Initial Note (Signed)
Transition of Care [facility]) - Initial/Assessment Note    Patient Details  Name: Shelly Jackson MRN: [id number] Date of Birth: 06-15-1934  [facility] of [facility]) CM/SW Contact:    Iona Beard, [place] Phone Number: 10/14/2021, 11:01 AM  Clinical Narrative:                 CSW updated that PT is recommending SNF for pt at D/C. CSW spoke in room with pt about this. Pt states that she would rather go home with [facility] services and does not wish to go to SNF at D/C. Pt states that her son lives with her and will be able to assist as needed. Pt has a rollator and wheelchair to use in the home when needed. Pt states that she has had HH in the past and would like this set up. CSW sent referral to [facility] rep, awaiting response. TOC to follow.   Expected Discharge Plan: [place] Barriers to Discharge: Continued Medical Work up   Patient Goals and CMS Choice Patient states their goals for this hospitalization and ongoing recovery are:: home with [facility] CMS Medicare.gov Compare Post Acute Care list provided to:: Patient Choice offered to / list presented to : Patient  Expected Discharge Plan and Services Expected Discharge Plan: [place] In-house Referral: Clinical Social Work   Post Acute Care Choice: [place] arrangements for the past 2 months: [place]                                      Prior Living Arrangements/Services Living arrangements for the past 2 months: Single Family Home Lives with:: Adult Children Patient language and need for interpreter reviewed:: Yes Do you feel safe going back to the place where you live?: Yes      Need for Family Participation in Patient Care: Yes (Comment) Care giver support system in place?: Yes (comment) Current home services: DME Criminal Activity/Legal Involvement Pertinent to Current Situation/Hospitalization: No - Comment as needed  Activities of Daily Living Home Assistive  Devices/Equipment: Walker (specify type), CBG Meter ADL Screening (condition at time of admission) Patient's cognitive ability adequate to safely complete daily activities?: Yes Is the patient deaf or have difficulty hearing?: No Does the patient have difficulty seeing, even when wearing glasses/contacts?: No Does the patient have difficulty concentrating, remembering, or making decisions?: No Patient able to express need for assistance with ADLs?: Yes Does the patient have difficulty dressing or bathing?: No Independently performs ADLs?: Yes (appropriate for developmental age) Communication: Independent Dressing (OT): Independent Grooming: Independent Feeding: Independent Bathing: Independent Toileting: Independent In/Out Bed: Independent Walks in Home: Independent with device (comment) Does the patient have difficulty walking or climbing stairs?: Yes Weakness of Legs: Both Weakness of Arms/Hands: None  Permission Sought/Granted                  Emotional Assessment Appearance:: Appears stated age Attitude/Demeanor/Rapport: Engaged Affect (typically observed): Accepting Orientation: : Oriented to Self, Oriented to Place, Oriented to Situation, Oriented to  Time Alcohol / Substance Use: Not Applicable Psych Involvement: No (comment)  Admission diagnosis:  Atrial flutter with rapid ventricular response [facility]) [I48.92] Patient Active Problem List   Diagnosis Date Noted   Atrial flutter with rapid ventricular response ([place]) 10/13/2021   Atrial fibrillation, new onset ([place]) 10/13/2021   AKI (acute kidney injury) ([place]) 10/13/2021  Type 2 diabetes mellitus with hyperglycemia (HCC) 10/13/2021   Leukocytosis 10/13/2021   Prolonged QT interval 10/13/2021   Elevated troponin 10/13/2021   GERD (gastroesophageal reflux disease) 10/13/2021   Personal history of fall 07/15/2020   Hypokalemia 07/15/2020   Proliferative diabetic retinopathy of left eye with macular edema associated  with type 2 diabetes mellitus ([place]) 11/19/2019   Retinal hemorrhage, bilateral 11/19/2019   Diabetic macular edema of right eye with proliferative retinopathy associated with type 2 diabetes mellitus ([place]) 11/19/2019   Adrenal adenoma 02/18/2017   Aortic atherosclerosis ([place]) 02/18/2017   Kidney stones 02/18/2017   Hematuria 02/18/2017   Diabetic retinopathy ([place]) 12/24/2014   Diabetic peripheral neuropathy ([place]) 04/25/2014   Essential hypertension 04/10/2013   Hyperlipemia, mixed 11/28/2012   Thrombocytopenia ([place]) 07/26/2011   Diverticulitis 07/24/2011   Polyp of colon 07/24/2011   Lower gastrointestinal bleed 07/23/2011   Acute blood loss anemia 07/23/2011   Sinus bradycardia 07/23/2011   Obesity 07/23/2011   DM type 2 (diabetes mellitus, type 2) ([place]) 07/23/2011   PCP:  Kathyrn Drown, MD Pharmacy:   [facility] [place], [place] - [address] [place] [address] Phone: [phone] Fax: [phone]  [facility] ([facility] ) - [place], [place] - [address] [place] [address] Phone: [phone] Fax: [phone]     Social Determinants of Health (SDOH) Interventions    Readmission Risk Interventions     View : No data to display.

## 2021-10-14 NOTE — Assessment & Plan Note (Addendum)
-   Given presentation with atrial flutter/A-fib CT scan without contrast has rule out acute bleeding abnormalities. -MRI also has demonstrated no acute stroke. -Urinalysis with concern for UTI; empirically treating with Rocephin; will transition to cefdinir to complete therapy. -Follow clinical response. -Continue to minimize sedative agents and the use of psychotropic medications. -Constant reorientation to be provided. -oriented X 3 and following commands appropriately.

## 2021-10-14 NOTE — Assessment & Plan Note (Addendum)
-  Appears to be in the setting of demargination -Patient urinalysis with concern for UTI. -Continue the use of Rocephin and follow urine culture results. -Continue to follow WBCs trend; now WNL 8.3 -Continue to maintain adequate hydration and follow clinical response.

## 2021-10-14 NOTE — Consult Note (Signed)
CARDIOLOGY CONSULT NOTE       Patient ID: Shelly Jackson MRN: [id number] DOB/AGE: December 26, 1934 87 y.o.  Admit date: 10/13/2021 Referring Physician: Dyann Kief Primary Physician: Kathyrn Drown, MD Primary Cardiologist: New Reason for Consultation: Atrial fibrillation   Principal Problem:   Atrial flutter with rapid ventricular response ([place]) Active Problems:   Obesity   Essential hypertension   Atrial fibrillation, new onset ([place])   AKI (acute kidney injury) ([place])   Type 2 diabetes mellitus with hyperglycemia (HCC)   Leukocytosis   Prolonged QT interval   Elevated troponin   GERD (gastroesophageal reflux disease)   HPI:  87 y.o. admitted with palpitations from primary office She is currently agitated and confused and unable to give history She was noted to be in afib/flutter Rx with eliquis and iv cardizem Rate control more difficult with agitation and trying to get OOB>  No bleeding issues Has not had head CT/MRI to r/o CVA  No history of cardiac issues I just read her TTE and has moderate LAE with normal EF and no significant valve dx.  She is a diabetic BS is ok TSH is 8.9 and she is not on home replacement   ROS All other systems reviewed and negative except as noted above  Past Medical History:  Diagnosis Date   Acid reflux    Anemia    Secondary to acute blood loss   Anxiety    Arthritis    RA   CHF (congestive heart failure) (HCC)    Diabetes mellitus    Diabetic retinopathy ([place])    Diverticulitis 07/24/11   Inpatient [facility]   History of kidney stones    Hypertension    Obesity    Rheumatoid arthritis(714.0)    Sleep apnea    Thrombocytopenia (HCC)    Tubular adenoma of colon 07/24/11    Family History  Problem Relation Age of Onset   Coronary artery disease Father    Heart attack Father    Cirrhosis Mother    Hypertension Mother    Colon cancer Neg Hx     Social History   Socioeconomic History   Marital status: Widowed    Spouse name: Not on file    Number of children: 2   Years of education: Not on file   Highest education level: Not on file  Occupational History   Occupation: Retired    Fish farm manager: RETIRED  Tobacco Use   Smoking status: Never   Smokeless tobacco: Never   Tobacco comments:    Never  Vaping Use   Vaping Use: Never used  Substance and Sexual Activity   Alcohol use: No   Drug use: No   Sexual activity: Never    Birth control/protection: None  Other Topics Concern   Not on file  Social History Narrative   Not on file   Social Determinants of Health   Financial Resource Strain: Not on file  Food Insecurity: No Food Insecurity   Worried About Running Out of Food in the Last Year: Never true   [place] in the Last Year: Never true  Transportation Needs: No Transportation Needs   Lack of Transportation (Medical): No   Lack of Transportation (Non-Medical): No  Physical Activity: Not on file  Stress: Not on file  Social Connections: Moderately Isolated   Frequency of Communication with Friends and Family: Once a week   Frequency of Social Gatherings with Friends and Family: Once a week   Attends Religious Services: 1  to 4 times per year   Active Member of Clubs or Organizations: Yes   Attends Archivist Meetings: 1 to 4 times per year   Marital Status: Widowed  Intimate Partner Violence: Not on file    Past Surgical History:  Procedure Laterality Date   APPENDECTOMY     CATARACT EXTRACTION Bilateral (right 08/2014) ( left 2005)   CHOLECYSTECTOMY     COLONOSCOPY  07/24/2011   Dr. Deatra Ina MM polyp at proximal ascending colon could not be removed because of difficult approach, pancolonic diverticulosis with diverticulitis involving the ascending colon, and suspected diverticular bleed, 7 mm polyp snared from proximal sigmoid colon with hemorrhagic surface found to be a tubular adenoma, small external hemorrhoids   COLONOSCOPY  08/2007   Dr. Malva Limes hyperplastic polyp   COLONOSCOPY N/A  12/14/2012   Procedure: COLONOSCOPY;  Surgeon: Daneil Dolin, MD;  [facility];  Service: Endoscopy;  Laterality: N/A;  8:30   CYSTOSCOPY/RETROGRADE/URETEROSCOPY Right 06/27/2017   Procedure: CYSTOSCOPY/RIGHT RETROGRADE PYELOGRAM/RIGHT URETEROSCOPY;  Surgeon: Cleon Gustin, MD;  Location: [facility];  Service: Urology;  Laterality: Right;   HOLMIUM LASER APPLICATION Right 5/36/6440   Procedure: HOLMIUM LASER LITHOTRIPSY RIGHT RENAL CALCULUS;  Surgeon: Cleon Gustin, MD;  Location: [facility];  Service: Urology;  Laterality: Right;   TONSILLECTOMY        Current Facility-Administered Medications:    acetaminophen (TYLENOL) tablet 650 mg, 650 mg, Oral, Q6H PRN **OR** acetaminophen (TYLENOL) suppository 650 mg, 650 mg, Rectal, Q6H PRN, Adefeso, Oladapo, DO   apixaban (ELIQUIS) tablet 5 mg, 5 mg, Oral, BID, Adefeso, Oladapo, DO, 5 mg at 10/14/21 1000   Chlorhexidine Gluconate Cloth 2 % PADS 6 each, 6 each, Topical, Q0600, Adefeso, Oladapo, DO, 6 each at 10/14/21 0516   [COMPLETED] diltiazem (CARDIZEM) 1 mg/mL load via infusion 15 mg, 15 mg, Intravenous, Once, 15 mg at 10/13/21 1637 **AND** diltiazem (CARDIZEM) 125 mg in dextrose 5% 125 mL (1 mg/mL) infusion, 5-15 mg/hr, Intravenous, Continuous, Adefeso, Oladapo, DO, Last Rate: 10 mL/hr at 10/14/21 0655, 10 mg/hr at 10/14/21 0655   insulin aspart (novoLOG) injection 0-5 Units, 0-5 Units, Subcutaneous, QHS, Adefeso, Oladapo, DO, 2 Units at 10/13/21 2342   insulin aspart (novoLOG) injection 0-9 Units, 0-9 Units, Subcutaneous, TID WC, Adefeso, Oladapo, DO, 5 Units at 10/14/21 1137   insulin glargine-yfgn (SEMGLEE) injection 10 Units, 10 Units, Subcutaneous, QHS, Adefeso, Oladapo, DO, 10 Units at 10/13/21 2342   ipratropium-albuterol (DUONEB) 0.5-2.5 (3) MG/3ML nebulizer solution 3 mL, 3 mL, Nebulization, Q4H PRN, Adefeso, Oladapo, DO, 3 mL at 10/14/21 0117   pantoprazole (PROTONIX) EC tablet 40 mg, 40 mg, Oral, Daily, Adefeso, Oladapo, DO,  40 mg at 10/14/21 1000  apixaban  5 mg Oral BID   Chlorhexidine Gluconate Cloth  6 each Topical Q0600   insulin aspart  0-5 Units Subcutaneous QHS   insulin aspart  0-9 Units Subcutaneous TID WC   insulin glargine-yfgn  10 Units Subcutaneous QHS   pantoprazole  40 mg Oral Daily    diltiazem (CARDIZEM) infusion 10 mg/hr (10/14/21 0655)    Physical Exam: Blood pressure (!) 156/44, pulse (!) 51, temperature (!) 97.4 F (36.3 C), temperature source Oral, resp. rate (!) 33, height '5\' 3"'$  (1.6 m), weight 92.8 kg, SpO2 99 %.   Confused elderly female Lungs clear No murmur Abdomen benign Trace edema Palpable pedal pulses   Labs:   Lab Results  Component Value Date   WBC 9.5 10/14/2021   HGB 13.1 10/14/2021  HCT 41.3 10/14/2021   MCV 93.9 10/14/2021   PLT 224 10/14/2021    Recent Labs  Lab 10/14/21 0401  NA 141  K 3.4*  CL 111  CO2 24  BUN 24*  CREATININE 0.77  CALCIUM 8.1*  PROT 6.4*  BILITOT 0.7  ALKPHOS 88  ALT 35  AST 20  GLUCOSE 173*   No results found for: CKTOTAL, CKMB, CKMBINDEX, TROPONINI  Lab Results  Component Value Date   CHOL 149 07/13/2021   CHOL 163 03/11/2020   CHOL 121 05/02/2019   Lab Results  Component Value Date   HDL 47 07/13/2021   HDL 53 03/11/2020   HDL 48 05/02/2019   Lab Results  Component Value Date   LDLCALC 70 07/13/2021   LDLCALC 91 03/11/2020   LDLCALC 56 05/02/2019   Lab Results  Component Value Date   TRIG 190 (H) 07/13/2021   TRIG 105 03/11/2020   TRIG 91 05/02/2019   Lab Results  Component Value Date   CHOLHDL 3.2 07/13/2021   CHOLHDL 3.1 03/11/2020   CHOLHDL 2.5 05/02/2019   No results found for: LDLDIRECT    Radiology: [facility] Chest Port 1 View  Result Date: 10/13/2021 CLINICAL DATA:  10009. Pt having palpitations that started today, seen at Dr. Lance Sell office and sent her for tachycardia. Hx of CHF EXAM: PORTABLE CHEST 1 VIEW COMPARISON:  Chest x-ray 02/03/2018, chest x-ray 09/24/2010 FINDINGS: Cardiac  paddles overlie the chest. Stable prominent cardiac silhouette likely exaggerated by portable AP technique. The heart and mediastinal contours are unchanged. Aortic calcification. Biapical pleural/pulmonary scarring. No focal consolidation. Chronic coarsened markings with no overt pulmonary edema. No pleural effusion. No pneumothorax. No acute osseous abnormality. Degenerative changes of the right shoulder. Redemonstration of a well corticated 1.9 cm calcific density along the left shoulder. IMPRESSION: 1. No active disease. 2.  Aortic Atherosclerosis (ICD10-I70.0). Electronically Signed   By: Iven Finn M.D.   On: 10/13/2021 16:36   ECHOCARDIOGRAM COMPLETE  Result Date: 10/14/2021    ECHOCARDIOGRAM REPORT   Patient Name:   Shelly Jackson Date of Exam: 10/14/2021 Medical Rec #:  [id number]      Height:       63.0 in Accession #:    [id number]     Weight:       204.6 lb Date of Birth:  1934/07/17     BSA:          1.952 m Patient Age:    26 years       BP:           156/44 mmHg Patient Gender: F              HR:           80 bpm. Exam Location:  [facility] Procedure: 2D Echo, Cardiac Doppler and Color Doppler Indications:    Atrial flutter  History:        Patient has no prior history of Echocardiogram examinations.                 Arrythmias:Atrial Fibrillation and Bradycardia; Risk                 Factors:Hypertension, Diabetes and Dyslipidemia.  Sonographer:    Wenda Low Referring Phys: [place]  1. Distal septal hypokinesis . Left ventricular ejection fraction, by estimation, is 50 to 55%. The left ventricle has low normal function. The left ventricle has no regional wall motion abnormalities. There is mild left  ventricular hypertrophy. Left ventricular diastolic parameters are indeterminate.  2. Right ventricular systolic function is normal. The right ventricular size is normal. There is normal pulmonary artery systolic pressure.  3. Left atrial size was moderately dilated.  4.  The mitral valve is abnormal. Trivial mitral valve regurgitation. No evidence of mitral stenosis.  5. The aortic valve is tricuspid. There is moderate calcification of the aortic valve. Aortic valve regurgitation is not visualized. Aortic valve sclerosis/calcification is present, without any evidence of aortic stenosis.  6. The inferior vena cava is normal in size with greater than 50% respiratory variability, suggesting right atrial pressure of 3 mmHg. FINDINGS  Left Ventricle: Distal septal hypokinesis. Left ventricular ejection fraction, by estimation, is 50 to 55%. The left ventricle has low normal function. The left ventricle has no regional wall motion abnormalities. The left ventricular internal cavity size was normal in size. There is mild left ventricular hypertrophy. Left ventricular diastolic parameters are indeterminate. Right Ventricle: The right ventricular size is normal. No increase in right ventricular wall thickness. Right ventricular systolic function is normal. There is normal pulmonary artery systolic pressure. The tricuspid regurgitant velocity is 1.69 m/s, and  with an assumed right atrial pressure of 3 mmHg, the estimated right ventricular systolic pressure is 42.6 mmHg. Left Atrium: Left atrial size was moderately dilated. Right Atrium: Right atrial size was normal in size. Pericardium: There is no evidence of pericardial effusion. Mitral Valve: The mitral valve is abnormal. There is moderate thickening of the mitral valve leaflet(s). There is moderate calcification of the mitral valve leaflet(s). Mild mitral annular calcification. Trivial mitral valve regurgitation. No evidence of  mitral valve stenosis. MV peak gradient, 10.6 mmHg. The mean mitral valve gradient is 3.0 mmHg. Tricuspid Valve: The tricuspid valve is normal in structure. Tricuspid valve regurgitation is mild . No evidence of tricuspid stenosis. Aortic Valve: The aortic valve is tricuspid. There is moderate calcification of  the aortic valve. Aortic valve regurgitation is not visualized. Aortic valve sclerosis/calcification is present, without any evidence of aortic stenosis. Aortic valve mean gradient measures 5.0 mmHg. Aortic valve peak gradient measures 9.4 mmHg. Aortic valve area, by VTI measures 2.01 cm. Pulmonic Valve: The pulmonic valve was normal in structure. Pulmonic valve regurgitation is not visualized. No evidence of pulmonic stenosis. Aorta: The aortic root is normal in size and structure. Venous: The inferior vena cava is normal in size with greater than 50% respiratory variability, suggesting right atrial pressure of 3 mmHg. IAS/Shunts: No atrial level shunt detected by color flow Doppler.  LEFT VENTRICLE PLAX 2D LVIDd:         4.30 cm     Diastology LVIDs:         3.10 cm     LV e' medial:    4.82 cm/s LV PW:         1.50 cm     LV E/e' medial:  29.7 LV IVS:        1.30 cm     LV e' lateral:   6.13 cm/s LVOT diam:     2.00 cm     LV E/e' lateral: 23.3 LV SV:         59 LV SV Index:   30 LVOT Area:     3.14 cm  LV Volumes (MOD) LV vol d, MOD A2C: 64.9 ml LV vol d, MOD A4C: 74.6 ml LV vol s, MOD A2C: 28.7 ml LV vol s, MOD A4C: 36.4 ml LV SV MOD A2C:  36.2 ml LV SV MOD A4C:     74.6 ml LV SV MOD BP:      37.9 ml RIGHT VENTRICLE RV Basal diam:  3.80 cm RV Mid diam:    3.10 cm RV S prime:     7.62 cm/s TAPSE (M-mode): 1.9 cm LEFT ATRIUM              Index        RIGHT ATRIUM           Index LA diam:        5.00 cm  2.56 cm/m   RA Area:     16.20 cm LA Vol (A2C):   103.0 ml 52.76 ml/m  RA Volume:   39.90 ml  20.44 ml/m LA Vol (A4C):   98.9 ml  50.66 ml/m LA Biplane Vol: 103.0 ml 52.76 ml/m  AORTIC VALVE                     PULMONIC VALVE AV Area (Vmax):    1.88 cm      PV Vmax:       0.75 m/s AV Area (Vmean):   1.66 cm      PV Peak grad:  2.2 mmHg AV Area (VTI):     2.01 cm AV Vmax:           153.00 cm/s AV Vmean:          104.000 cm/s AV VTI:            0.293 m AV Peak Grad:      9.4 mmHg AV Mean Grad:      5.0  mmHg LVOT Vmax:         91.60 cm/s LVOT Vmean:        55.100 cm/s LVOT VTI:          0.187 m LVOT/AV VTI ratio: 0.64  AORTA Ao Root diam: 3.10 cm MITRAL VALVE                TRICUSPID VALVE MV Area (PHT): 4.06 cm     TR Peak grad:   11.4 mmHg MV Area VTI:   1.57 cm     TR Vmax:        169.00 cm/s MV Peak grad:  10.6 mmHg MV Mean grad:  3.0 mmHg     SHUNTS MV Vmax:       1.63 m/s     Systemic VTI:  0.19 m MV Vmean:      76.8 cm/s    Systemic Diam: 2.00 cm MV Decel Time: 187 msec MV E velocity: 143.00 cm/s MV A velocity: 41.70 cm/s MV E/A ratio:  3.43 Jenkins Rouge MD Electronically signed by Jenkins Rouge MD Signature Date/Time: 10/14/2021/3:42:00 PM    Final     EKG: Flutter rate 145 no acute ST changes    ASSESSMENT AND PLAN:   Atrial Flutter/Fib:  on eliquis and iv cardizem for rate contol. Rate control currently more difficult due to agitation consider changing to oral meds in am TTE just read by me with moderate LAE normal EF valves ok  MS Changes :  nurse indicates she has been confused all day She thinks she's in [place] and keeps asking me who I am given afib consider CT/MRI to r/o stroke vs metabolic encephalopathy consider checking UA DM:  Discussed low carb diet.  Target hemoglobin A1c is 6.5 or less.  Continue current medications.  Thyroid:  TSH elevated 8.8 consider  synthroid replacement   Signed: Jenkins Rouge 10/14/2021, 3:48 PM

## 2021-10-14 NOTE — Assessment & Plan Note (Addendum)
-  Cardiology service consulted; appreciate recommendations and assistance with care. -Patient was transition off Cardizem drip and IV Lopressor; she developed  junctional bradycardia and was temporary off any controlling agents. Then re-experienced A. Fib. -Now discharge on lopressor BID and eliquis for secondary prevention. -Outpatient follow-up with cardiology service. -Continue the use of Synthroid for diagnosed hypothyroidism.   -2D echo with preserved ejection fraction.  No wall motion abnormalities.  Mild left ventricular hypertrophy and indeterminate diastolic ventricular parameters. -Patient without complaints of chest pain or palpitations.Marland Kitchen

## 2021-10-14 NOTE — Assessment & Plan Note (Addendum)
-  Blood pressure was intermittently fluctuating  -Continue daily Cozaar; heart healthy/low-sodium diet and the use of Lopressor twice a day. -Reassess blood pressure and further adjust antihypertensive regimen as required.

## 2021-10-14 NOTE — Plan of Care (Signed)
?  Problem: Elimination: ?Goal: Will not experience complications related to urinary retention ?Outcome: Progressing ?  ?

## 2021-10-14 NOTE — Assessment & Plan Note (Addendum)
-  In the setting of demand ischemia from atrial flutter/A-fib with RVR. -Patient denies chest pain. -Flat elevation appreciated. -will follow recommendations by cardiology service. -2D echo reassuring; no wall motion abnormalities appreciated and preserved ejection fraction seen.

## 2021-10-14 NOTE — Assessment & Plan Note (Addendum)
-  Low calorie diet and portion control discussed with patient -Body mass index is 36.24 kg/m.

## 2021-10-14 NOTE — Plan of Care (Signed)
  Problem: Acute Rehab PT Goals(only PT should resolve) Goal: Pt Will Go Supine/Side To Sit Outcome: Progressing Flowsheets (Taken 10/14/2021 1226) Pt will go Supine/Side to Sit:  with supervision  with min guard assist Goal: Patient Will Perform Sitting Balance Outcome: Progressing Flowsheets (Taken 10/14/2021 1226) Patient will perform sitting balance:  1-2 min  with bilateral UE support  with supervision  with min guard assist Goal: Patient Will Transfer Sit To/From Stand Outcome: Progressing Flowsheets (Taken 10/14/2021 1226) Patient will transfer sit to/from stand:  with min guard assist  with supervision Goal: Pt Will Transfer Bed To Chair/Chair To Bed Outcome: Progressing Flowsheets (Taken 10/14/2021 1226) Pt will Transfer Bed to Chair/Chair to Bed:  with min assist  min guard assist Goal: Pt Will Perform Standing Balance Or Pre-Gait Outcome: Progressing Flowsheets (Taken 10/14/2021 1226) Pt will perform standing balance or pre-gait:  with bilateral UE support  1-2 min  with Supervision  with min guard assist Goal: Pt Will Ambulate Outcome: Progressing Flowsheets (Taken 10/14/2021 1226) Pt will Ambulate:  50 feet  with minimal assist  with min guard assist  with rolling walker   12:27 PM, 10/14/21 Lestine Box, S/PT

## 2021-10-15 ENCOUNTER — Inpatient Hospital Stay (HOSPITAL_COMMUNITY): Payer: Medicare Other

## 2021-10-15 DIAGNOSIS — I4892 Unspecified atrial flutter: Secondary | ICD-10-CM | POA: Diagnosis not present

## 2021-10-15 DIAGNOSIS — N390 Urinary tract infection, site not specified: Secondary | ICD-10-CM | POA: Diagnosis present

## 2021-10-15 LAB — BASIC METABOLIC PANEL
Anion gap: 7 (ref 5–15)
BUN: 20 mg/dL (ref 8–23)
CO2: 26 mmol/L (ref 22–32)
Calcium: 8 mg/dL — ABNORMAL LOW (ref 8.9–10.3)
Chloride: 106 mmol/L (ref 98–111)
Creatinine, Ser: 0.94 mg/dL (ref 0.44–1.00)
GFR, Estimated: 60 mL/min — ABNORMAL LOW (ref >60)
Glucose, Bld: 156 mg/dL — ABNORMAL HIGH (ref 70–99)
Potassium: 3.5 mmol/L (ref 3.5–5.1)
Sodium: 139 mmol/L (ref 135–145)

## 2021-10-15 LAB — CBC
HCT: 39.5 % (ref 36.0–46.0)
Hemoglobin: 12.7 g/dL (ref 12.0–15.0)
MCH: 30.2 pg (ref 26.0–34.0)
MCHC: 32.2 g/dL (ref 30.0–36.0)
MCV: 94 fL (ref 80.0–100.0)
Platelets: 202 10*3/uL (ref 150–400)
RBC: 4.2 MIL/uL (ref 3.87–5.11)
RDW: 14.6 % (ref 11.5–15.5)
WBC: 11.7 10*3/uL — ABNORMAL HIGH (ref 4.0–10.5)
nRBC: 0 % (ref 0.0–0.2)

## 2021-10-15 LAB — GLUCOSE, CAPILLARY
Glucose-Capillary: 152 mg/dL — ABNORMAL HIGH (ref 70–99)
Glucose-Capillary: 167 mg/dL — ABNORMAL HIGH (ref 70–99)
Glucose-Capillary: 188 mg/dL — ABNORMAL HIGH (ref 70–99)
Glucose-Capillary: 214 mg/dL — ABNORMAL HIGH (ref 70–99)

## 2021-10-15 LAB — BLOOD GAS, ARTERIAL
Acid-Base Excess: 7 mmol/L — ABNORMAL HIGH (ref 0.0–2.0)
Bicarbonate: 31.3 mmol/L — ABNORMAL HIGH (ref 20.0–28.0)
Drawn by: 27733
FIO2: 21 %
O2 Saturation: 97.7 %
Patient temperature: 36.3
pCO2 arterial: 41 mmHg (ref 32–48)
pH, Arterial: 7.49 — ABNORMAL HIGH (ref 7.35–7.45)
pO2, Arterial: 71 mmHg — ABNORMAL LOW (ref 83–108)

## 2021-10-15 LAB — MAGNESIUM: Magnesium: 1.7 mg/dL (ref 1.7–2.4)

## 2021-10-15 MED ORDER — SODIUM CHLORIDE 0.9 % IV SOLN
1.0000 g | INTRAVENOUS | Status: DC
  Administered 2021-10-15 – 2021-10-18 (×4): 1 g via INTRAVENOUS
  Filled 2021-10-15 (×4): qty 10

## 2021-10-15 MED ORDER — DILTIAZEM HCL ER 90 MG PO CP12
90.0000 mg | ORAL_CAPSULE | Freq: Two times a day (BID) | ORAL | Status: DC
  Administered 2021-10-15: 90 mg via ORAL
  Filled 2021-10-15 (×8): qty 1

## 2021-10-15 MED ORDER — METOPROLOL TARTRATE 25 MG PO TABS
25.0000 mg | ORAL_TABLET | Freq: Two times a day (BID) | ORAL | Status: DC
Start: 2021-10-15 — End: 2021-10-16

## 2021-10-15 MED ORDER — POTASSIUM CHLORIDE CRYS ER 20 MEQ PO TBCR
40.0000 meq | EXTENDED_RELEASE_TABLET | Freq: Once | ORAL | Status: AC
  Administered 2021-10-15: 40 meq via ORAL
  Filled 2021-10-15: qty 2

## 2021-10-15 NOTE — Progress Notes (Addendum)
Physical Therapy Treatment Patient Details Name: Shelly Jackson MRN: [id number] DOB: [birth year] Today's Date: 10/15/2021   History of Present Illness Shelly Jackson is an 87 y.o. female with medical history significant of GERD, T2DM, hypertension, hyperlipidemia who presents to the emergency department from her PCPs office due to palpitations.  Patient states that she went for a routine checkup at her primary care's office and she was noted to have an increased heart rate (150s), so she was sent to the ED for further evaluation.  Patient denies chest pain, shortness of breath, headache, fever, chills, nausea, vomiting, abdominal pain, blurry vision or lightheadedness    PT Comments    Patient presents supine in bed on 2 LPM and consents to PT treatment. Treatment session performed on room air. Patient is min/mod assist with bed mobility demonstrating good rolling form but limited by UE weakness and decreased trunk control. Patient is min assist with transfers and use of RW. Good technical form  but balance and coordination deficits observed. Patient able to ambulate 10 feet in room with min assist and RW. Still demonstrates shuffling pattern with need of verbal/tactile cueing to improve gait deviations. Vitals monitored during session in which patient was [facility] and in therapeutic range with all vital signs. Patient tolerated therapeutic exercises but limited by fatigue.  Patient will benefit from continued skilled physical therapy in hospital and recommended venue below to increase strength, balance, endurance for safe ADLs and gait.    Recommendations for follow up therapy are one component of a multi-disciplinary discharge planning process, led by the attending physician.  Recommendations may be updated based on patient status, additional functional criteria and insurance authorization.  Follow Up Recommendations  Skilled nursing-short term rehab (<3 hours/day)     Assistance Recommended at  Discharge Set up Supervision/Assistance  Patient can return home with the following A little help with walking and/or transfers;Help with stairs or ramp for entrance;A little help with bathing/dressing/bathroom;Assistance with cooking/housework   Equipment Recommendations  None recommended by PT    Recommendations for Other Services       Precautions / Restrictions Precautions Precautions: Fall Restrictions Weight Bearing Restrictions: No     Mobility  Bed Mobility Overal bed mobility: Needs Assistance Bed Mobility: Supine to Sit     Supine to sit: Min assist, Mod assist     General bed mobility comments: Min/mod assist needed. Demonstrates good rolling form but struggles stitting up due to UE weakness and decreased trunk control    Transfers Overall transfer level: Needs assistance Equipment used: Rolling walker (2 wheels) Transfers: Sit to/from Stand, Bed to chair/wheelchair/BSC Sit to Stand: Min assist   Step pivot transfers: Min assist       General transfer comment: Min assist transfers with RW. Good and safe technical form but balance and coordination deficits observed    Ambulation/Gait Ambulation/Gait assistance: Min guard, Min assist Gait Distance (Feet): 10 Feet Assistive device: Rolling walker (2 wheels) Gait Pattern/deviations: Shuffle, Decreased step length - right, Decreased step length - left, Step-to pattern, Decreased stride length, Decreased weight shift to right, Decreased weight shift to left Gait velocity: decreased     General Gait Details: Able to ambulate 10 feet in room with min assist and RW. Shuffling gait pattern with verbal/tactile cueing needed. Vitals monitored   Stairs             Wheelchair Mobility    Modified Rankin (Stroke Patients Only)       Balance Overall balance assessment:  Needs assistance Sitting-balance support: Bilateral upper extremity supported, Feet supported Sitting balance-Leahy Scale:  Good Sitting balance - Comments: Patient able to sit EOB with UE support   Standing balance support: Bilateral upper extremity supported, During functional activity, Reliant on assistive device for balance Standing balance-Leahy Scale: Fair Standing balance comment: Balance deficits observed with patient demonstrating narrow stance and shuffling with reaching outside BOS.                            Cognition Arousal/Alertness: Awake/alert Behavior During Therapy: WFL for tasks assessed/performed Overall Cognitive Status: Within Functional Limits for tasks assessed                                          Exercises General Exercises - Lower Extremity Long Arc Quad: 10 reps, Both, Strengthening, AROM, Seated Hip Flexion/Marching: 5 reps, Both, Strengthening, AROM, Seated Toe Raises: AROM, Strengthening, Both, 15 reps, Seated Heel Raises: AROM, Strengthening, 15 reps, Seated, Both    General Comments        Pertinent Vitals/Pain Pain Assessment Pain Assessment: No/denies pain    Home Living                          Prior Function            PT Goals (current goals can now be found in the care plan section) Acute Rehab PT Goals Patient Stated Goal: return home PT Goal Formulation: With patient Time For Goal Achievement: 10/22/21 Potential to Achieve Goals: Good Progress towards PT goals: Progressing toward goals    Frequency    Min 3X/week      PT Plan Current plan remains appropriate    Co-evaluation              AM-PAC PT "6 Clicks" Mobility   Outcome Measure  Help needed turning from your back to your side while in a flat bed without using bedrails?: A Little Help needed moving from lying on your back to sitting on the side of a flat bed without using bedrails?: A Little Help needed moving to and from a bed to a chair (including a wheelchair)?: A Little Help needed standing up from a chair using your arms (e.g.,  wheelchair or bedside chair)?: A Lot Help needed to walk in hospital room?: A Lot Help needed climbing 3-5 steps with a railing? : A Lot 6 Click Score: 15    End of Session   Activity Tolerance: Patient tolerated treatment well;Patient limited by fatigue;No increased pain Patient left: in chair;with call bell/phone within reach Nurse Communication: Mobility status PT Visit Diagnosis: Unsteadiness on feet (R26.81);Other abnormalities of gait and mobility (R26.89);Muscle weakness (generalized) (M62.81)     Time: 1610-9604 PT Time Calculation (min) (ACUTE ONLY): 29 min  Charges:  $Therapeutic Exercise: 8-22 mins $Therapeutic Activity: 8-22 mins                     3:49 PM, 10/15/21 Lestine Box, S/PT

## 2021-10-15 NOTE — Progress Notes (Signed)
Progress Note  Patient Name: Shelly Jackson Date of Encounter: 10/15/2021  Primary Cardiologist: Dr. Jenkins Rouge  Subjective   Patient unresponsive this morning, has been confused yesterday.  Not taking p.o. as per discussion with nursing.  Inpatient Medications    Scheduled Meds:  apixaban  5 mg Oral BID   busPIRone  5 mg Oral BID   Chlorhexidine Gluconate Cloth  6 each Topical Q0600   citalopram  20 mg Oral QHS   diltiazem  90 mg Oral Q8H   insulin aspart  0-5 Units Subcutaneous QHS   insulin aspart  0-9 Units Subcutaneous TID WC   insulin glargine-yfgn  12 Units Subcutaneous QHS   levothyroxine  50 mcg Oral Q0600   metoprolol tartrate  5 mg Intravenous Q6H   pantoprazole  40 mg Oral Daily   potassium chloride  40 mEq Oral Once   potassium chloride  40 mEq Oral Once   Continuous Infusions:  diltiazem (CARDIZEM) infusion 5 mg/hr (10/15/21 0730)   PRN Meds: acetaminophen **OR** acetaminophen, ipratropium-albuterol, LORazepam   Vital Signs    Vitals:   10/15/21 0545 10/15/21 0600 10/15/21 0615 10/15/21 0630  BP:  (!) 149/66  139/62  Pulse: 67 67 67 67  Resp: 19 (!) '23 20 20  '$ Temp:      TempSrc:      SpO2: 100% 100% 100% 100%  Weight:      Height:        Intake/Output Summary (Last 24 hours) at 10/15/2021 0835 Last data filed at 10/15/2021 0729 Gross per 24 hour  Intake 390.29 ml  Output --  Net 390.29 ml   Filed Weights   10/13/21 2345 10/14/21 0405 10/15/21 0500  Weight: 92.3 kg 92.8 kg 91.7 kg    Telemetry    Atypical atrial flutter with controlled ventricular response.  Personally reviewed.  ECG    An ECG dated 10/13/2021 was personally reviewed today and demonstrated:  Atypical atrial flutter with 2:1 block.  Physical Exam   GEN: No acute distress.   Neck: No JVD. Cardiac: RRR, no murmur, rub, or gallop.  Respiratory: Nonlabored. Clear to auscultation bilaterally. GI: Soft, nontender, bowel sounds present. MS: No edema. Neuro: Unresponsive  this morning, does not follow commands.  Labs    Chemistry Recent Labs  Lab 10/13/21 1556 10/14/21 0401 10/15/21 0441  NA 139 141 139  K 4.1 3.4* 3.5  CL 110 111 106  CO2 '24 24 26  '$ GLUCOSE 306* 173* 156*  BUN 30* 24* 20  CREATININE 1.17* 0.77 0.94  CALCIUM 8.5* 8.1* 8.0*  PROT  --  6.4*  --   ALBUMIN  --  3.4*  --   AST  --  20  --   ALT  --  35  --   ALKPHOS  --  88  --   BILITOT  --  0.7  --   GFRNONAA 46* >60 60*  ANIONGAP '5 6 7     '$ Hematology Recent Labs  Lab 10/13/21 1556 10/14/21 0401 10/15/21 0441  WBC 11.5* 9.5 11.7*  RBC 4.66 4.40 4.20  HGB 13.7 13.1 12.7  HCT 43.0 41.3 39.5  MCV 92.3 93.9 94.0  MCH 29.4 29.8 30.2  MCHC 31.9 31.7 32.2  RDW 14.6 14.6 14.6  PLT 252 224 202    Cardiac Enzymes Recent Labs  Lab 10/13/21 1557 10/13/21 1743  TROPONINIHS 48* 41*    Radiology    CT HEAD WO CONTRAST (5MM)  Result Date: 10/14/2021 CLINICAL  DATA:  Mental status change with unknown cause. Progressive confusion today EXAM: CT HEAD WITHOUT CONTRAST TECHNIQUE: Contiguous axial images were obtained from the base of the skull through the vertex without intravenous contrast. RADIATION DOSE REDUCTION: This exam was performed according to the departmental dose-optimization program which includes automated exposure control, adjustment of the mA and/or kV according to patient size and/or use of iterative reconstruction technique. COMPARISON:  09/21/2019 FINDINGS: Brain: Significant motion artifact, pathology could be obscured. Unchanged generalized atrophy with ventriculomegaly. Unchanged fairly extensive low-density in the cerebral white matter attributed to chronic small vessel ischemia. No evidence of acute infarct, hemorrhage, hydrocephalus, or mass Vascular: No hyperdense vessel or unexpected calcification. Skull: Hyperostosis interna.  No acute or significant finding Sinuses/Orbits: Bilateral cataract resection.  No acute finding IMPRESSION: 1. Moderate motion  artifact. 2. Involutional changes without acute or interval finding. Electronically Signed   By: Jorje Guild M.D.   On: 10/14/2021 18:46   ECHOCARDIOGRAM COMPLETE  Result Date: 10/14/2021    ECHOCARDIOGRAM REPORT   Patient Name:   Shelly Jackson Date of Exam: 10/14/2021 Medical Rec #:  [id number]      Height:       63.0 in Accession #:    [id number]     Weight:       204.6 lb Date of Birth:  September 02, 1934     BSA:          1.952 m Patient Age:    87 years       BP:           156/44 mmHg Patient Gender: F              HR:           80 bpm. Exam Location:  [facility] Procedure: 2D Echo, Cardiac Doppler and Color Doppler Indications:    Atrial flutter  History:        Patient has no prior history of Echocardiogram examinations.                 Arrythmias:Atrial Fibrillation and Bradycardia; Risk                 Factors:Hypertension, Diabetes and Dyslipidemia.  Sonographer:    Wenda Low Referring Phys: [place]  1. Distal septal hypokinesis . Left ventricular ejection fraction, by estimation, is 50 to 55%. The left ventricle has low normal function. The left ventricle has no regional wall motion abnormalities. There is mild left ventricular hypertrophy. Left ventricular diastolic parameters are indeterminate.  2. Right ventricular systolic function is normal. The right ventricular size is normal. There is normal pulmonary artery systolic pressure.  3. Left atrial size was moderately dilated.  4. The mitral valve is abnormal. Trivial mitral valve regurgitation. No evidence of mitral stenosis.  5. The aortic valve is tricuspid. There is moderate calcification of the aortic valve. Aortic valve regurgitation is not visualized. Aortic valve sclerosis/calcification is present, without any evidence of aortic stenosis.  6. The inferior vena cava is normal in size with greater than 50% respiratory variability, suggesting right atrial pressure of 3 mmHg. FINDINGS  Left Ventricle: Distal septal  hypokinesis. Left ventricular ejection fraction, by estimation, is 50 to 55%. The left ventricle has low normal function. The left ventricle has no regional wall motion abnormalities. The left ventricular internal cavity size was normal in size. There is mild left ventricular hypertrophy. Left ventricular diastolic parameters are indeterminate. Right Ventricle: The right ventricular size is normal. No  increase in right ventricular wall thickness. Right ventricular systolic function is normal. There is normal pulmonary artery systolic pressure. The tricuspid regurgitant velocity is 1.69 m/s, and  with an assumed right atrial pressure of 3 mmHg, the estimated right ventricular systolic pressure is 19.5 mmHg. Left Atrium: Left atrial size was moderately dilated. Right Atrium: Right atrial size was normal in size. Pericardium: There is no evidence of pericardial effusion. Mitral Valve: The mitral valve is abnormal. There is moderate thickening of the mitral valve leaflet(s). There is moderate calcification of the mitral valve leaflet(s). Mild mitral annular calcification. Trivial mitral valve regurgitation. No evidence of  mitral valve stenosis. MV peak gradient, 10.6 mmHg. The mean mitral valve gradient is 3.0 mmHg. Tricuspid Valve: The tricuspid valve is normal in structure. Tricuspid valve regurgitation is mild . No evidence of tricuspid stenosis. Aortic Valve: The aortic valve is tricuspid. There is moderate calcification of the aortic valve. Aortic valve regurgitation is not visualized. Aortic valve sclerosis/calcification is present, without any evidence of aortic stenosis. Aortic valve mean gradient measures 5.0 mmHg. Aortic valve peak gradient measures 9.4 mmHg. Aortic valve area, by VTI measures 2.01 cm. Pulmonic Valve: The pulmonic valve was normal in structure. Pulmonic valve regurgitation is not visualized. No evidence of pulmonic stenosis. Aorta: The aortic root is normal in size and structure. Venous: The  inferior vena cava is normal in size with greater than 50% respiratory variability, suggesting right atrial pressure of 3 mmHg. IAS/Shunts: No atrial level shunt detected by color flow Doppler.  LEFT VENTRICLE PLAX 2D LVIDd:         4.30 cm     Diastology LVIDs:         3.10 cm     LV e' medial:    4.82 cm/s LV PW:         1.50 cm     LV E/e' medial:  29.7 LV IVS:        1.30 cm     LV e' lateral:   6.13 cm/s LVOT diam:     2.00 cm     LV E/e' lateral: 23.3 LV SV:         59 LV SV Index:   30 LVOT Area:     3.14 cm  LV Volumes (MOD) LV vol d, MOD A2C: 64.9 ml LV vol d, MOD A4C: 74.6 ml LV vol s, MOD A2C: 28.7 ml LV vol s, MOD A4C: 36.4 ml LV SV MOD A2C:     36.2 ml LV SV MOD A4C:     74.6 ml LV SV MOD BP:      37.9 ml RIGHT VENTRICLE RV Basal diam:  3.80 cm RV Mid diam:    3.10 cm RV S prime:     7.62 cm/s TAPSE (M-mode): 1.9 cm LEFT ATRIUM              Index        RIGHT ATRIUM           Index LA diam:        5.00 cm  2.56 cm/m   RA Area:     16.20 cm LA Vol (A2C):   103.0 ml 52.76 ml/m  RA Volume:   39.90 ml  20.44 ml/m LA Vol (A4C):   98.9 ml  50.66 ml/m LA Biplane Vol: 103.0 ml 52.76 ml/m  AORTIC VALVE  PULMONIC VALVE AV Area (Vmax):    1.88 cm      PV Vmax:       0.75 m/s AV Area (Vmean):   1.66 cm      PV Peak grad:  2.2 mmHg AV Area (VTI):     2.01 cm AV Vmax:           153.00 cm/s AV Vmean:          104.000 cm/s AV VTI:            0.293 m AV Peak Grad:      9.4 mmHg AV Mean Grad:      5.0 mmHg LVOT Vmax:         91.60 cm/s LVOT Vmean:        55.100 cm/s LVOT VTI:          0.187 m LVOT/AV VTI ratio: 0.64  AORTA Ao Root diam: 3.10 cm MITRAL VALVE                TRICUSPID VALVE MV Area (PHT): 4.06 cm     TR Peak grad:   11.4 mmHg MV Area VTI:   1.57 cm     TR Vmax:        169.00 cm/s MV Peak grad:  10.6 mmHg MV Mean grad:  3.0 mmHg     SHUNTS MV Vmax:       1.63 m/s     Systemic VTI:  0.19 m MV Vmean:      76.8 cm/s    Systemic Diam: 2.00 cm MV Decel Time: 187 msec MV E velocity:  143.00 cm/s MV A velocity: 41.70 cm/s MV E/A ratio:  3.43 Jenkins Rouge MD Electronically signed by Jenkins Rouge MD Signature Date/Time: 10/14/2021/3:42:00 PM    Final    DG Chest Port 1 View  Result Date: 10/13/2021 CLINICAL DATA:  10009. Pt having palpitations that started today, seen at Dr. Lance Sell office and sent her for tachycardia. Hx of CHF EXAM: PORTABLE CHEST 1 VIEW COMPARISON:  Chest x-ray 02/03/2018, chest x-ray 09/24/2010 FINDINGS: Cardiac paddles overlie the chest. Stable prominent cardiac silhouette likely exaggerated by portable AP technique. The heart and mediastinal contours are unchanged. Aortic calcification. Biapical pleural/pulmonary scarring. No focal consolidation. Chronic coarsened markings with no overt pulmonary edema. No pleural effusion. No pneumothorax. No acute osseous abnormality. Degenerative changes of the right shoulder. Redemonstration of a well corticated 1.9 cm calcific density along the left shoulder. IMPRESSION: 1. No active disease. 2.  Aortic Atherosclerosis (ICD10-I70.0). Electronically Signed   By: Iven Finn M.D.   On: 10/13/2021 16:36    Assessment & Plan    1.  Atypical atrial flutter presenting with RVR, heart rate better controlled now on IV Cardizem as well as divided dose IV Lopressor.  CHA2DS2-VASc score is 6.  Started on Eliquis for stroke prophylaxis.  LVEF 50 to 55% with distal septal hypokinesis, normal RV contraction, moderate left atrial enlargement and normal right atrial enlargement.  Estimated RVSP normal.  2.  Acute metabolic encephalopathy, etiology not entirely clear but possibly associated UTI based on urinalysis.  No obvious acute event by head CT.  3.  Essential hypertension, recently hypertensive with spikes in systolic blood pressure.  4.  Minor, flat elevation in high-sensitivity troponin I in the 40s most reflective of demand ischemia rather than ACS.  5.  Uncontrolled type 2 diabetes mellitus with hyperglycemia.  Hemoglobin A1c  8.3%.  From a cardiac perspective would continue IV Cardizem as  well as IV Lopressor given reasonable control of atypical atrial flutter.  If unable to take oral medications, could transition from Eliquis to Lovenox or heparin in the short-term.  Brain MRI would still be useful to exclude acute stroke given encephalopathy, no bleed noted by head CT.  Continue work-up per primary team.  Prognosis is guarded.  Signed, Rozann Lesches, MD  10/15/2021, 8:35 AM

## 2021-10-15 NOTE — Assessment & Plan Note (Signed)
-   Patient has been empirically started on Rocephin -Culture has been sent out prior to antibiotics initiation and will follow sensitivity and results. -Maintain adequate hydration -After discussing with patient's son he expressed similar presentation (encephalopathy) in the past while having acute UTI.

## 2021-10-15 NOTE — Progress Notes (Signed)
Progress Note   Patient: Shelly Jackson WSF:[id number] DOB: [birth year] DOA: 10/13/2021     1 DOS: the patient was seen and examined on 10/15/2021   Brief hospital course: As per H&P written by Dr. Josephine Cables on 10/13/2021 Shelly Jackson is an 87 y.o. female with medical history significant of GERD, T2DM, hypertension, hyperlipidemia who presents to the emergency department from her PCPs office due to palpitations.  Patient states that she went for a routine checkup at her primary care's office and she was noted to have an increased heart rate (150s), so she was sent to the ED for further evaluation.  Patient denies chest pain, shortness of breath, headache, fever, chills, nausea, vomiting, abdominal pain, blurry vision or lightheadedness.   ED Course:  In the emergency department, she was intermittently tachypneic and was initially tachycardic.  BP was 162/114, temperature 96.28F and O2 sat was 95% on room air.  Work-up in the ED showed normal CBC except for leukocytosis, and normal BMP except for hyperglycemia and BUN/creatinine 30/1.17 (baseline creatinine 0.7 x 0.9).  Troponin x1 - 48. Chest x-ray showed no active disease Patient was noted to be in atrial flutter and IV adenosine was initially given.  She was subsequently given IV loading dose of Cardizem and subsequently placed on IV Cardizem drip.  Hospitalist was asked to admit patient for further evaluation and management.  Assessment and Plan: * Atrial flutter with rapid ventricular response [facility]) -Cardiology service consulted; appreciate recommendations and assistance with care. -Will attempt to transition off Cardizem drip and use Lopressor -TSH elevated in the 8 range suggesting component of hypothyroidism. -2D echo with preserved ejection fraction.  No wall motion abnormalities.  Mild left ventricular hypertrophy and indeterminate diastolic ventricular parameters. -Patient without complaints of chest pain. -While receiving Cardizem and  Lopressor rate is controlled and is stable.   -CHADS score 5, started on Eliquis for secondary prevention.  Obesity - Low calorie diet and portion control discussed with patient -Body mass index is 36.24 kg/m.   UTI (urinary tract infection) - Patient has been empirically started on Rocephin -Culture has been sent out prior to antibiotics initiation and will follow sensitivity and results. -Maintain adequate hydration -After discussing with patient's son he expressed similar presentation (encephalopathy) in the past while having acute UTI.  Acute metabolic encephalopathy - Given presentation with atrial flutter/A-fib CT scan without contrast has rule out acute bleeding abnormalities. -MRI also has demonstrated no acute stroke. -Urinalysis with concern for UTI; empirically treating with Rocephin -Continue to minimize psychotropic agents and sedative medication. -Follow clinical response.  GERD (gastroesophageal reflux disease) -Continue PPI.  Elevated troponin -In the setting of demand ischemia from atrial flutter/A-fib with RVR. -Patient denies chest pain. -Flat elevation appreciated. -We will follow recommendations by cardiology service -2D echo reassuring; no wall motion and normalities appreciated on preserved ejection fraction seen.  Prolonged QT interval -Stabilize electrolytes (goal is for potassium above 4 and magnesium above 2) -Minimize medications that can further prolong QT -Continue telemetry monitoring.  Leukocytosis -Appears to be in the setting of demargination -Patient urinalysis with concern for UTI. -Empirically started on Rocephin -Follow WBCs trend. -Continue to maintain adequate hydration and follow clinical response.   Type 2 diabetes mellitus with hyperglycemia (HCC) - A1c 8.3 -Continue sliding scale insulin and Semglee while inpatient -Follow CBGs fluctuation with further adjustment to hypoglycemia regimen as required.  AKI (acute kidney  injury) ([place]) -In the setting of prerenal azotemia most likely -Improved/resolved after fluid resuscitation -Current creatinine  0.7  -continue to follow renal function trend.  Hypokalemia -Follow electrolytes and further replete as needed -Magnesium level within normal limits.  Essential hypertension - Blood pressure intermittently fluctuating; while receiving Cardizem and Lopressor appears to be stable. -Will also continue as needed hydralazine. -Heart healthy/low-sodium diet emphasized. -Continue to follow vital signs.   Subjective:  No fever, no nausea, no vomiting.  Sleepy/somnolent and confused.  Overnight with episodes of agitation.  Patient received lorazepam.  Physical Exam: Vitals:   10/15/21 1540 10/15/21 1542 10/15/21 1600 10/15/21 1639  BP: 130/70   (!) 118/47  Pulse:  (!) 107 96 (!) 52  Resp: (!) 23 19 (!) 27 (!) 28  Temp:   97.6 F (36.4 C)   TempSrc:   Oral   SpO2:  (!) 85% 100% 96%  Weight:      Height:       General exam: Sleepy/somnolent and unable to follow commands.  Keeping hard time keeping herself awake.  Overnight agitated requiring the use of Ativan with no use of her CPAP. Respiratory system: No crackles appreciated on exam; no using accessory muscles.  Good saturation on room air. Cardiovascular system: Rate controlled, no rubs, no gallops, no JVD. Gastrointestinal system: Abdomen is nondistended, soft and nontender. No organomegaly or masses felt. Normal bowel sounds heard. Central nervous system: No focal neurologic deficit appreciated; confused/encephalopathic at time of examination.  Neurologic examination limited. Extremities: No cyanosis or clubbing.  Trace edema appreciated bilaterally. Skin: No petechiae. Psychiatry: Judgement and insight appear impaired in the setting of acute encephalopathic process.  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 -CBC: With WBCs of 11.7, hemoglobin 12.7 platelet count 202K -BMET: Sodium 139, potassium 3.5, chloride 106, CO2 26, glucose 150s to 170s range; BUN 20, creatinine 0.94 and magnesium level 1.7 -ABG: pH 7.49, CO2 41, PO2 71 and bicarb 31.3.  Family Communication: Son and pastor at bedside.  Disposition: Status is: Inpatient Remains inpatient appropriate because: Ongoing acute metabolic encephalopathy with atrial flutter with RVR.   Planned Discharge Destination: Home with Home Health   Author: Barton Dubois, MD 10/15/2021 4:49 PM  For on call review [email or web address].

## 2021-10-15 NOTE — TOC Progression Note (Signed)
Transition of Care [facility]) - Progression Note    Patient Details  Name: Shelly Jackson MRN: [id number] Date of Birth: [birth year]  Transition of Care [facility]) CM/SW Contact  Salome Arnt, [place] Phone Number: 10/15/2021, 11:21 AM  Clinical Narrative:  Per Shelly Jackson with [facility], referral has been accepted by [facility]. Pt's son, Shelly Jackson notified. MD aware to place orders for HHPT, OT, SW. TOC will continue to follow.      Expected Discharge Plan: [place] Barriers to Discharge: Continued Medical Work up  Expected Discharge Plan and Services Expected Discharge Plan: Shelly Jackson In-house Referral: Clinical Social Work   Post Acute Care Choice: [place] arrangements for the past 2 months: Single Family Home                                       Social Determinants of Health (SDOH) Interventions    Readmission Risk Interventions     No data to display

## 2021-10-15 NOTE — Progress Notes (Addendum)
Dr Dyann Kief aware/seen EKG result.

## 2021-10-15 NOTE — Progress Notes (Signed)
HR dropping to 47 while on '10mg'$ /hr Cardizem drip, patient asymptomatic. Cardizem drip held. Uncertain rhythm, possibly junctional, Dr Dyann Kief notified, ordered stat EKG.

## 2021-10-16 DIAGNOSIS — E039 Hypothyroidism, unspecified: Secondary | ICD-10-CM | POA: Diagnosis present

## 2021-10-16 LAB — GLUCOSE, CAPILLARY
Glucose-Capillary: 166 mg/dL — ABNORMAL HIGH (ref 70–99)
Glucose-Capillary: 216 mg/dL — ABNORMAL HIGH (ref 70–99)
Glucose-Capillary: 140 mg/dL — ABNORMAL HIGH (ref 70–99)
Glucose-Capillary: 112 mg/dL — ABNORMAL HIGH (ref 70–99)

## 2021-10-16 MED ORDER — METOPROLOL TARTRATE 25 MG PO TABS
12.5000 mg | ORAL_TABLET | Freq: Two times a day (BID) | ORAL | Status: DC
Start: 2021-10-16 — End: 2021-10-16

## 2021-10-16 MED ORDER — METOPROLOL TARTRATE 25 MG PO TABS
12.5000 mg | ORAL_TABLET | Freq: Two times a day (BID) | ORAL | Status: DC
  Filled 2021-10-16: qty 1

## 2021-10-16 MED ORDER — ALUM & MAG HYDROXIDE-SIMETH 200-200-20 MG/5ML PO SUSP
30.0000 mL | ORAL | Status: DC | PRN
  Administered 2021-10-16: 30 mL via ORAL
  Filled 2021-10-16: qty 30

## 2021-10-16 NOTE — TOC Progression Note (Signed)
Transition of Care [facility]) - Progression Note    Patient Details  Name: Shelly Jackson MRN: [id number] Date of Birth: Aug 19, 1934  Transition of [facility]) CM/SW Contact  Ihor Gully, LCSW Phone Number: 10/16/2021, 2:20 PM  Clinical Narrative:    Sons now want patient to go to SNF in [place]. Referrals sent.    Expected Discharge Plan: [place] Barriers to Discharge: Continued Medical Work up  Expected Discharge Plan and Services Expected Discharge Plan: [place] In-house Referral: Clinical Social Work   Post Acute Care Choice: [place] arrangements for the past 2 months: Single Family Home                                       Social Determinants of Health (SDOH) Interventions    Readmission Risk Interventions     No data to display

## 2021-10-16 NOTE — Assessment & Plan Note (Signed)
-  Continue Synthroid -Repeat thyroid panel in 3 months with further adjustment to Synthroid dose as needed.

## 2021-10-16 NOTE — Progress Notes (Signed)
Progress Note  Patient Name: Shelly Jackson Date of Encounter: 10/16/2021  Primary Cardiologist: Dr. Jenkins Rouge  Subjective   Somnolent but arousable, on nasal CPAP this morning.  Inpatient Medications    Scheduled Meds:  apixaban  5 mg Oral BID   busPIRone  5 mg Oral BID   Chlorhexidine Gluconate Cloth  6 each Topical Q0600   citalopram  20 mg Oral QHS   diltiazem  90 mg Oral Q12H   insulin aspart  0-5 Units Subcutaneous QHS   insulin aspart  0-9 Units Subcutaneous TID WC   insulin glargine-yfgn  12 Units Subcutaneous QHS   levothyroxine  50 mcg Oral Q0600   metoprolol tartrate  25 mg Oral BID   pantoprazole  40 mg Oral Daily   potassium chloride  40 mEq Oral Once   Continuous Infusions:  cefTRIAXone (ROCEPHIN)  IV Stopped (10/15/21 1147)   PRN Meds: acetaminophen **OR** acetaminophen, ipratropium-albuterol   Vital Signs    Vitals:   10/16/21 0500 10/16/21 0610 10/16/21 0620 10/16/21 0700  BP:      Pulse:  (!) 55 (!) 51   Resp:  18 19   Temp:    (!) 96.7 F (35.9 C)  TempSrc:    Axillary  SpO2:  98% 100%   Weight: 91.7 kg     Height:        Intake/Output Summary (Last 24 hours) at 10/16/2021 0835 Last data filed at 10/16/2021 0341 Gross per 24 hour  Intake 605.84 ml  Output 462 ml  Net 143.84 ml   Filed Weights   10/14/21 0405 10/15/21 0500 10/16/21 0500  Weight: 92.8 kg 91.7 kg 91.7 kg    Telemetry    Sinus bradycardia.  Personally reviewed.  ECG    An ECG dated 10/15/2021 was personally reviewed today and demonstrated:  Junctional bradycardia.  Physical Exam   GEN: No acute distress.  On nasal CPAP. Neck: No JVD. Cardiac: RRR, no murmur, rub, or gallop.  Respiratory: Nonlabored. Clear to auscultation bilaterally. GI: Soft, nontender, bowel sounds present. MS: No edema. Neuro: Somnolent, moves all extremities.  Labs    Chemistry Recent Labs  Lab 10/13/21 1556 10/14/21 0401 10/15/21 0441  NA 139 141 139  K 4.1 3.4* 3.5  CL 110 111  106  CO2 '24 24 26  '$ GLUCOSE 306* 173* 156*  BUN 30* 24* 20  CREATININE 1.17* 0.77 0.94  CALCIUM 8.5* 8.1* 8.0*  PROT  --  6.4*  --   ALBUMIN  --  3.4*  --   AST  --  20  --   ALT  --  35  --   ALKPHOS  --  88  --   BILITOT  --  0.7  --   GFRNONAA 46* >60 60*  ANIONGAP '5 6 7     '$ Hematology Recent Labs  Lab 10/13/21 1556 10/14/21 0401 10/15/21 0441  WBC 11.5* 9.5 11.7*  RBC 4.66 4.40 4.20  HGB 13.7 13.1 12.7  HCT 43.0 41.3 39.5  MCV 92.3 93.9 94.0  MCH 29.4 29.8 30.2  MCHC 31.9 31.7 32.2  RDW 14.6 14.6 14.6  PLT 252 224 202    Cardiac Enzymes Recent Labs  Lab 10/13/21 1557 10/13/21 1743  TROPONINIHS 48* 41*    Radiology    MR BRAIN WO CONTRAST  Result Date: 10/15/2021 CLINICAL DATA:  Neuro deficit, acute, stroke suspected. EXAM: MRI HEAD WITHOUT CONTRAST TECHNIQUE: Multiplanar, multiecho pulse sequences of the brain and surrounding structures were  obtained without intravenous contrast. COMPARISON:  Head CT October 14, 2021. FINDINGS: Brain: No acute infarction, hemorrhage, hydrocephalus, extra-axial collection or mass lesion. 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 Moderate parenchymal volume loss. Scattered confluent foci of T2 hyperintensity within the white matter of the cerebral hemispheres and within pons, nonspecific most likely related to vessel ischemia. Vascular: Normal flow voids. Skull and upper cervical spine: Diffuse decrease of the T1 signal within the visualized bony structures may be related to anemia. However, marrow replacement pathologies cannot be entirely excluded. Sinuses/Orbits: Negative. Other: None. IMPRESSION: 1. No acute intracranial abnormality. 2. Advanced chronic microvascular ischemic changes the white matter. Small remote infarcts in the genu of the corpus callosum and left side of the pons. 3. Diffuse decrease of the T1 signal within the visualized bony structures may be related to anemia. However, marrow  replacement pathologies cannot be entirely excluded. Electronically Signed   By: Pedro Earls M.D.   On: 10/15/2021 13:06   CT HEAD WO CONTRAST (5MM)  Result Date: 10/14/2021 CLINICAL DATA:  Mental status change with unknown cause. Progressive confusion today EXAM: CT HEAD WITHOUT CONTRAST TECHNIQUE: Contiguous axial images were obtained from the base of the skull through the vertex without intravenous contrast. RADIATION DOSE REDUCTION: This exam was performed according to the departmental dose-optimization program which includes automated exposure control, adjustment of the mA and/or kV according to patient size and/or use of iterative reconstruction technique. COMPARISON:  09/21/2019 FINDINGS: Brain: Significant motion artifact, pathology could be obscured. Unchanged generalized atrophy with ventriculomegaly. Unchanged fairly extensive low-density in the cerebral white matter attributed to chronic small vessel ischemia. No evidence of acute infarct, hemorrhage, hydrocephalus, or mass Vascular: No hyperdense vessel or unexpected calcification. Skull: Hyperostosis interna.  No acute or significant finding Sinuses/Orbits: Bilateral cataract resection.  No acute finding IMPRESSION: 1. Moderate motion artifact. 2. Involutional changes without acute or interval finding. Electronically Signed   By: Jorje Guild M.D.   On: 10/14/2021 18:46   ECHOCARDIOGRAM COMPLETE  Result Date: 10/14/2021    ECHOCARDIOGRAM REPORT   Patient Name:   Shelly Jackson Date of Exam: 10/14/2021 Medical Rec #:  [id number]      Height:       63.0 in Accession #:    [id number]     Weight:       204.6 lb Date of Birth:  12-Aug-1934     BSA:          1.952 m Patient Age:    87 years       BP:           156/44 mmHg Patient Gender: F              HR:           80 bpm. Exam Location:  [facility] Procedure: 2D Echo, Cardiac Doppler and Color Doppler Indications:    Atrial flutter  History:        Patient has no prior history of  Echocardiogram examinations.                 Arrythmias:Atrial Fibrillation and Bradycardia; Risk                 Factors:Hypertension, Diabetes and Dyslipidemia.  Sonographer:    Wenda Low Referring Phys: [place]  1. Distal septal hypokinesis . Left ventricular ejection fraction, by estimation, is 50 to 55%. The left ventricle has low  normal function. The left ventricle has no regional wall motion abnormalities. There is mild left ventricular hypertrophy. Left ventricular diastolic parameters are indeterminate.  2. Right ventricular systolic function is normal. The right ventricular size is normal. There is normal pulmonary artery systolic pressure.  3. Left atrial size was moderately dilated.  4. The mitral valve is abnormal. Trivial mitral valve regurgitation. No evidence of mitral stenosis.  5. The aortic valve is tricuspid. There is moderate calcification of the aortic valve. Aortic valve regurgitation is not visualized. Aortic valve sclerosis/calcification is present, without any evidence of aortic stenosis.  6. The inferior vena cava is normal in size with greater than 50% respiratory variability, suggesting right atrial pressure of 3 mmHg. FINDINGS  Left Ventricle: Distal septal hypokinesis. Left ventricular ejection fraction, by estimation, is 50 to 55%. The left ventricle has low normal function. The left ventricle has no regional wall motion abnormalities. The left ventricular internal cavity size was normal in size. There is mild left ventricular hypertrophy. Left ventricular diastolic parameters are indeterminate. Right Ventricle: The right ventricular size is normal. No increase in right ventricular wall thickness. Right ventricular systolic function is normal. There is normal pulmonary artery systolic pressure. The tricuspid regurgitant velocity is 1.69 m/s, and  with an assumed right atrial pressure of 3 mmHg, the estimated right ventricular systolic pressure is 78.2  mmHg. Left Atrium: Left atrial size was moderately dilated. Right Atrium: Right atrial size was normal in size. Pericardium: There is no evidence of pericardial effusion. Mitral Valve: The mitral valve is abnormal. There is moderate thickening of the mitral valve leaflet(s). There is moderate calcification of the mitral valve leaflet(s). Mild mitral annular calcification. Trivial mitral valve regurgitation. No evidence of  mitral valve stenosis. MV peak gradient, 10.6 mmHg. The mean mitral valve gradient is 3.0 mmHg. Tricuspid Valve: The tricuspid valve is normal in structure. Tricuspid valve regurgitation is mild . No evidence of tricuspid stenosis. Aortic Valve: The aortic valve is tricuspid. There is moderate calcification of the aortic valve. Aortic valve regurgitation is not visualized. Aortic valve sclerosis/calcification is present, without any evidence of aortic stenosis. Aortic valve mean gradient measures 5.0 mmHg. Aortic valve peak gradient measures 9.4 mmHg. Aortic valve area, by VTI measures 2.01 cm. Pulmonic Valve: The pulmonic valve was normal in structure. Pulmonic valve regurgitation is not visualized. No evidence of pulmonic stenosis. Aorta: The aortic root is normal in size and structure. Venous: The inferior vena cava is normal in size with greater than 50% respiratory variability, suggesting right atrial pressure of 3 mmHg. IAS/Shunts: No atrial level shunt detected by color flow Doppler.  LEFT VENTRICLE PLAX 2D LVIDd:         4.30 cm     Diastology LVIDs:         3.10 cm     LV e' medial:    4.82 cm/s LV PW:         1.50 cm     LV E/e' medial:  29.7 LV IVS:        1.30 cm     LV e' lateral:   6.13 cm/s LVOT diam:     2.00 cm     LV E/e' lateral: 23.3 LV SV:         59 LV SV Index:   30 LVOT Area:     3.14 cm  LV Volumes (MOD) LV vol d, MOD A2C: 64.9 ml LV vol d, MOD A4C: 74.6 ml LV vol s, MOD A2C: 28.7  ml LV vol s, MOD A4C: 36.4 ml LV SV MOD A2C:     36.2 ml LV SV MOD A4C:     74.6 ml LV SV  MOD BP:      37.9 ml RIGHT VENTRICLE RV Basal diam:  3.80 cm RV Mid diam:    3.10 cm RV S prime:     7.62 cm/s TAPSE (M-mode): 1.9 cm LEFT ATRIUM              Index        RIGHT ATRIUM           Index LA diam:        5.00 cm  2.56 cm/m   RA Area:     16.20 cm LA Vol (A2C):   103.0 ml 52.76 ml/m  RA Volume:   39.90 ml  20.44 ml/m LA Vol (A4C):   98.9 ml  50.66 ml/m LA Biplane Vol: 103.0 ml 52.76 ml/m  AORTIC VALVE                     PULMONIC VALVE AV Area (Vmax):    1.88 cm      PV Vmax:       0.75 m/s AV Area (Vmean):   1.66 cm      PV Peak grad:  2.2 mmHg AV Area (VTI):     2.01 cm AV Vmax:           153.00 cm/s AV Vmean:          104.000 cm/s AV VTI:            0.293 m AV Peak Grad:      9.4 mmHg AV Mean Grad:      5.0 mmHg LVOT Vmax:         91.60 cm/s LVOT Vmean:        55.100 cm/s LVOT VTI:          0.187 m LVOT/AV VTI ratio: 0.64  AORTA Ao Root diam: 3.10 cm MITRAL VALVE                TRICUSPID VALVE MV Area (PHT): 4.06 cm     TR Peak grad:   11.4 mmHg MV Area VTI:   1.57 cm     TR Vmax:        169.00 cm/s MV Peak grad:  10.6 mmHg MV Mean grad:  3.0 mmHg     SHUNTS MV Vmax:       1.63 m/s     Systemic VTI:  0.19 m MV Vmean:      76.8 cm/s    Systemic Diam: 2.00 cm MV Decel Time: 187 msec MV E velocity: 143.00 cm/s MV A velocity: 41.70 cm/s MV E/A ratio:  3.43 Jenkins Rouge MD Electronically signed by Jenkins Rouge MD Signature Date/Time: 10/14/2021/3:42:00 PM    Final     Assessment & Plan    1.  Atypical atrial flutter presenting with RVR.  CHA2DS2-VASc score is 6.  She has spontaneously converted, initially to junctional bradycardia and now sinus bradycardia.  I reviewed interval ECG.  She continues on Eliquis, IV Lopressor and Cardizem have been discontinued.  LVEF 50 to 55% with distal septal hypokinesis, normal RV contraction, moderate left atrial enlargement and normal right atrial enlargement.  Estimated RVSP normal.  2.  Acute metabolic encephalopathy, possibly associated UTI.  Brain MRI  without acute stroke.  3.  Essential hypertension, recent systolics 630Z to 601U.  4.  Minor, flat elevation in high-sensitivity troponin I in the 40s most reflective of demand ischemia rather than ACS.  5.  Uncontrolled type 2 diabetes mellitus with hyperglycemia.  Hemoglobin A1c 8.3%.  Continue supportive measures per primary team with treatment for UTI.  Would continue Eliquis as long as she is able to take p.o.'s adequately.  Hold off on all AV nodal blockers and follow rhythm.  If heart rate ultimately picks up in sinus rhythm, could consider initiation of low-dose oral beta-blocker.  Signed, Rozann Lesches, MD  10/16/2021, 8:35 AM

## 2021-10-16 NOTE — NC FL2 (Signed)
[place] LEVEL OF CARE SCREENING TOOL     IDENTIFICATION  Patient Name: Shelly Jackson Birthdate: 1934/09/25 Sex: female Admission Date (Current Location): 10/13/2021  [facility] and [place] Number:  [employer] and Address:  [facility] [address] [place]      Provider Number: [id number]  Attending Physician Name and Address:  Barton Dubois, MD  Relative Name and Phone Number:  Sangeeta, Youse [phone])   [phone]    Current Level of Care: Hospital Recommended Level of Care: [place] Prior Approval Number:    Date Approved/Denied:   PASRR Number:    Discharge Plan: SNF    Current Diagnoses: Patient Active Problem List   Diagnosis Date Noted   Acquired hypothyroidism 10/16/2021   UTI (urinary tract infection) 82/50/5397   Acute metabolic encephalopathy 67/34/1937   Atrial flutter with rapid ventricular response ([place]) 10/13/2021   AKI (acute kidney injury) ([place]) 10/13/2021   Type 2 diabetes mellitus with hyperglycemia ([place]) 10/13/2021   Leukocytosis 10/13/2021   Prolonged QT interval 10/13/2021   Elevated troponin 10/13/2021   GERD (gastroesophageal reflux disease) 10/13/2021   Personal history of fall 07/15/2020   Hypokalemia 07/15/2020   Proliferative diabetic retinopathy of left eye with macular edema associated with type 2 diabetes mellitus ([place]) 11/19/2019   Retinal hemorrhage, bilateral 11/19/2019   Diabetic macular edema of right eye with proliferative retinopathy associated with type 2 diabetes mellitus ([place]) 11/19/2019   Adrenal adenoma 02/18/2017   Aortic atherosclerosis ([place]) 02/18/2017   Kidney stones 02/18/2017   Hematuria 02/18/2017   Diabetic retinopathy ([place]) 12/24/2014   Diabetic peripheral neuropathy ([place]) 04/25/2014   Essential hypertension 04/10/2013   Hyperlipemia, mixed 11/28/2012   Thrombocytopenia ([place]) 07/26/2011   Diverticulitis 07/24/2011   Polyp of colon 07/24/2011    Lower gastrointestinal bleed 07/23/2011   Acute blood loss anemia 07/23/2011   Sinus bradycardia 07/23/2011   Obesity 07/23/2011   DM type 2 (diabetes mellitus, type 2) ([place]) 07/23/2011    Orientation RESPIRATION BLADDER Height & Weight     Self, Place  Normal Incontinent Weight: 202 lb 2.6 oz (91.7 kg) Height:  '5\' 3"'$  (160 cm)  BEHAVIORAL SYMPTOMS/MOOD NEUROLOGICAL BOWEL NUTRITION STATUS      Incontinent Diet  AMBULATORY STATUS COMMUNICATION OF NEEDS Skin   Extensive Assist Verbally Normal                       Personal Care Assistance Level of Assistance  Bathing, Feeding, Dressing Bathing Assistance: Limited assistance Feeding assistance: Independent Dressing Assistance: Limited assistance     Functional Limitations Info  Sight, Hearing, Speech Sight Info: Adequate Hearing Info: Adequate Speech Info: Adequate    SPECIAL CARE FACTORS FREQUENCY  PT (By licensed PT)     PT Frequency: 5x/week              Contractures Contractures Info: Not present    Additional Factors Info  Code Status, Allergies, Psychotropic Code Status Info: Full Code Allergies Info: Daypro Psychotropic Info: Buspar         Current Medications (10/16/2021):  This is the current hospital active medication list Current Facility-Administered Medications  Medication Dose Route Frequency Provider Last Rate Last Admin   acetaminophen (TYLENOL) tablet 650 mg  650 mg Oral Q6H PRN Adefeso, Oladapo, DO   650 mg at 10/15/21 1331   Or   acetaminophen (TYLENOL) suppository 650 mg  650 mg Rectal Q6H PRN Adefeso, Oladapo, DO  apixaban (ELIQUIS) tablet 5 mg  5 mg Oral BID Adefeso, Oladapo, DO   5 mg at 10/16/21 1119   busPIRone (BUSPAR) tablet 5 mg  5 mg Oral BID Barton Dubois, MD   5 mg at 10/16/21 1119   cefTRIAXone (ROCEPHIN) 1 g in sodium chloride 0.9 % 100 mL IVPB  1 g Intravenous Q24H Barton Dubois, MD 200 mL/hr at 10/16/21 1122 1 g at 10/16/21 1122   Chlorhexidine Gluconate Cloth 2 %  PADS 6 each  6 each Topical Q0600 Adefeso, Oladapo, DO   6 each at 10/15/21 0431   citalopram (CELEXA) tablet 20 mg  20 mg Oral QHS Barton Dubois, MD   20 mg at 10/15/21 2141   insulin aspart (novoLOG) injection 0-5 Units  0-5 Units Subcutaneous QHS Adefeso, Oladapo, DO   2 Units at 10/15/21 2141   insulin aspart (novoLOG) injection 0-9 Units  0-9 Units Subcutaneous TID WC Adefeso, Oladapo, DO   3 Units at 10/16/21 1239   insulin glargine-yfgn (SEMGLEE) injection 12 Units  12 Units Subcutaneous QHS Barton Dubois, MD   12 Units at 10/15/21 2141   ipratropium-albuterol (DUONEB) 0.5-2.5 (3) MG/3ML nebulizer solution 3 mL  3 mL Nebulization Q4H PRN Adefeso, Oladapo, DO   3 mL at 10/14/21 0117   levothyroxine (SYNTHROID) tablet 50 mcg  50 mcg Oral Q0600 Barton Dubois, MD   50 mcg at 10/16/21 0655   pantoprazole (PROTONIX) EC tablet 40 mg  40 mg Oral Daily Adefeso, Oladapo, DO   40 mg at 10/16/21 1119   potassium chloride SA (KLOR-CON M) CR tablet 40 mEq  40 mEq Oral Once Barton Dubois, MD         Discharge Medications: Please see discharge summary for a list of discharge medications.  Relevant Imaging Results:  Relevant Lab Results:   Additional Information SSN [id number] Clydene Pugh, LCSW

## 2021-10-16 NOTE — Progress Notes (Signed)
Progress Note   Patient: Shelly Jackson HUT:[id number] DOB: [birth year] DOA: 10/13/2021     2 DOS: the patient was seen and examined on 10/16/2021   Brief hospital course: As per H&P written by Dr. Josephine Cables on 10/13/2021 Shelly Jackson is an 87 y.o. female with medical history significant of GERD, T2DM, hypertension, hyperlipidemia who presents to the emergency department from her PCPs office due to palpitations.  Patient states that she went for a routine checkup at her primary care's office and she was noted to have an increased heart rate (150s), so she was sent to the ED for further evaluation.  Patient denies chest pain, shortness of breath, headache, fever, chills, nausea, vomiting, abdominal pain, blurry vision or lightheadedness.   ED Course:  In the emergency department, she was intermittently tachypneic and was initially tachycardic.  BP was 162/114, temperature 96.62F and O2 sat was 95% on room air.  Work-up in the ED showed normal CBC except for leukocytosis, and normal BMP except for hyperglycemia and BUN/creatinine 30/1.17 (baseline creatinine 0.7 x 0.9).  Troponin x1 - 48. Chest x-ray showed no active disease Patient was noted to be in atrial flutter and IV adenosine was initially given.  She was subsequently given IV loading dose of Cardizem and subsequently placed on IV Cardizem drip.  Hospitalist was asked to admit patient for further evaluation and management.  Assessment and Plan: * Atrial flutter with rapid ventricular response [facility]) -Cardiology service consulted; appreciate recommendations and assistance with care. -Patient was transition off Cardizem drip and IV Lopressor; currently has sleep in 2 junctional bradycardia -Cardiology has recommended to hold off on any AV nodal blocker and to assess rate/rhythm with intention to use low-dose beta-blocker if needed. -Continue telemetry monitoring. -Continue the use of Eliquis for secondary prevention. -Continue the use of Synthroid  for diagnosed hypothyroidism.   -2D echo with preserved ejection fraction.  No wall motion abnormalities.  Mild left ventricular hypertrophy and indeterminate diastolic ventricular parameters. -Patient without complaints of chest pain.   Obesity -Low calorie diet and portion control discussed with patient -Body mass index is 36.24 kg/m.   Acquired hypothyroidism - Continue Synthroid -Repeat thyroid panel in 3 months with further adjustment to Synthroid dose as needed.  UTI (urinary tract infection) - Patient has been empirically started on Rocephin -Culture has been sent out prior to antibiotics initiation and will follow sensitivity and results. -Maintain adequate hydration -After discussing with patient's son he expressed similar presentation (encephalopathy) in the past while having acute UTI.  Acute metabolic encephalopathy - Given presentation with atrial flutter/A-fib CT scan without contrast has rule out acute bleeding abnormalities. -MRI also has demonstrated no acute stroke. -Urinalysis with concern for UTI; empirically treating with Rocephin -Follow clinical response. -Continue to minimize sedative agents and the use of psychotropic medications. -Consult reorientation will be provided.  GERD (gastroesophageal reflux disease) -Continue PPI.  Elevated troponin -In the setting of demand ischemia from atrial flutter/A-fib with RVR. -Patient denies chest pain. -Flat elevation appreciated. -We will follow recommendations by cardiology service -2D echo reassuring; no wall motion abnormalities appreciated and preserved ejection fraction seen.  Prolonged QT interval -Stabilize electrolytes (goal is for potassium above 4 and magnesium above 2) -Minimize medications that can further prolong QT -Continue telemetry monitoring.  Leukocytosis -Appears to be in the setting of demargination -Patient urinalysis with concern for UTI. -Continue the use of Rocephin and follow  urine culture results. -Continue to follow WBCs trend. -Continue to maintain adequate hydration and follow  clinical response.   Type 2 diabetes mellitus with hyperglycemia (HCC) -A1c 8.3 -Continue sliding scale insulin and Semglee while inpatient -Follow CBGs fluctuation with further adjustment to hypoglycemia regimen as required.  AKI (acute kidney injury) ([place]) -In the setting of prerenal azotemia most likely -Improved/resolved after fluid resuscitation -Current creatinine 0.7  -continue to follow renal function trend intermittently.  Hypokalemia -Follow electrolytes and further replete as needed -Magnesium level within normal limits.  Essential hypertension - Blood pressure intermittently fluctuating; but for the most part stable. -We will be holding Lopressor and Cardizem as recommended by cardiology service.   -Continue as needed hydralazine. -Heart healthy diet has been ordered.   Subjective:  No fever, no nausea, no vomiting, no chest pain.  Reports no palpitations currently.  Still sleepy/somnolent this morning after receiving some Ativan overnight due to agitation.  Definitely more arousable and able to follow commands today.  Physical Exam: Vitals:   10/16/21 0700 10/16/21 0800 10/16/21 0900 10/16/21 1000  BP: (!) 155/68 (!) 162/78 (!) 151/69 (!) 172/79  Pulse: (!) 48 (!) 53 (!) 47 (!) 57  Resp: 19 19 (!) 21 20  Temp: (!) 96.7 F (35.9 C)     TempSrc: Axillary     SpO2: 100% 100% 100% 99%  Weight:      Height:       General exam: Afebrile, arousable and in no acute distress.  Denies chest pain, no palpitations.  Patient reports no nausea or vomiting.  Overnight with some agitation that ended requiring the use of lorazepam. Respiratory system: Good air movement bilaterally, no using accessory muscles, no wheezing or crackles. Cardiovascular system: Sinus bradycardia on today's examination; no rubs, no gallops, no JVD on exam. Gastrointestinal system: Abdomen is  obese, nondistended, soft and nontender. No organomegaly or masses felt. Normal bowel sounds heard. Central nervous system: No focal neurological deficits. Extremities: No cyanosis or clubbing; trace edema appreciated bilaterally. Skin: No petechiae. Psychiatry: Judgement and insight appear stable; but limited exam secondary to somnolence.  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 -CBG 160-200 range.  Family Communication: Son and pastor at bedside.  Disposition: Status is: Inpatient Remains inpatient appropriate because: Ongoing acute metabolic encephalopathy with atrial flutter with RVR.   Planned Discharge Destination: Home with Home Health   Author: Barton Dubois, MD 10/16/2021 11:43 AM  For on call review [email or web address].

## 2021-10-16 NOTE — Progress Notes (Addendum)
Physical Therapy Treatment Patient Details Name: Shelly Jackson MRN: [id number] DOB: 06/27/1934 Today's Date: 10/16/2021   History of Present Illness Shelly Jackson is an 87 y.o. female with medical history significant of GERD, T2DM, hypertension, hyperlipidemia who presents to the emergency department from her PCPs office due to palpitations.  Patient states that she went for a routine checkup at her primary care's office and she was noted to have an increased heart rate (150s), so she was sent to the ED for further evaluation.  Patient denies chest pain, shortness of breath, headache, fever, chills, nausea, vomiting, abdominal pain, blurry vision or lightheadedness    PT Comments    Patient presents sitting in chair on 2 LPM and consents to PT treatment session. Treatment session involved patient participating on room air. Patient tolerated therapeutic exercises but appears lethargic with needing frequent breaks and motivation to complete exercises. Also limited by fatigue. Patient is min assist with transfers and use of RW. Continues to demonstrate slow cadence with difficulty weight shifting and needing verbal/tactile cues. Patient still limited to a few steps ambulating in room with mod assist needed and shuffling pattern observed. Vitals monitored throughout session with patient maintaining normal levels in therapeutic range. Patient left in bed with family present and nursing staff notified of mobility status. Patient will benefit from continued skilled physical therapy in hospital and recommended venue below to increase strength, balance, endurance for safe ADLs and gait.    Recommendations for follow up therapy are one component of a multi-disciplinary discharge planning process, led by the attending physician.  Recommendations may be updated based on patient status, additional functional criteria and insurance authorization.  Follow Up Recommendations  Skilled nursing-short term rehab (<3  hours/day)     Assistance Recommended at Discharge Set up Supervision/Assistance  Patient can return home with the following A little help with walking and/or transfers;Help with stairs or ramp for entrance;A little help with bathing/dressing/bathroom;Assistance with cooking/housework   Equipment Recommendations  None recommended by PT    Recommendations for Other Services       Precautions / Restrictions Precautions Precautions: Fall Restrictions Weight Bearing Restrictions: No     Mobility  Bed Mobility Overal bed mobility: Needs Assistance Bed Mobility: Sit to Supine     Supine to sit: Min assist, Mod assist Sit to supine: Mod assist   General bed mobility comments: Mod assist sit to supine with LE weakness and poor trunk control observed.    Transfers Overall transfer level: Needs assistance Equipment used: Rolling walker (2 wheels) Transfers: Sit to/from Stand, Bed to chair/wheelchair/BSC Sit to Stand: Min assist   Step pivot transfers: Min assist       General transfer comment: Min assist transfers with RW. Slow cadence with verbal cueing needed for technical form and safety. Difficulty weight shifting    Ambulation/Gait Ambulation/Gait assistance: Mod assist Gait Distance (Feet): 5 Feet Assistive device: Rolling walker (2 wheels) Gait Pattern/deviations: Shuffle, Decreased step length - right, Decreased step length - left, Step-to pattern, Decreased stride length, Decreased weight shift to right, Decreased weight shift to left Gait velocity: decreased     General Gait Details: Limited to a few steps in room with RW and mod assist. Shuffling gait pattern still present with vitals monitored.   Stairs             Wheelchair Mobility    Modified Rankin (Stroke Patients Only)       Balance Overall balance assessment: Needs assistance Sitting-balance support: Bilateral  upper extremity supported, Feet supported Sitting balance-Leahy Scale:  Fair Sitting balance - Comments: Patient struggled with maintaining upright position during treatment at edge of chair during session   Standing balance support: Bilateral upper extremity supported, During functional activity, Reliant on assistive device for balance Standing balance-Leahy Scale: Fair Standing balance comment: Balance and coordination deficits still present. Narrow uneven stance with patient struggling to weight shifting and reach outside BOS                            Cognition Arousal/Alertness: Awake/alert Behavior During Therapy: WFL for tasks assessed/performed Overall Cognitive Status: Within Functional Limits for tasks assessed                                          Exercises General Exercises - Lower Extremity Long Arc Quad: 10 reps, Both, Strengthening, AROM, Seated Hip Flexion/Marching: 5 reps, Both, Strengthening, AROM, Seated Toe Raises: AROM, Strengthening, Both, 15 reps, Seated Heel Raises: AROM, Strengthening, 15 reps, Seated, Both    General Comments        Pertinent Vitals/Pain Pain Assessment Pain Assessment: No/denies pain    Home Living                          Prior Function            PT Goals (current goals can now be found in the care plan section) Acute Rehab PT Goals Patient Stated Goal: return home PT Goal Formulation: With patient Time For Goal Achievement: 10/22/21 Progress towards PT goals: Progressing toward goals    Frequency    Min 3X/week      PT Plan Current plan remains appropriate    Co-evaluation              AM-PAC PT "6 Clicks" Mobility   Outcome Measure  Help needed turning from your back to your side while in a flat bed without using bedrails?: A Little Help needed moving from lying on your back to sitting on the side of a flat bed without using bedrails?: A Little Help needed moving to and from a bed to a chair (including a wheelchair)?: A Little Help  needed standing up from a chair using your arms (e.g., wheelchair or bedside chair)?: A Lot Help needed to walk in hospital room?: A Lot Help needed climbing 3-5 steps with a railing? : A Lot 6 Click Score: 15    End of Session   Activity Tolerance: Patient tolerated treatment well;Patient limited by fatigue;No increased pain Patient left: in chair;with call bell/phone within reach Nurse Communication: Mobility status PT Visit Diagnosis: Unsteadiness on feet (R26.81);Other abnormalities of gait and mobility (R26.89);Muscle weakness (generalized) (M62.81)     Time: 2355-7322 PT Time Calculation (min) (ACUTE ONLY): 25 min  Charges:  $Therapeutic Exercise: 8-22 mins $Therapeutic Activity: 8-22 mins                     3:07 PM, 10/16/21 Lestine Box, S/PT

## 2021-10-17 LAB — BASIC METABOLIC PANEL
Anion gap: 7 (ref 5–15)
BUN: 23 mg/dL (ref 8–23)
CO2: 27 mmol/L (ref 22–32)
Calcium: 7.9 mg/dL — ABNORMAL LOW (ref 8.9–10.3)
Chloride: 104 mmol/L (ref 98–111)
Creatinine, Ser: 0.99 mg/dL (ref 0.44–1.00)
GFR, Estimated: 56 mL/min — ABNORMAL LOW (ref >60)
Glucose, Bld: 126 mg/dL — ABNORMAL HIGH (ref 70–99)
Potassium: 3.9 mmol/L (ref 3.5–5.1)
Sodium: 138 mmol/L (ref 135–145)

## 2021-10-17 LAB — GLUCOSE, CAPILLARY
Glucose-Capillary: 195 mg/dL — ABNORMAL HIGH (ref 70–99)
Glucose-Capillary: 242 mg/dL — ABNORMAL HIGH (ref 70–99)
Glucose-Capillary: 193 mg/dL — ABNORMAL HIGH (ref 70–99)
Glucose-Capillary: 184 mg/dL — ABNORMAL HIGH (ref 70–99)

## 2021-10-17 LAB — CBC
HCT: 37.6 % (ref 36.0–46.0)
Hemoglobin: 11.8 g/dL — ABNORMAL LOW (ref 12.0–15.0)
MCH: 29.7 pg (ref 26.0–34.0)
MCHC: 31.4 g/dL (ref 30.0–36.0)
MCV: 94.7 fL (ref 80.0–100.0)
Platelets: 183 10*3/uL (ref 150–400)
RBC: 3.97 MIL/uL (ref 3.87–5.11)
RDW: 14.4 % (ref 11.5–15.5)
WBC: 8.3 10*3/uL (ref 4.0–10.5)
nRBC: 0 % (ref 0.0–0.2)

## 2021-10-17 MED ORDER — HYDRALAZINE HCL 20 MG/ML IJ SOLN
10.0000 mg | Freq: Four times a day (QID) | INTRAMUSCULAR | Status: DC | PRN
  Administered 2021-10-18 – 2021-10-19 (×2): 10 mg via INTRAVENOUS
  Filled 2021-10-17 (×2): qty 1

## 2021-10-17 NOTE — Progress Notes (Signed)
Progress Note   Patient: Shelly Jackson ZOX:[id number] DOB: Jul 07, 1934 DOA: 10/13/2021     3 DOS: the patient was seen and examined on 10/17/2021   Brief hospital course: As per H&P written by Dr. Josephine Cables on 10/13/2021 Shelly Jackson is an 87 y.o. female with medical history significant of GERD, T2DM, hypertension, hyperlipidemia who presents to the emergency department from her PCPs office due to palpitations.  Patient states that she went for a routine checkup at her primary care's office and she was noted to have an increased heart rate (150s), so she was sent to the ED for further evaluation.  Patient denies chest pain, shortness of breath, headache, fever, chills, nausea, vomiting, abdominal pain, blurry vision or lightheadedness.   ED Course:  In the emergency department, she was intermittently tachypneic and was initially tachycardic.  BP was 162/114, temperature 96.87F and O2 sat was 95% on room air.  Work-up in the ED showed normal CBC except for leukocytosis, and normal BMP except for hyperglycemia and BUN/creatinine 30/1.17 (baseline creatinine 0.7 x 0.9).  Troponin x1 - 48. Chest x-ray showed no active disease Patient was noted to be in atrial flutter and IV adenosine was initially given.  She was subsequently given IV loading dose of Cardizem and subsequently placed on IV Cardizem drip.  Hospitalist was asked to admit patient for further evaluation and management.  Assessment and Plan: * Atrial flutter with rapid ventricular response [facility]) -Cardiology service consulted; appreciate recommendations and assistance with care. -Patient was transition off Cardizem drip and IV Lopressor; currently has developed  junctional bradycardia. And currently sinus rhythm with control rate. -Cardiology has recommended to hold off on any AV nodal blocker and to assess rate/rhythm with intention to use low-dose beta-blocker if needed. -Continue telemetry monitoring. -Continue the use of Eliquis for  secondary prevention. -Continue the use of Synthroid for diagnosed hypothyroidism.   -2D echo with preserved ejection fraction.  No wall motion abnormalities.  Mild left ventricular hypertrophy and indeterminate diastolic ventricular parameters. -Patient without complaints of chest pain or palpitations..   Obesity -Low calorie diet and portion control discussed with patient -Body mass index is 36.24 kg/m.   Acquired hypothyroidism -Continue Synthroid -Repeat thyroid panel in 3 months with further adjustment to Synthroid dose as needed.  UTI (urinary tract infection) - Patient has been empirically started on Rocephin -Culture has been sent out prior to antibiotics initiation and will follow sensitivity and results. -Maintain adequate hydration -After discussing with patient's son he expressed similar presentation (encephalopathy) in the past while having acute UTI.  Acute metabolic encephalopathy - Given presentation with atrial flutter/A-fib CT scan without contrast has rule out acute bleeding abnormalities. -MRI also has demonstrated no acute stroke. -Urinalysis with concern for UTI; empirically treating with Rocephin -Follow clinical response. -Continue to minimize sedative agents and the use of psychotropic medications. -Constant reorientation to be provided. -oriented X 3 today.  GERD (gastroesophageal reflux disease) -Continue PPI.  Elevated troponin -In the setting of demand ischemia from atrial flutter/A-fib with RVR. -Patient denies chest pain. -Flat elevation appreciated. -will follow recommendations by cardiology service -2D echo reassuring; no wall motion abnormalities appreciated and preserved ejection fraction seen.  Prolonged QT interval -Stabilize electrolytes (goal is for potassium above 4 and magnesium above 2) -Minimize medications that can further prolong QT -Continue telemetry monitoring.  Leukocytosis -Appears to be in the setting of  demargination -Patient urinalysis with concern for UTI. -Continue the use of Rocephin and follow urine culture results. -Continue to  follow WBCs trend; now WNL 8.3 -Continue to maintain adequate hydration and follow clinical response.   Type 2 diabetes mellitus with hyperglycemia (HCC) -A1c 8.3 -Continue sliding scale insulin and Semglee while inpatient -Follow CBGs fluctuation with further adjustment to hypoglycemia regimen as required.  AKI (acute kidney injury) ([place]) -In the setting of prerenal azotemia most likely -Improved/resolved after fluid resuscitation -continue to follow renal function trend intermittently. -patient advised to maintain adequate hydration.   Hypokalemia -Follow electrolytes and further replete as needed -Magnesium level within normal limits.  Essential hypertension - Blood pressure intermittently fluctuating; but for the most part stable. -Will continue holding Lopressor and Cardizem as recommended by cardiology service.   -Continue as needed hydralazine. -Heart healthy/low sodium diet has been ordered.   Subjective:  No fever, no CP, no palpitations. Oriented x 3 and reporting feeling weak and tired.  Physical Exam: Vitals:   10/17/21 0400 10/17/21 0409 10/17/21 0749 10/17/21 1146  BP: (!) 139/96     Pulse:  (!) 57    Resp: 15 16    Temp:   98.2 F (36.8 C) 98.4 F (36.9 C)  TempSrc:   Oral Oral  SpO2: 100% 99%    Weight:      Height:       General exam: Alert, awake, oriented x 3; no CP, no palpitation. Respiratory system: Clear to auscultation. Respiratory effort normal. Good sat on RA. Cardiovascular system: sinus rhythm, no rubs, no gallops Gastrointestinal system: Abdomen is nondistended, soft and nontender. No organomegaly or masses felt. Normal bowel sounds heard. Central nervous system: Alert and oriented. No focal neurological deficits. Extremities: No cyanosis, no clubbing. Skin: No petechiae. Psychiatry: Judgement and insight  appear normal. Mood & affect appropriate.     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 -CBC: WBC 8.3, Hgb 11.8; platelets count 183K BMET: sodium 138, potassium 3.9, BUN 23 and Cr 0.99  Family Communication: Son and daughter in law at bedside.  Disposition: Status is: Inpatient Remains inpatient appropriate because: Ongoing acute metabolic encephalopathy with atrial flutter with RVR.   Planned Discharge Destination: Home with Home Health   Author: Barton Dubois, MD 10/17/2021 3:25 PM  For on call review [email or web address].

## 2021-10-18 LAB — URINE CULTURE
Culture: >=100000 — AB
Special Requests: NORMAL

## 2021-10-18 LAB — GLUCOSE, CAPILLARY
Glucose-Capillary: 169 mg/dL — ABNORMAL HIGH (ref 70–99)
Glucose-Capillary: 150 mg/dL — ABNORMAL HIGH (ref 70–99)
Glucose-Capillary: 228 mg/dL — ABNORMAL HIGH (ref 70–99)
Glucose-Capillary: 161 mg/dL — ABNORMAL HIGH (ref 70–99)
Glucose-Capillary: 178 mg/dL — ABNORMAL HIGH (ref 70–99)

## 2021-10-18 MED ORDER — LOSARTAN POTASSIUM 50 MG PO TABS
25.0000 mg | ORAL_TABLET | Freq: Every day | ORAL | Status: DC
  Administered 2021-10-18 – 2021-10-19 (×2): 25 mg via ORAL
  Filled 2021-10-18 (×2): qty 1

## 2021-10-18 MED ORDER — MELATONIN 3 MG PO TABS
6.0000 mg | ORAL_TABLET | Freq: Once | ORAL | Status: AC
  Administered 2021-10-18: 6 mg via ORAL
  Filled 2021-10-18: qty 2

## 2021-10-18 MED ORDER — CEFDINIR 300 MG PO CAPS
300.0000 mg | ORAL_CAPSULE | Freq: Two times a day (BID) | ORAL | Status: DC
  Administered 2021-10-18 – 2021-10-19 (×3): 300 mg via ORAL
  Filled 2021-10-18 (×3): qty 1

## 2021-10-18 NOTE — Progress Notes (Signed)
Progress Note   Patient: Shelly Jackson:[id number] DOB: 08-25-1934 DOA: 10/13/2021     4 DOS: the patient was seen and examined on 10/18/2021   Brief hospital course: As per H&P written by Dr. Josephine Cables on 10/13/2021 Shelly Jackson is an 87 y.o. female with medical history significant of GERD, T2DM, hypertension, hyperlipidemia who presents to the emergency department from her PCPs office due to palpitations.  Patient states that she went for a routine checkup at her primary care's office and she was noted to have an increased heart rate (150s), so she was sent to the ED for further evaluation.  Patient denies chest pain, shortness of breath, headache, fever, chills, nausea, vomiting, abdominal pain, blurry vision or lightheadedness.   ED Course:  In the emergency department, she was intermittently tachypneic and was initially tachycardic.  BP was 162/114, temperature 96.5F and O2 sat was 95% on room air.  Work-up in the ED showed normal CBC except for leukocytosis, and normal BMP except for hyperglycemia and BUN/creatinine 30/1.17 (baseline creatinine 0.7 x 0.9).  Troponin x1 - 48. Chest x-ray showed no active disease Patient was noted to be in atrial flutter and IV adenosine was initially given.  She was subsequently given IV loading dose of Cardizem and subsequently placed on IV Cardizem drip.  Hospitalist was asked to admit patient for further evaluation and management.  Assessment and Plan: * Atrial flutter with rapid ventricular response [facility]) -Cardiology service consulted; appreciate recommendations and assistance with care. -Patient was transition off Cardizem drip and IV Lopressor; currently has developed  junctional bradycardia. And currently sinus rhythm with control rate. -Cardiology has recommended to hold off on any AV nodal blocker and to assess rate/rhythm with intention to use low-dose beta-blocker if needed. -Will continue telemetry monitoring. -Continue the use of Eliquis for  secondary prevention. -Continue the use of Synthroid for diagnosed hypothyroidism.   -2D echo with preserved ejection fraction.  No wall motion abnormalities.  Mild left ventricular hypertrophy and indeterminate diastolic ventricular parameters. -Patient without complaints of chest pain or palpitations..   Obesity -Low calorie diet and portion control discussed with patient -Body mass index is 36.24 kg/m.   Acquired hypothyroidism -Continue Synthroid -Repeat thyroid panel in 3 months with further adjustment to Synthroid dose as needed.  UTI (urinary tract infection) - Patient has been empirically started on Rocephin -Culture has been sent out prior to antibiotics initiation and will follow sensitivity and results. -Maintain adequate hydration -After discussing with patient's son he expressed similar presentation (encephalopathy) in the past while having acute UTI.  Acute metabolic encephalopathy - Given presentation with atrial flutter/A-fib CT scan without contrast has rule out acute bleeding abnormalities. -MRI also has demonstrated no acute stroke. -Urinalysis with concern for UTI; empirically treating with Rocephin; will transition to cefdinir to complete therapy. -Follow clinical response. -Continue to minimize sedative agents and the use of psychotropic medications. -Constant reorientation to be provided. -oriented X 3 today.  GERD (gastroesophageal reflux disease) -Continue PPI.  Elevated troponin -In the setting of demand ischemia from atrial flutter/A-fib with RVR. -Patient denies chest pain. -Flat elevation appreciated. -will follow recommendations by cardiology service -2D echo reassuring; no wall motion abnormalities appreciated and preserved ejection fraction seen.  Prolonged QT interval -Stabilize electrolytes (goal is for potassium above 4 and magnesium above 2) -Minimize medications that can further prolong QT -Continue telemetry  monitoring.  Leukocytosis -Appears to be in the setting of demargination -Patient urinalysis with concern for UTI. -Continue the use of  Rocephin and follow urine culture results. -Continue to follow WBCs trend; now WNL 8.3 -Continue to maintain adequate hydration and follow clinical response.   Type 2 diabetes mellitus with hyperglycemia (HCC) -A1c 8.3 -Continue sliding scale insulin and Semglee while inpatient -Follow CBGs fluctuation with further adjustment to hypoglycemia regimen as required.  AKI (acute kidney injury) ([place]) -In the setting of prerenal azotemia most likely -Improved/resolved after fluid resuscitation -continue to follow renal function trend intermittently. -patient advised to maintain adequate hydration.   Hypokalemia -Follow electrolytes and further replete as needed -Magnesium level within normal limits.  Essential hypertension - Blood pressure intermittently fluctuating arising -Continue as needed hydralazine and start daily Cozaar. -Heart healthy/low sodium diet has been ordered. -Follow BP and further adjust antihypertensive treatment as needed.   Subjective:  No fever, no chest pain, no palpitations.  Oriented x3, following commands and reporting no nausea or vomiting.  Elevated blood pressure overnight reports no dysuria.  Physical Exam: Vitals:   10/18/21 0751 10/18/21 0900 10/18/21 1100 10/18/21 1129  BP:      Pulse:  63 62   Resp:  19 19   Temp: 97.9 F (36.6 C)   97.6 F (36.4 C)  TempSrc: Oral   Oral  SpO2:  100% 100%   Weight:      Height:      General exam: Alert, awake, oriented x 3, afebrile, no chest pain, no nausea, no vomiting.  Following commands appropriately.  Demonstrating uncontrolled blood pressure throughout the night.  Rate remains stable and patient denies chest pain or palpitations. Respiratory system: Clear to auscultation. Respiratory effort normal.  Good saturation on room air; no using accessory. Cardiovascular  system:RRR. No murmurs, rubs, gallops. Gastrointestinal system: Abdomen is nondistended, soft and nontender. No organomegaly or masses felt. Normal bowel sounds heard. Central nervous system: Alert and oriented. No focal neurological deficits. Extremities: No cyanosis or clubbing. Skin: No petechiae. Psychiatry: Judgement and insight appear normal. Mood & affect appropriate.    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 CBG 150-220 Latest CBC: WBC 8.3, Hgb 11.8; platelets count 183K Most recent BMET: sodium 138, potassium 3.9, BUN 23 and Cr 0.99  Family Communication: Son and daughter in law at bedside.  Disposition: Status is: Inpatient Remains inpatient appropriate because: Ongoing acute metabolic encephalopathy with atrial flutter with RVR.   Planned Discharge Destination: Home with Home Health   Author: Barton Dubois, MD 10/18/2021 1:31 PM  For on call review [email or web address].

## 2021-10-19 DIAGNOSIS — E039 Hypothyroidism, unspecified: Secondary | ICD-10-CM

## 2021-10-19 DIAGNOSIS — Z22322 Carrier or suspected carrier of Methicillin resistant Staphylococcus aureus: Secondary | ICD-10-CM

## 2021-10-19 DIAGNOSIS — N390 Urinary tract infection, site not specified: Secondary | ICD-10-CM

## 2021-10-19 DIAGNOSIS — I480 Paroxysmal atrial fibrillation: Secondary | ICD-10-CM

## 2021-10-19 LAB — GLUCOSE, CAPILLARY
Glucose-Capillary: 150 mg/dL — ABNORMAL HIGH (ref 70–99)
Glucose-Capillary: 230 mg/dL — ABNORMAL HIGH (ref 70–99)
Glucose-Capillary: 236 mg/dL — ABNORMAL HIGH (ref 70–99)
Glucose-Capillary: 145 mg/dL — ABNORMAL HIGH (ref 70–99)

## 2021-10-19 LAB — BASIC METABOLIC PANEL
Anion gap: 6 (ref 5–15)
BUN: 17 mg/dL (ref 8–23)
CO2: 29 mmol/L (ref 22–32)
Calcium: 8.5 mg/dL — ABNORMAL LOW (ref 8.9–10.3)
Chloride: 104 mmol/L (ref 98–111)
Creatinine, Ser: 0.74 mg/dL (ref 0.44–1.00)
GFR, Estimated: >60 mL/min (ref >60)
Glucose, Bld: 152 mg/dL — ABNORMAL HIGH (ref 70–99)
Potassium: 3.7 mmol/L (ref 3.5–5.1)
Sodium: 139 mmol/L (ref 135–145)

## 2021-10-19 MED ORDER — METOPROLOL TARTRATE 5 MG/5ML IV SOLN: 5.0000 mg | Freq: Once | INTRAVENOUS | Status: DC

## 2021-10-19 MED ORDER — APIXABAN 5 MG PO TABS: 5.0000 mg | ORAL_TABLET | Freq: Two times a day (BID) | ORAL | 2 refills | Status: DC

## 2021-10-19 MED ORDER — LANTUS SOLOSTAR 100 UNIT/ML ~~LOC~~ SOPN: 15.0000 [IU] | PEN_INJECTOR | Freq: Every day | SUBCUTANEOUS | 1 refills | Status: AC

## 2021-10-19 MED ORDER — DILTIAZEM HCL 25 MG/5ML IV SOLN
15.0000 mg | Freq: Once | INTRAVENOUS | Status: AC
  Administered 2021-10-19: 15 mg via INTRAVENOUS
  Filled 2021-10-19: qty 5

## 2021-10-19 MED ORDER — METOPROLOL TARTRATE 25 MG PO TABS
25.0000 mg | ORAL_TABLET | Freq: Two times a day (BID) | ORAL | Status: DC
  Administered 2021-10-19: 25 mg via ORAL
  Filled 2021-10-19: qty 1

## 2021-10-19 MED ORDER — ACETAMINOPHEN 325 MG PO TABS: 650.0000 mg | ORAL_TABLET | Freq: Four times a day (QID) | ORAL | Status: AC | PRN

## 2021-10-19 MED ORDER — METOPROLOL TARTRATE 25 MG PO TABS: 25.0000 mg | ORAL_TABLET | Freq: Two times a day (BID) | ORAL | 1 refills | Status: DC

## 2021-10-19 MED ORDER — LEVOTHYROXINE SODIUM 50 MCG PO TABS: 50.0000 ug | ORAL_TABLET | Freq: Every day | ORAL | 2 refills | Status: DC

## 2021-10-19 MED ORDER — METOPROLOL TARTRATE 5 MG/5ML IV SOLN
5.0000 mg | Freq: Once | INTRAVENOUS | Status: AC
  Administered 2021-10-19: 5 mg via INTRAVENOUS
  Filled 2021-10-19: qty 5

## 2021-10-19 MED ORDER — LOSARTAN POTASSIUM 25 MG PO TABS: 25.0000 mg | ORAL_TABLET | Freq: Every day | ORAL | 1 refills | Status: DC

## 2021-10-19 MED ORDER — CEFDINIR 300 MG PO CAPS: 300.0000 mg | ORAL_CAPSULE | Freq: Two times a day (BID) | ORAL | 0 refills | Status: AC

## 2021-10-19 NOTE — Progress Notes (Signed)
Patient discharged via EMS. IV removed. Telemetry removed. CPAP and personal walker sent with patient via EMS. Purse was found in cabinet drawer, Shelly Jackson notified and stated he will pick up tomorrow.

## 2021-10-19 NOTE — Progress Notes (Signed)
   10/19/21 0859  Vitals  BP (!) 127/110  MAP (mmHg) 118  Pulse Rate (!) 137  Resp 18  MEWS COLOR  MEWS Score Color Yellow  Pain Assessment  Pain Scale 0-10  Pain Score 0  MEWS Score  MEWS Temp 0  MEWS Systolic 0  MEWS Pulse 3  MEWS RR 0  MEWS LOC 0  MEWS Score 3  Provider Notification  Provider Name/Title Barton Dubois MD  Date Provider Notified 10/19/21  Time Provider Notified 0915  Method of Notification Page  Notification Reason Other (Comment) (Yellow MEWS)  Provider response See new orders  Date of Provider Response 10/19/21  Time of Provider Response [phone]

## 2021-10-19 NOTE — Discharge Summary (Signed)
Physician Discharge Summary   Patient: Shelly Jackson MRN: [id number] DOB: [birth year]  Admit date:     10/13/2021  Discharge date: 10/19/21  Discharge Physician: Barton Dubois   PCP: Kathyrn Drown, MD   Recommendations at discharge:  Repeat basic metabolic panel to follow electrolytes and renal function in 1 week Repeat CBC in 1 week to follow hemoglobin trend/stability. Reassess blood pressure and adjust antihypertensive treatment as needed Continue to follow CBGs with further adjustment to hypoglycemic regimen as required Outpatient follow-up with cardiology service. Repeat thyroid panel in 3 months for further adjustment to replacement therapy as needed.  Discharge Diagnoses: Principal Problem:   Atrial flutter with rapid ventricular response (HCC) Active Problems:   Obesity   Essential hypertension   Hypokalemia   AKI (acute kidney injury) ([place])   Type 2 diabetes mellitus with hyperglycemia (HCC)   Leukocytosis   Prolonged QT interval   Elevated troponin   GERD (gastroesophageal reflux disease)   Acute metabolic encephalopathy   UTI (urinary tract infection)   Acquired hypothyroidism  Hospital Course: As per H&P written by Dr. Josephine Cables on 10/13/2021 Shelly Jackson is an 87 y.o. female with medical history significant of GERD, T2DM, hypertension, hyperlipidemia who presents to the emergency department from her PCPs office due to palpitations.  Patient states that she went for a routine checkup at her primary care's office and she was noted to have an increased heart rate (150s), so she was sent to the ED for further evaluation.  Patient denies chest pain, shortness of breath, headache, fever, chills, nausea, vomiting, abdominal pain, blurry vision or lightheadedness.   ED Course:  In the emergency department, she was intermittently tachypneic and was initially tachycardic.  BP was 162/114, temperature 96.63F and O2 sat was 95% on room air.  Work-up in the ED showed normal CBC  except for leukocytosis, and normal BMP except for hyperglycemia and BUN/creatinine 30/1.17 (baseline creatinine 0.7 x 0.9).  Troponin x1 - 48. Chest x-ray showed no active disease Patient was noted to be in atrial flutter and IV adenosine was initially given.  She was subsequently given IV loading dose of Cardizem and subsequently placed on IV Cardizem drip.  Hospitalist was asked to admit patient for further evaluation and management.  Assessment and Plan: * Atrial flutter with rapid ventricular response [facility]) -Cardiology service consulted; appreciate recommendations and assistance with care. -Patient was transition off Cardizem drip and IV Lopressor; she developed  junctional bradycardia and was temporary off any controlling agents. Then re-experienced A. Fib. -Now discharge on lopressor BID and eliquis for secondary prevention. -Outpatient follow-up with cardiology service. -Continue the use of Synthroid for diagnosed hypothyroidism.   -2D echo with preserved ejection fraction.  No wall motion abnormalities.  Mild left ventricular hypertrophy and indeterminate diastolic ventricular parameters. -Patient without complaints of chest pain or palpitations..   Obesity -Low calorie diet and portion control discussed with patient -Body mass index is 36.24 kg/m.   MRSA colonization - Patient received treatment with Bactroban and chlorhexidine pads as per decolonization protocol inside hospital.  Acquired hypothyroidism -Continue Synthroid -Repeat thyroid panel in 3 months with further adjustment to Synthroid dose as needed.  E. coli UTI (urinary tract infection) - Patient has been empirically started on Rocephin -Culture has been sent out prior to antibiotics initiation and will follow sensitivity and results. -Maintain adequate hydration -After discussing with patient's son he expressed similar presentation (encephalopathy) in the past while having acute UTI.  Acute metabolic  encephalopathy -  Given presentation with atrial flutter/A-fib CT scan without contrast has rule out acute bleeding abnormalities. -MRI also has demonstrated no acute stroke. -Urinalysis with concern for UTI; empirically treating with Rocephin; will transition to cefdinir to complete therapy. -Follow clinical response. -Continue to minimize sedative agents and the use of psychotropic medications. -Constant reorientation to be provided. -oriented X 3 and following commands appropriately.  GERD (gastroesophageal reflux disease) -Continue PPI.  Elevated troponin -In the setting of demand ischemia from atrial flutter/A-fib with RVR. -Patient denies chest pain. -Flat elevation appreciated. -will follow recommendations by cardiology service. -2D echo reassuring; no wall motion abnormalities appreciated and preserved ejection fraction seen.  Prolonged QT interval -Stabilize electrolytes (goal is for potassium above 4 and magnesium above 2) -Minimize medications that can further prolong QT -Continue telemetry monitoring.  Leukocytosis -Appears to be in the setting of demargination -Patient urinalysis with concern for UTI. -Continue the use of Rocephin and follow urine culture results. -Continue to follow WBCs trend; now WNL 8.3 -Continue to maintain adequate hydration and follow clinical response.   Type 2 diabetes mellitus with hyperglycemia (HCC) -A1c 8.3 -Continue the use of metformin twice a day and long-acting insulin nightly. -Continue to follow CBGs with further adjustment to hypoglycemia regimen as needed -Following modified carbohydrate diet  AKI (acute kidney injury) ([place]) -In the setting of prerenal azotemia most likely -Improved/resolved after fluid resuscitation -continue to follow renal function trend intermittently. -patient advised to maintain adequate hydration.   Hypokalemia -Repleted and is stable at discharge -Magnesium within normal limits -Goal is for  potassium above 4 and magnesium above 2.  Essential hypertension -Blood pressure was intermittently fluctuating  -Continue daily Cozaar; heart healthy/low-sodium diet and the use of Lopressor twice a day. -Reassess blood pressure and further adjust antihypertensive regimen as required.     Consultants: Cardiology service Procedures performed: See below for x-ray report; 2D echo. Disposition: Skilled nursing facility Diet recommendation: Heart healthy/modified carbohydrate diet.   DISCHARGE MEDICATION: Allergies as of 10/19/2021       Reactions   Daypro [oxaprozin]    Indigestion         Medication List     TAKE these medications    Accu-Chek Softclix Lancets lancets Use as instructed   acetaminophen 325 MG tablet Commonly known as: TYLENOL Take 2 tablets (650 mg total) by mouth every 6 (six) hours as needed for mild pain (or Fever >/= 101).   apixaban 5 MG Tabs tablet Commonly known as: ELIQUIS Take 1 tablet (5 mg total) by mouth 2 (two) times daily.   B-D UF III MINI PEN NEEDLES 31G X 5 MM Misc Generic drug: Insulin Pen Needle USE TO ADMINISTER INSULIN  AS DIRECTED   blood glucose meter kit and supplies Dispense based on patient and insurance preference. Use to test blood sugar 3 times a day (FOR ICD-10 E10.9, E11.9).   busPIRone 5 MG tablet Commonly known as: BUSPAR TAKE 1 TABLET(5 MG) BY MOUTH TWICE DAILY FOR ANXIETY What changed: See the new instructions.   cefdinir 300 MG capsule Commonly known as: OMNICEF Take 1 capsule (300 mg total) by mouth every 12 (twelve) hours for 5 days.   citalopram 20 MG tablet Commonly known as: CELEXA TAKE 1 TABLET BY MOUTH  DAILY   glucose blood test strip Commonly known as: ONE TOUCH ULTRA TEST TEST BLOOD SUGAR THREE TIMES DAILY   glucose blood test strip Use as instructed   Insulin Syringes (Disposable) U-100 1 ML Misc Use to administer insulin  Lantus SoloStar 100 UNIT/ML Solostar Pen Generic drug: insulin  glargine Inject 15 Units into the skin at bedtime. What changed: See the new instructions.   levothyroxine 50 MCG tablet Commonly known as: SYNTHROID Take 1 tablet (50 mcg total) by mouth daily at 6 (six) AM. Start taking on: October 20, 2021   losartan 25 MG tablet Commonly known as: COZAAR Take 1 tablet (25 mg total) by mouth daily. Start taking on: October 20, 2021   metFORMIN 1000 MG tablet Commonly known as: GLUCOPHAGE TAKE 1 TABLET BY MOUTH IN  THE MORNING AND ONE-HALF  TABLET BY MOUTH AT NIGHT   metoprolol tartrate 25 MG tablet Commonly known as: LOPRESSOR Take 1 tablet (25 mg total) by mouth 2 (two) times daily.   pantoprazole 40 MG tablet Commonly known as: PROTONIX Take 40 mg by mouth daily.        Contact information for follow-up providers     [facility] [place] Follow up.   Why: Will contact you to schedule home health visits. Contact information: [place] [address] [phone]         Kathyrn Drown, MD. Schedule an appointment as soon as possible for a visit in 2 week(s).   Specialty: Family Medicine Why: After discharge from the skilled nursing facility. Contact information: [place] [address] [phone]              Contact information for after-discharge care     [facility] .   Service: Skilled Nursing Contact information: [place] [phone]                    Discharge Exam: Danley Danker Weights   10/15/21 0500 10/16/21 0500 10/18/21 0422  Weight: 91.7 kg 91.7 kg 91.3 kg   General exam: Alert, awake, oriented x 3, afebrile, no chest pain, no nausea, no vomiting.  Following commands appropriately.  Demonstrating uncontrolled blood pressure throughout the night.  Rate remains stable and patient denies chest pain or palpitations. Respiratory system: Clear to auscultation. Respiratory effort  normal.  Good saturation on room air; no using accessory. Cardiovascular system:RRR. No murmurs, rubs, gallops. Gastrointestinal system: Abdomen is nondistended, soft and nontender. No organomegaly or masses felt. Normal bowel sounds heard. Central nervous system: Alert and oriented. No focal neurological deficits. Extremities: No cyanosis or clubbing. Skin: No petechiae. Psychiatry: Judgement and insight appear normal. Mood & affect appropriate.     Condition at discharge: Stable and improved.  The results of significant diagnostics from this hospitalization (including imaging, microbiology, ancillary and laboratory) are listed below for reference.   Imaging Studies: MR BRAIN WO CONTRAST  Result Date: 10/15/2021 CLINICAL DATA:  Neuro deficit, acute, stroke suspected. EXAM: MRI HEAD WITHOUT CONTRAST TECHNIQUE: Multiplanar, multiecho pulse sequences of the brain and surrounding structures were obtained without intravenous contrast. COMPARISON:  Head CT October 14, 2021. FINDINGS: Brain: No acute infarction, hemorrhage, hydrocephalus, extra-axial collection or mass lesion. 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 Moderate parenchymal volume loss. Scattered confluent foci of T2 hyperintensity within the white matter of the cerebral hemispheres and within pons, nonspecific most likely related to vessel ischemia. Vascular: Normal flow voids. Skull and upper cervical spine: Diffuse decrease of the T1 signal within the visualized bony structures may be related to anemia. However, marrow replacement pathologies cannot be entirely excluded. Sinuses/Orbits: Negative.  Other: None. IMPRESSION: 1. No acute intracranial abnormality. 2. Advanced chronic microvascular ischemic changes the white matter. Small remote infarcts in the genu of the corpus callosum and left side of the pons. 3. Diffuse decrease of the T1 signal within the visualized bony structures may be related to anemia.  However, marrow replacement pathologies cannot be entirely excluded. Electronically Signed   By: Pedro Earls M.D.   On: 10/15/2021 13:06   CT HEAD WO CONTRAST (5MM)  Result Date: 10/14/2021 CLINICAL DATA:  Mental status change with unknown cause. Progressive confusion today EXAM: CT HEAD WITHOUT CONTRAST TECHNIQUE: Contiguous axial images were obtained from the base of the skull through the vertex without intravenous contrast. RADIATION DOSE REDUCTION: This exam was performed according to the departmental dose-optimization program which includes automated exposure control, adjustment of the mA and/or kV according to patient size and/or use of iterative reconstruction technique. COMPARISON:  09/21/2019 FINDINGS: Brain: Significant motion artifact, pathology could be obscured. Unchanged generalized atrophy with ventriculomegaly. Unchanged fairly extensive low-density in the cerebral white matter attributed to chronic small vessel ischemia. No evidence of acute infarct, hemorrhage, hydrocephalus, or mass Vascular: No hyperdense vessel or unexpected calcification. Skull: Hyperostosis interna.  No acute or significant finding Sinuses/Orbits: Bilateral cataract resection.  No acute finding IMPRESSION: 1. Moderate motion artifact. 2. Involutional changes without acute or interval finding. Electronically Signed   By: Jorje Guild M.D.   On: 10/14/2021 18:46   ECHOCARDIOGRAM COMPLETE  Result Date: 10/14/2021    ECHOCARDIOGRAM REPORT   Patient Name:   JUANETTE URIZAR Date of Exam: 10/14/2021 Medical Rec #:  [id number]      Height:       63.0 in Accession #:    [id number]     Weight:       204.6 lb Date of Birth:  February 16, 1935     BSA:          1.952 m Patient Age:    34 years       BP:           156/44 mmHg Patient Gender: F              HR:           80 bpm. Exam Location:  [facility] Procedure: 2D Echo, Cardiac Doppler and Color Doppler Indications:    Atrial flutter  History:        Patient has no  prior history of Echocardiogram examinations.                 Arrythmias:Atrial Fibrillation and Bradycardia; Risk                 Factors:Hypertension, Diabetes and Dyslipidemia.  Sonographer:    Wenda Low Referring Phys: [place]  1. Distal septal hypokinesis . Left ventricular ejection fraction, by estimation, is 50 to 55%. The left ventricle has low normal function. The left ventricle has no regional wall motion abnormalities. There is mild left ventricular hypertrophy. Left ventricular diastolic parameters are indeterminate.  2. Right ventricular systolic function is normal. The right ventricular size is normal. There is normal pulmonary artery systolic pressure.  3. Left atrial size was moderately dilated.  4. The mitral valve is abnormal. Trivial mitral valve regurgitation. No evidence of mitral stenosis.  5. The aortic valve is tricuspid. There is moderate calcification of the aortic valve. Aortic valve regurgitation is not visualized. Aortic valve sclerosis/calcification is present, without any evidence of aortic stenosis.  6. The inferior vena cava is normal in size with greater than 50% respiratory variability, suggesting right atrial pressure of 3 mmHg. FINDINGS  Left Ventricle: Distal septal hypokinesis. Left ventricular ejection fraction, by estimation, is 50 to 55%. The left ventricle has low normal function. The left ventricle has no regional wall motion abnormalities. The left ventricular internal cavity size was normal in size. There is mild left ventricular hypertrophy. Left ventricular diastolic parameters are indeterminate. Right Ventricle: The right ventricular size is normal. No increase in right ventricular wall thickness. Right ventricular systolic function is normal. There is normal pulmonary artery systolic pressure. The tricuspid regurgitant velocity is 1.69 m/s, and  with an assumed right atrial pressure of 3 mmHg, the estimated right ventricular systolic  pressure is 11.0 mmHg. Left Atrium: Left atrial size was moderately dilated. Right Atrium: Right atrial size was normal in size. Pericardium: There is no evidence of pericardial effusion. Mitral Valve: The mitral valve is abnormal. There is moderate thickening of the mitral valve leaflet(s). There is moderate calcification of the mitral valve leaflet(s). Mild mitral annular calcification. Trivial mitral valve regurgitation. No evidence of  mitral valve stenosis. MV peak gradient, 10.6 mmHg. The mean mitral valve gradient is 3.0 mmHg. Tricuspid Valve: The tricuspid valve is normal in structure. Tricuspid valve regurgitation is mild . No evidence of tricuspid stenosis. Aortic Valve: The aortic valve is tricuspid. There is moderate calcification of the aortic valve. Aortic valve regurgitation is not visualized. Aortic valve sclerosis/calcification is present, without any evidence of aortic stenosis. Aortic valve mean gradient measures 5.0 mmHg. Aortic valve peak gradient measures 9.4 mmHg. Aortic valve area, by VTI measures 2.01 cm. Pulmonic Valve: The pulmonic valve was normal in structure. Pulmonic valve regurgitation is not visualized. No evidence of pulmonic stenosis. Aorta: The aortic root is normal in size and structure. Venous: The inferior vena cava is normal in size with greater than 50% respiratory variability, suggesting right atrial pressure of 3 mmHg. IAS/Shunts: No atrial level shunt detected by color flow Doppler.  LEFT VENTRICLE PLAX 2D LVIDd:         4.30 cm     Diastology LVIDs:         3.10 cm     LV e' medial:    4.82 cm/s LV PW:         1.50 cm     LV E/e' medial:  29.7 LV IVS:        1.30 cm     LV e' lateral:   6.13 cm/s LVOT diam:     2.00 cm     LV E/e' lateral: 23.3 LV SV:         59 LV SV Index:   30 LVOT Area:     3.14 cm  LV Volumes (MOD) LV vol d, MOD A2C: 64.9 ml LV vol d, MOD A4C: 74.6 ml LV vol s, MOD A2C: 28.7 ml LV vol s, MOD A4C: 36.4 ml LV SV MOD A2C:     36.2 ml LV SV MOD A4C:      74.6 ml LV SV MOD BP:      37.9 ml RIGHT VENTRICLE RV Basal diam:  3.80 cm RV Mid diam:    3.10 cm RV S prime:     7.62 cm/s TAPSE (M-mode): 1.9 cm LEFT ATRIUM              Index        RIGHT ATRIUM  Index LA diam:        5.00 cm  2.56 cm/m   RA Area:     16.20 cm LA Vol (A2C):   103.0 ml 52.76 ml/m  RA Volume:   39.90 ml  20.44 ml/m LA Vol (A4C):   98.9 ml  50.66 ml/m LA Biplane Vol: 103.0 ml 52.76 ml/m  AORTIC VALVE                     PULMONIC VALVE AV Area (Vmax):    1.88 cm      PV Vmax:       0.75 m/s AV Area (Vmean):   1.66 cm      PV Peak grad:  2.2 mmHg AV Area (VTI):     2.01 cm AV Vmax:           153.00 cm/s AV Vmean:          104.000 cm/s AV VTI:            0.293 m AV Peak Grad:      9.4 mmHg AV Mean Grad:      5.0 mmHg LVOT Vmax:         91.60 cm/s LVOT Vmean:        55.100 cm/s LVOT VTI:          0.187 m LVOT/AV VTI ratio: 0.64  AORTA Ao Root diam: 3.10 cm MITRAL VALVE                TRICUSPID VALVE MV Area (PHT): 4.06 cm     TR Peak grad:   11.4 mmHg MV Area VTI:   1.57 cm     TR Vmax:        169.00 cm/s MV Peak grad:  10.6 mmHg MV Mean grad:  3.0 mmHg     SHUNTS MV Vmax:       1.63 m/s     Systemic VTI:  0.19 m MV Vmean:      76.8 cm/s    Systemic Diam: 2.00 cm MV Decel Time: 187 msec MV E velocity: 143.00 cm/s MV A velocity: 41.70 cm/s MV E/A ratio:  3.43 Jenkins Rouge MD Electronically signed by Jenkins Rouge MD Signature Date/Time: 10/14/2021/3:42:00 PM    Final    DG Chest Port 1 View  Result Date: 10/13/2021 CLINICAL DATA:  10009. Pt having palpitations that started today, seen at Dr. Lance Sell office and sent her for tachycardia. Hx of CHF EXAM: PORTABLE CHEST 1 VIEW COMPARISON:  Chest x-ray 02/03/2018, chest x-ray 09/24/2010 FINDINGS: Cardiac paddles overlie the chest. Stable prominent cardiac silhouette likely exaggerated by portable AP technique. The heart and mediastinal contours are unchanged. Aortic calcification. Biapical pleural/pulmonary scarring. No focal  consolidation. Chronic coarsened markings with no overt pulmonary edema. No pleural effusion. No pneumothorax. No acute osseous abnormality. Degenerative changes of the right shoulder. Redemonstration of a well corticated 1.9 cm calcific density along the left shoulder. IMPRESSION: 1. No active disease. 2.  Aortic Atherosclerosis (ICD10-I70.0). Electronically Signed   By: Iven Finn M.D.   On: 10/13/2021 16:36    Microbiology: Results for orders placed or performed during the hospital encounter of 10/13/21  MRSA Next Gen by PCR, Nasal     Status: Abnormal   Collection Time: 10/14/21 12:57 PM   Specimen: Nasal Mucosa; Nasal Swab  Result Value Ref Range Status   MRSA by PCR Next Gen DETECTED (A) NOT DETECTED Final    Comment: RESULT CALLED TO, READ  BACK BY AND VERIFIED WITH: MCMILLIAN, RN @ 5625 ON 10/14/21 C VARNER (NOTE) The GeneXpert MRSA Assay (FDA approved for NASAL specimens only), is one component of a comprehensive MRSA colonization surveillance program. It is not intended to diagnose MRSA infection nor to guide or monitor treatment for MRSA infections. Test performance is not FDA approved in patients less than 7 years old. Performed at [facility], [address] [place], [place] [address]   Urine Culture     Status: Abnormal   Collection Time: 10/14/21  5:58 PM   Specimen: Urine, Clean Catch  Result Value Ref Range Status   Specimen Description   Final    URINE, CLEAN CATCH Performed at [facility], [address] [place], [place] [address]    Special Requests   Final    Normal Performed at [facility], [address], [place], [place] [address]    Culture >=100,000 COLONIES/mL ESCHERICHIA COLI (A)  Final   Report Status 10/18/2021 FINAL  Final   Organism ID, Bacteria ESCHERICHIA COLI (A)  Final      Susceptibility   Escherichia coli - MIC*    AMPICILLIN >=32 RESISTANT Resistant     CEFAZOLIN <=4 SENSITIVE Sensitive     CEFEPIME <=0.12 SENSITIVE Sensitive      CEFTRIAXONE <=0.25 SENSITIVE Sensitive     CIPROFLOXACIN <=0.25 SENSITIVE Sensitive     GENTAMICIN <=1 SENSITIVE Sensitive     IMIPENEM <=0.25 SENSITIVE Sensitive     NITROFURANTOIN <=16 SENSITIVE Sensitive     TRIMETH/SULFA <=20 SENSITIVE Sensitive     AMPICILLIN/SULBACTAM >=32 RESISTANT Resistant     PIP/TAZO <=4 SENSITIVE Sensitive     * >=100,000 COLONIES/mL ESCHERICHIA COLI    Labs: CBC: Recent Labs  Lab 10/13/21 1556 10/14/21 0401 10/15/21 0441 10/17/21 0441  WBC 11.5* 9.5 11.7* 8.3  HGB 13.7 13.1 12.7 11.8*  HCT 43.0 41.3 39.5 37.6  MCV 92.3 93.9 94.0 94.7  PLT 252 224 202 572   Basic Metabolic Panel: Recent Labs  Lab 10/13/21 1556 10/14/21 0401 10/15/21 0441 10/17/21 0441 10/19/21 0459  NA 139 141 139 138 139  K 4.1 3.4* 3.5 3.9 3.7  CL 110 111 106 104 104  CO2 _0 GLUCOSE 306* 173* 156* 126* 152*  BUN 30* 24* _1 CREATININE 1.17* 0.77 0.94 0.99 0.74  CALCIUM 8.5* 8.1* 8.0* 7.9* 8.5*  MG  --  1.9 1.7  --   --   PHOS  --  2.6  --   --   --    Liver Function Tests: Recent Labs  Lab 10/14/21 0401  AST 20  ALT 35  ALKPHOS 88  BILITOT 0.7  PROT 6.4*  ALBUMIN 3.4*   CBG: Recent Labs  Lab 10/18/21 1128 10/18/21 1610 10/18/21 2100 10/19/21 0716 10/19/21 1116  GLUCAP 228* 161* 178* 150* 230*    Discharge time spent: greater than 30 minutes.  Signed: Barton Dubois, MD [facility] 10/19/2021

## 2021-10-19 NOTE — TOC Transition Note (Signed)
Transition of Care [facility]) - CM/SW Discharge Note   Patient Details  Name: Shelly Jackson MRN: [id number] Date of Birth: [birth year]  [facility] CM/SW Contact:  Ihor Gully, LCSW Phone Number: 10/19/2021, 4:02 PM   Clinical Narrative:    Discharge clinicals sent to facility. Son, Remo Lipps, notified of d/c. RN called report. EMS contacted and patient is on the list for transport.    Final next level of care: [place] Barriers to Discharge: No Barriers Identified   Patient Goals and CMS Choice Patient states their goals for this hospitalization and ongoing recovery are:: home with [facility] CMS Medicare.gov Compare Post Acute Care list provided to:: Patient Choice offered to / list presented to : Patient  Discharge Placement              Patient chooses bed at: [facility] Patient to be transferred to facility by: [place] Name of family member notified: Remo Lipps Patient and family notified of of transfer: 10/19/21  Discharge Plan and Services In-house Referral: Clinical Social Work   Post Acute Care Choice: Home Health                               Social Determinants of Health (SDOH) Interventions     Readmission Risk Interventions     No data to display

## 2021-10-19 NOTE — Assessment & Plan Note (Signed)
-   Patient received treatment with Bactroban and chlorhexidine pads as per decolonization protocol inside hospital.

## 2021-10-19 NOTE — Progress Notes (Signed)
Progress Note  Patient Name: Shelly Jackson Date of Encounter: 10/19/2021  Primary Cardiologist: Dr. Jenkins Rouge  Subjective   Reverted back to afib with RVR overnight. Given IV diltiazem and started on oral metoprolol. Remains on Eliquis for anticoagulation.  Inpatient Medications    Scheduled Meds:  apixaban  5 mg Oral BID   busPIRone  5 mg Oral BID   cefdinir  300 mg Oral Q12H   Chlorhexidine Gluconate Cloth  6 each Topical Q0600   citalopram  20 mg Oral QHS   insulin aspart  0-5 Units Subcutaneous QHS   insulin aspart  0-9 Units Subcutaneous TID WC   insulin glargine-yfgn  12 Units Subcutaneous QHS   levothyroxine  50 mcg Oral Q0600   losartan  25 mg Oral Daily   metoprolol tartrate  25 mg Oral BID   pantoprazole  40 mg Oral Daily   potassium chloride  40 mEq Oral Once   Continuous Infusions:   PRN Meds: acetaminophen **OR** acetaminophen, alum & mag hydroxide-simeth, hydrALAZINE, ipratropium-albuterol   Vital Signs    Vitals:   10/19/21 0240 10/19/21 0333 10/19/21 0512 10/19/21 0859  BP: (!) 185/92 (!) 184/90 (!) 178/68 (!) 127/110  Pulse:  64 (!) 58 (!) 137  Resp:   18 18  Temp:   97.7 F (36.5 C)   TempSrc:   Oral   SpO2:   94%   Weight:      Height:        Intake/Output Summary (Last 24 hours) at 10/19/2021 0932 Last data filed at 10/19/2021 0500 Gross per 24 hour  Intake 340.06 ml  Output 1200 ml  Net -859.94 ml   Filed Weights   10/15/21 0500 10/16/21 0500 10/18/21 0422  Weight: 91.7 kg 91.7 kg 91.3 kg    Telemetry    Afib with RVR - Personally reviewed.  ECG    N/A  Physical Exam   GEN: Awake, NAD Neck: No JVD. Cardiac: irregularly irregular, no murmur, rub, or gallop.  Respiratory: Nonlabored. Clear to auscultation bilaterally. GI: Soft, nontender, bowel sounds present. MS: No edema. Neuro: A&OX3, follows commands  Labs    Chemistry Recent Labs  Lab 10/14/21 0401 10/15/21 0441 10/17/21 0441 10/19/21 0459  NA 141 139  138 139  K 3.4* 3.5 3.9 3.7  CL 111 106 104 104  CO2 '24 26 27 29  '$ GLUCOSE 173* 156* 126* 152*  BUN 24* '20 23 17  '$ CREATININE 0.77 0.94 0.99 0.74  CALCIUM 8.1* 8.0* 7.9* 8.5*  PROT 6.4*  --   --   --   ALBUMIN 3.4*  --   --   --   AST 20  --   --   --   ALT 35  --   --   --   ALKPHOS 88  --   --   --   BILITOT 0.7  --   --   --   GFRNONAA >60 60* 56* >60  ANIONGAP '6 7 7 6     '$ Hematology Recent Labs  Lab 10/14/21 0401 10/15/21 0441 10/17/21 0441  WBC 9.5 11.7* 8.3  RBC 4.40 4.20 3.97  HGB 13.1 12.7 11.8*  HCT 41.3 39.5 37.6  MCV 93.9 94.0 94.7  MCH 29.8 30.2 29.7  MCHC 31.7 32.2 31.4  RDW 14.6 14.6 14.4  PLT 224 202 183    Cardiac Enzymes Recent Labs  Lab 10/13/21 1557 10/13/21 1743  TROPONINIHS 48* 41*    Radiology  No results found.  Assessment & Plan    1.  Atypical atrial flutter presenting with RVR.  CHA2DS2-VASc score is 6.  Spontaneously converted to sinus, now back in afib. Was not on rate-controlling agents d/t bradycardia - now on po lopressor. Agree with regimen. Continue Eliquis.  2.  Acute metabolic encephalopathy, possibly associated UTI.  Brain MRI without acute stroke. This seems to have significantly improved.  3.  Essential hypertension - has not been well-controlled. Now on metoprolol and losartan.  4.  Minor, flat elevation in high-sensitivity troponin I in the 40s most reflective of demand ischemia rather than ACS. Echo showed low normal LVEF 50-55%, mild LVH, moderate LAE. No regional WMA's.  5.  Uncontrolled type 2 diabetes mellitus with hyperglycemia.  Hemoglobin A1c 8.3%.  Pixie Casino, MD, [facility], [place] Director of the [facility] Lipid Disorders &  Cardiovascular [facility] Reduction Clinic Diplomate of the American Board of Clinical Lipidology Attending Cardiologist  Direct Dial: [phone]  Fax: [phone]  Website:  [email or web address]  Pixie Casino, MD  10/19/2021, 9:32 AM

## 2021-10-19 NOTE — Progress Notes (Signed)
Report was given to [place] at [facility] in [place]

## 2021-10-19 NOTE — Care Management Important Message (Signed)
Important Message  Patient Details  Name: Shelly Jackson MRN: [id number] Date of Birth: 1934-09-12   Medicare Important Message Given:  Yes     Tommy Medal 10/19/2021, 1:25 PM

## 2021-10-19 NOTE — Progress Notes (Signed)
Pt is upset that she has not been picked up by EMS to go home. Pt is refusing her insulin and refuses to eat dinner. MD notified.

## 2021-10-19 NOTE — Progress Notes (Signed)
Patient's BP 184/90, pulse 64.Patient sleeping with no signs of distress or discomfort. On call MD notified. 15 mg IV cardizem ordered and RN administered to patient. Patient tolerated well.

## 2021-10-19 NOTE — Progress Notes (Addendum)
Patient's blood pressure elevated. Patient shows no signs of distress or discomfort. She is sleeping with CPAP on. On call notified of blood pressure. Manual BP cuff used to recheck: 198/90. PRN hydralazine is in [facility] with no parameters. On call MD messaged RN to give '10mg'$  hydralazine IV and RN administered.   10/19/21 0100  Vitals  Temp 97.9 F (36.6 C)  Temp Source Oral  BP (!) 191/90  MAP (mmHg) 117  BP Location Right Arm  BP Method Automatic  Patient Position (if appropriate) Lying  Pulse Rate (!) 57  Pulse Rate Source Dinamap  Resp 18  Level of Consciousness  Level of Consciousness Alert  MEWS COLOR  MEWS Score Color Green  Oxygen Therapy  SpO2 96 %  O2 Device CPAP  MEWS Score  MEWS Temp 0  MEWS Systolic 0  MEWS Pulse 0  MEWS RR 0  MEWS LOC 0  MEWS Score 0

## 2021-10-26 ENCOUNTER — Other Ambulatory Visit: Payer: Self-pay | Admitting: *Deleted

## 2021-10-26 ENCOUNTER — Other Ambulatory Visit: Payer: Self-pay

## 2021-10-26 DIAGNOSIS — R000 Tachycardia, unspecified: Secondary | ICD-10-CM

## 2021-10-26 DIAGNOSIS — I4892 Unspecified atrial flutter: Secondary | ICD-10-CM

## 2021-10-26 DIAGNOSIS — E133399 Other specified diabetes mellitus with moderate nonproliferative diabetic retinopathy without macular edema, unspecified eye: Secondary | ICD-10-CM

## 2021-10-27 ENCOUNTER — Telehealth: Payer: Self-pay | Admitting: *Deleted

## 2021-10-27 NOTE — Chronic Care Management (AMB) (Unsigned)
  Chronic Care Management Note  10/27/2021 Name: Shelly Jackson MRN: [id number] DOB: [birth year]  Shelly Jackson is a 87 y.o. year old female who is a primary care patient of Luking, Elayne Snare, MD and is actively engaged with the care management team. I reached out to [name] by phone today to assist with scheduling a follow up visit with the Licensed Clinical Social Worker  Follow up plan:  A telephone outreach attempt made. The care management team will reach out to the patient again over the next 1 day. If patient returns call to provider office, please advise to call [place] at [phone].  [facility]  Direct Dial: [phone]

## 2021-10-27 NOTE — Telephone Encounter (Signed)
Sure will, no trouble at all

## 2021-10-27 NOTE — Telephone Encounter (Signed)
Shelly Jackson at [facility] of md message. Verbalized understanding.

## 2021-10-27 NOTE — Telephone Encounter (Signed)
Home Health is wanting to know if you will sign off on orders for the pt.

## 2021-10-27 NOTE — Telephone Encounter (Signed)
Patient had referral for home healthcare. Verline Lema from [facility] would like to know if you would follow up on pt's home health care. Please advise. Thank you   [phone]

## 2021-10-27 NOTE — Telephone Encounter (Signed)
Not quite sure if I understand what is being asked  As for will be signed orders or papers regarding her referral-sure Patient was recently in the hospital was discharged to home she would benefit from home health services

## 2021-10-29 ENCOUNTER — Other Ambulatory Visit: Payer: Self-pay

## 2021-10-29 ENCOUNTER — Other Ambulatory Visit: Payer: Self-pay | Admitting: *Deleted

## 2021-10-29 MED ORDER — BLOOD GLUCOSE METER KIT: PACK | 0 refills | Status: AC

## 2021-10-29 MED ORDER — ACCU-CHEK SOFTCLIX LANCETS MISC: 1 refills | Status: AC

## 2021-10-29 MED ORDER — GLUCOSE BLOOD VI STRP: ORAL_STRIP | 1 refills | Status: AC

## 2021-11-04 ENCOUNTER — Ambulatory Visit (INDEPENDENT_AMBULATORY_CARE_PROVIDER_SITE_OTHER): Payer: Medicare Other | Admitting: Family Medicine

## 2021-11-04 VITALS — BP 128/70 | HR 50 | Temp 97.3°F | Ht 63.0 in | Wt 194.0 lb

## 2021-11-04 DIAGNOSIS — D72829 Elevated white blood cell count, unspecified: Secondary | ICD-10-CM | POA: Diagnosis not present

## 2021-11-04 DIAGNOSIS — N289 Disorder of kidney and ureter, unspecified: Secondary | ICD-10-CM

## 2021-11-04 DIAGNOSIS — I10 Essential (primary) hypertension: Secondary | ICD-10-CM | POA: Diagnosis not present

## 2021-11-04 MED ORDER — METFORMIN HCL 1000 MG PO TABS: ORAL_TABLET | ORAL | 3 refills | Status: DC

## 2021-11-04 NOTE — Progress Notes (Signed)
   Subjective:    Patient ID: Shelly Jackson, female    DOB: 06-Oct-1934, 87 y.o.   MRN: [id number]  HPI Hospital follow up for atrial flutter , no concerns voiced Patient here today after being hospitalized for atrial flutter and atrial fibrillation She also had some delirium and urinary tract infection while in the hospital but she is doing better now she is here today with church friends who brought her she did not drive She states she is using her walker she does get a little out of breath with moving around but denies any type of chest pressure tightness pain denies any significant swelling in the legs brings her medicines in for review we reviewed each one of them discussed the purpose of the medicine and even wrote that down for her  Review of Systems     Objective:   Physical Exam Heart is regular currently pulse normal heart rate controlled no murmurs lungs are clear no crackles extremities no edema       Assessment & Plan:  1. Renal insufficiency We look at kidney function.  Healthy diet recommended if increasing shortness of breath or swelling follow-up immediately - Basic metabolic panel - CBC with Differential/Platelet  2. Leukocytosis, unspecified type Was elevated while in the hospital should be doing better now - Basic metabolic panel - CBC with Differential/Platelet  3. Essential hypertension Blood pressure doing fine currently - Basic metabolic panel - CBC with Differential/Platelet  Atrial flutter doing better.  By exam appears to be in normal sinus rhythm.  Follow-up in 3 months sooner if any problems

## 2021-11-05 LAB — BASIC METABOLIC PANEL
BUN/Creatinine Ratio: 22 (ref 12–28)
BUN: 20 mg/dL (ref 8–27)
CO2: 21 mmol/L (ref 20–29)
Calcium: 8.6 mg/dL — ABNORMAL LOW (ref 8.7–10.3)
Chloride: 105 mmol/L (ref 96–106)
Creatinine, Ser: 0.91 mg/dL (ref 0.57–1.00)
Glucose: 188 mg/dL — ABNORMAL HIGH (ref 70–99)
Potassium: 4.3 mmol/L (ref 3.5–5.2)
Sodium: 144 mmol/L (ref 134–144)
eGFR: 62 mL/min/{1.73_m2} (ref >59)

## 2021-11-05 LAB — CBC WITH DIFFERENTIAL/PLATELET
Basophils Absolute: 0 10*3/uL (ref 0.0–0.2)
Basos: 0 %
EOS (ABSOLUTE): 0.1 10*3/uL (ref 0.0–0.4)
Eos: 1 %
Hematocrit: 40.7 % (ref 34.0–46.6)
Hemoglobin: 13 g/dL (ref 11.1–15.9)
Immature Grans (Abs): 0 10*3/uL (ref 0.0–0.1)
Immature Granulocytes: 0 %
Lymphocytes Absolute: 1.8 10*3/uL (ref 0.7–3.1)
Lymphs: 21 %
MCH: 29.6 pg (ref 26.6–33.0)
MCHC: 31.9 g/dL (ref 31.5–35.7)
MCV: 93 fL (ref 79–97)
Monocytes Absolute: 0.4 10*3/uL (ref 0.1–0.9)
Monocytes: 5 %
Neutrophils Absolute: 6.2 10*3/uL (ref 1.4–7.0)
Neutrophils: 73 %
Platelets: 183 10*3/uL (ref 150–450)
RBC: 4.39 x10E6/uL (ref 3.77–5.28)
RDW: 13.4 % (ref 11.7–15.4)
WBC: 8.5 10*3/uL (ref 3.4–10.8)

## 2021-11-09 ENCOUNTER — Ambulatory Visit (INDEPENDENT_AMBULATORY_CARE_PROVIDER_SITE_OTHER): Payer: Medicare Other | Admitting: *Deleted

## 2021-11-09 NOTE — Progress Notes (Signed)
This encounter was created in error - please disregard.

## 2021-11-09 NOTE — Addendum Note (Signed)
Addended by: Vern Claude on: 11/09/2021 11:21 AM   Modules accepted: Level of Service

## 2021-11-09 NOTE — Addendum Note (Signed)
Addended by: Vern Claude on: 11/09/2021 11:04 AM   Modules accepted: Orders, Level of Service

## 2021-11-09 NOTE — Chronic Care Management (AMB) (Signed)
  Care Management   Follow Up Note   11/09/2021 Name: Shelly Jackson MRN: [id number] DOB: Sep 15, 1934   Referred by: Kathyrn Drown, MD Reason for referral : Chronic Care Management   Successful contact was made with the patient to discuss care management and care coordination services. Patient declines engagement at this time.   Patient confirms that she currently receives [facility] services through [facility]. She resides with her son who assists with her care. Patient declines need for out of home care at this time and has verbalized no additional community resource needs at this time.  Follow Up Plan: No further follow up required: patient to contact her provider's office with any additional community resource needs.  [place], [place] [phone]

## 2021-11-09 NOTE — Addendum Note (Signed)
Addended by: Vern Claude on: 11/09/2021 11:17 AM   Modules accepted: Level of Service

## 2021-11-17 ENCOUNTER — Telehealth: Payer: Self-pay

## 2021-11-17 NOTE — Telephone Encounter (Signed)
Recommend follow-up office visit within the next 2  weeks with myself or Leona Patient to bring medications with her

## 2021-11-17 NOTE — Telephone Encounter (Signed)
BP today 160/78 had not yet taken her meds, while OT was present , PT rechecked 2 hrs later 177/74 after her meds, denies any other concerning symptoms , PT specialist states patient takes her own medications and is not sure if there is enough oversight . Advised an appt may be needed .Please advise

## 2021-11-17 NOTE — Telephone Encounter (Signed)
Tried to call pt, vm not set up

## 2021-11-18 ENCOUNTER — Other Ambulatory Visit: Payer: Self-pay | Admitting: Family Medicine

## 2021-11-18 NOTE — Telephone Encounter (Signed)
Spoke with patient and informed her per drs recommendations, pt states will call back to schedule the appt.

## 2021-11-26 ENCOUNTER — Other Ambulatory Visit: Payer: Self-pay

## 2021-11-26 DIAGNOSIS — E1142 Type 2 diabetes mellitus with diabetic polyneuropathy: Secondary | ICD-10-CM

## 2021-11-26 MED ORDER — BD PEN NEEDLE MINI U/F 31G X 5 MM MISC: 1 refills | Status: AC

## 2021-11-29 DIAGNOSIS — E1165 Type 2 diabetes mellitus with hyperglycemia: Secondary | ICD-10-CM

## 2021-11-29 DIAGNOSIS — G9341 Metabolic encephalopathy: Secondary | ICD-10-CM

## 2021-11-29 DIAGNOSIS — E113512 Type 2 diabetes mellitus with proliferative diabetic retinopathy with macular edema, left eye: Secondary | ICD-10-CM

## 2021-11-29 DIAGNOSIS — E039 Hypothyroidism, unspecified: Secondary | ICD-10-CM

## 2021-11-29 DIAGNOSIS — I10 Essential (primary) hypertension: Secondary | ICD-10-CM

## 2021-11-29 DIAGNOSIS — E1142 Type 2 diabetes mellitus with diabetic polyneuropathy: Secondary | ICD-10-CM

## 2021-11-29 DIAGNOSIS — I4892 Unspecified atrial flutter: Secondary | ICD-10-CM

## 2021-12-01 ENCOUNTER — Encounter: Payer: Self-pay | Admitting: Family Medicine

## 2021-12-01 ENCOUNTER — Ambulatory Visit (INDEPENDENT_AMBULATORY_CARE_PROVIDER_SITE_OTHER): Payer: Medicare Other | Admitting: Family Medicine

## 2021-12-01 VITALS — BP 132/74 | HR 52 | Temp 98.1°F | Ht 63.0 in | Wt 198.0 lb

## 2021-12-01 DIAGNOSIS — N289 Disorder of kidney and ureter, unspecified: Secondary | ICD-10-CM | POA: Diagnosis not present

## 2021-12-01 DIAGNOSIS — I4892 Unspecified atrial flutter: Secondary | ICD-10-CM | POA: Diagnosis not present

## 2021-12-01 DIAGNOSIS — E1142 Type 2 diabetes mellitus with diabetic polyneuropathy: Secondary | ICD-10-CM | POA: Diagnosis not present

## 2021-12-01 DIAGNOSIS — I10 Essential (primary) hypertension: Secondary | ICD-10-CM | POA: Diagnosis not present

## 2021-12-01 MED ORDER — PANTOPRAZOLE SODIUM 40 MG PO TBEC: 40.0000 mg | DELAYED_RELEASE_TABLET | Freq: Every day | ORAL | 1 refills | Status: AC

## 2021-12-01 MED ORDER — APIXABAN 5 MG PO TABS: 5.0000 mg | ORAL_TABLET | Freq: Two times a day (BID) | ORAL | 2 refills | Status: AC

## 2021-12-01 MED ORDER — LEVOTHYROXINE SODIUM 50 MCG PO TABS: 50.0000 ug | ORAL_TABLET | Freq: Every day | ORAL | 2 refills | Status: AC

## 2021-12-01 MED ORDER — METFORMIN HCL 1000 MG PO TABS
ORAL_TABLET | ORAL | 3 refills | Status: AC
Start: 2021-12-01 — End: ?

## 2021-12-01 NOTE — Progress Notes (Signed)
   Subjective:    Patient ID: Shelly Jackson, female    DOB: 1935/03/23, 87 y.o.   MRN: [id number]  Hypertension This is a chronic problem. Treatments tried: losartan, metoprolol.  Diabetes She presents for her follow-up diabetic visit. She has type 2 diabetes mellitus. Current diabetic treatments: lantus, metformin.   Patient not currently taking losartan or metoprolol She brings in her medications today She does not have losartan or metoprolol with her medicines But at the same time she has 3 bottles of metformin She does not have insulin and states she has not been taking insulin for quite some time It makes it quite challenging taking care of her because she is not 100% on top of her medicines We did discuss these in detail today    Review of Systems     Objective:   Physical Exam Heart irregular but controlled pulse normal extremities no edema skin warm dry blood pressure good       Assessment & Plan:  Subpar social situation she lives with her son but she states he does not provide much assistance She has some confusion regarding her medications we went over all of these today and even wrote them out for her Her condition is gradually deteriorating and she will get to the point where she will be homebound or assisted living Diabetes stable currently she states she is not taking insulin shots We will follow her up again within 4 to 6 weeks Blood pressure stable I wrote out all of her medicines on the sheet I went over these with her

## 2021-12-01 NOTE — Progress Notes (Signed)
Please mail to the patient thank you 

## 2021-12-02 ENCOUNTER — Telehealth: Payer: Self-pay | Admitting: *Deleted

## 2021-12-02 NOTE — Telephone Encounter (Signed)
Pt contacted and verbalized understanding.  

## 2021-12-02 NOTE — Progress Notes (Signed)
Pt made aware to stop Celexa; pt verbalized understanding

## 2021-12-02 NOTE — Telephone Encounter (Signed)
[place] with [facility] called with FYI: Patient had fall last night and hit right cheek on TV stand. EMS was activated and came to the house but patient was not transported to ER. Patient has some swelling to the right cheek but reports no other injuries

## 2021-12-02 NOTE — Progress Notes (Signed)
Per [facility], insulin was called in 10/27/21 but was never picked up. Please advise. Thank you

## 2021-12-02 NOTE — Telephone Encounter (Signed)
Sorry to hear this Certainly to follow-up in the next couple days if having any progressive troubles these let me know we can work her in

## 2021-12-08 ENCOUNTER — Telehealth: Payer: Self-pay | Admitting: Family Medicine

## 2021-12-08 NOTE — Telephone Encounter (Signed)
[facility] calling to inform provider that they will be discharging patient due to unsafe home environment. They have contacted APS multiple times. Cecille Rubin states pt has feces through out the home and she is covered in bowel movement from top of buttock to bottom of feet when they go in. Cecille Rubin states son stays on couch and doesn't do anything to help. Staff feels she may not be taking medication correctly. Cecille Rubin states they do not feel they can provide the services that pt needs. Cecille Rubin states patient told the clinician that "if I want to have sh** all over me, I will". When clinician asked if pt informed PCP of diarrhea, she said No. Cecille Rubin would like to finish up discharge tomorrow but has to have it documented that PCP is aware and understands. Cecille Rubin would like a call back once PCP has been made aware.  [phone].  Please advise. Thank you

## 2021-12-10 NOTE — Telephone Encounter (Signed)
Tried calling this nurse and received the answering service we will try again on Monday

## 2022-01-08 NOTE — Telephone Encounter (Signed)
Patient passed away at home, failed unresponsive on August 11, 2022 morning EMS was called.  Code was run.  Was pronounced 11:30 AM on Aug 11, 2022.

## 2022-01-08 NOTE — Telephone Encounter (Signed)
I did discuss condolences to her son Lennette Bihari Her chart may be marked as deceased she died this morning 11:30 AM was a time the code was called

## 2022-01-08 DEATH — deceased

## 2022-02-04 ENCOUNTER — Ambulatory Visit: Payer: Medicare Other | Admitting: Family Medicine

## 2022-03-04 ENCOUNTER — Ambulatory Visit: Payer: Medicare Other | Admitting: Family Medicine

## 2022-12-15 NOTE — Progress Notes (Signed)
This encounter was created in error - please disregard.
